# Patient Record
Sex: Male | Born: 1950 | Race: White | Hispanic: No | Smoking: Current every day smoker
Health system: Southern US, Community
[De-identification: ages and names within clinical notes are randomized; demographics above are authoritative.]

## PROBLEM LIST (undated history)

## (undated) DIAGNOSIS — H9190 Unspecified hearing loss, unspecified ear: Secondary | ICD-10-CM

## (undated) DIAGNOSIS — E785 Hyperlipidemia, unspecified: Secondary | ICD-10-CM

## (undated) DIAGNOSIS — R7303 Prediabetes: Secondary | ICD-10-CM

## (undated) DIAGNOSIS — K859 Acute pancreatitis without necrosis or infection, unspecified: Secondary | ICD-10-CM

## (undated) DIAGNOSIS — C259 Malignant neoplasm of pancreas, unspecified: Secondary | ICD-10-CM

## (undated) DIAGNOSIS — K219 Gastro-esophageal reflux disease without esophagitis: Secondary | ICD-10-CM

## (undated) HISTORY — DX: Hyperlipidemia, unspecified: E78.5

## (undated) HISTORY — PX: TONSILLECTOMY: SUR1361

## (undated) HISTORY — PX: APPENDECTOMY: SHX54

## (undated) HISTORY — DX: Acute pancreatitis without necrosis or infection, unspecified: K85.90

## (undated) HISTORY — PX: COLONOSCOPY: SHX174

## (undated) HISTORY — DX: Malignant neoplasm of pancreas, unspecified: C25.9

## (undated) HISTORY — PX: POLYPECTOMY: SHX149

---

## 2004-09-19 ENCOUNTER — Ambulatory Visit (HOSPITAL_COMMUNITY): Admission: RE | Admit: 2004-09-19 | Discharge: 2004-09-19 | Payer: Self-pay | Admitting: Family Medicine

## 2004-10-12 ENCOUNTER — Ambulatory Visit: Payer: Self-pay | Admitting: Gastroenterology

## 2004-10-26 ENCOUNTER — Ambulatory Visit: Payer: Self-pay | Admitting: Gastroenterology

## 2007-03-07 ENCOUNTER — Ambulatory Visit (HOSPITAL_COMMUNITY): Admission: RE | Admit: 2007-03-07 | Discharge: 2007-03-07 | Payer: Self-pay | Admitting: Family Medicine

## 2009-11-10 ENCOUNTER — Encounter (INDEPENDENT_AMBULATORY_CARE_PROVIDER_SITE_OTHER): Payer: Self-pay | Admitting: *Deleted

## 2009-11-30 ENCOUNTER — Ambulatory Visit (HOSPITAL_COMMUNITY): Admission: RE | Admit: 2009-11-30 | Discharge: 2009-11-30 | Payer: Self-pay | Admitting: Family Medicine

## 2010-07-19 NOTE — Letter (Signed)
Summary: Colonoscopy Date Change Letter  [facility]  [address] [place], [place] [address]   Phone: [phone]  Fax: [phone]      Nov 10, 2009 MRN: [id number]   Jon Pena [address] [place], [place]  [address]   Dear Jon Pena,   Previously you were recommended to have a repeat colonoscopy around this time. Your chart was recently reviewed by Dr. Sheryn Bison of [facility]. Follow up colonoscopy is now recommended in May 2016. This revised recommendation is based on current, nationally recognized guidelines for colorectal cancer screening and polyp surveillance. These guidelines are endorsed by the American Cancer Society, [employer] on Colorectal Cancer as well as numerous other major medical organizations.  Please understand that our recommendation assumes that you do not have any new symptoms such as bleeding, a change in bowel habits, anemia, or significant abdominal discomfort. If you do have any concerning GI symptoms or want to discuss the guideline recommendations, please call to arrange an office visit at your earliest convenience. Otherwise we will keep you in our reminder system and contact you 1-2 months prior to the date listed above to schedule your next colonoscopy.  Thank you,    Vania Rea. Jarold Motto, M.D.  [facility] [phone]

## 2012-04-06 ENCOUNTER — Ambulatory Visit (INDEPENDENT_AMBULATORY_CARE_PROVIDER_SITE_OTHER): Payer: BC Managed Care – PPO | Admitting: Physician Assistant

## 2012-04-06 VITALS — BP 132/92 | HR 84 | Temp 98.2°F | Resp 17 | Ht 71.0 in | Wt 216.0 lb

## 2012-04-06 DIAGNOSIS — Z4802 Encounter for removal of sutures: Secondary | ICD-10-CM

## 2012-04-06 NOTE — Progress Notes (Signed)
   Patient ID: Jon Pena MRN: [id number], DOB: [birth year] 61 y.o. Date of Encounter: 04/06/2012, 5:30 PM  Primary Physician: No primary provider on file.  Chief Complaint: Suture removal     HPI: 61 y.o. y/o male with injury to left proximal forearm.  Here for suture removal s/p placement on 03/17/12 at [facility]. Patient unfortunately suffered a laceration to the left forearm by a chainsaw on 03/17/12. He was airlifted by [employer] to [facility]. Upon arrival to [facility] it was found that he did not have involvement of any deep structures. He has full range of motion of the affected extremity, full strength, and normal sensation. He even went to play golf on 04/05/12 and "shot a 75." There were 11 simple interrupted Ethilon sutures placed. No subcutaneous sutures were placed. Tetanus up to date.  Doing well No issues/complaints Afebrile/ No chills No erythema No pain Able to move without difficulty Normal sensation  No past medical history on file.   Home Meds: Prior to Admission medications   Medication Sig Start Date End Date Taking? Authorizing Provider  cephALEXin (KEFLEX) 500 MG capsule Take 500 mg by mouth 4 (four) times daily.   Yes Historical Provider, MD    Allergies: No Known Allergies  Physical Exam: Blood pressure 132/92, pulse 84, temperature 98.2 F (36.8 C), temperature source Oral, resp. rate 17, height 5\' 11"  (1.803 m), weight 216 lb (97.977 kg), SpO2 96.00%., Body mass index is 30.13 kg/(m^2). General: Well developed, well nourished, in no acute distress. Head: Normocephalic, atraumatic, sclera non-icteric, no xanthomas, nares are without discharge.  Neck: Supple. Lungs: Breathing is unlabored. Heart: Normal rate. Msk:  Strength and tone appear normal for age. Wound: Wound well healed without erythema, swelling, or tenderness to palpation. FROM and 5/5 strength with normal sensation throughout including 2 point discrimination. Skin:  See above, otherwise dry without rash or erythema. Extremities: No clubbing or cyanosis. No edema. Neuro: Alert and oriented X 3. Moves all extremities spontaneously.  Psych:  Responds to questions appropriately with a normal affect.   PROCEDURE: Verbal consent obtained. 11 simple interrupted sutures removed without difficulty.  Assessment and Plan: 61 y.o. y/o male here for suture removal for wound described above. -Sutures removed per above -Wound resolved -RTC prn  Signed, Eula Listen, PA-C 04/06/2012 5:30 PM

## 2013-01-26 ENCOUNTER — Emergency Department (HOSPITAL_COMMUNITY): Payer: BC Managed Care – PPO | Admitting: Anesthesiology

## 2013-01-26 ENCOUNTER — Encounter (HOSPITAL_COMMUNITY): Payer: Self-pay | Admitting: *Deleted

## 2013-01-26 ENCOUNTER — Encounter (HOSPITAL_COMMUNITY)
Admission: EM | Disposition: A | Discharge status: Home / Self Care | Payer: Self-pay | Source: Home / Self Care | Attending: Emergency Medicine

## 2013-01-26 ENCOUNTER — Encounter (HOSPITAL_COMMUNITY): Payer: Self-pay | Admitting: Anesthesiology

## 2013-01-26 ENCOUNTER — Emergency Department (HOSPITAL_COMMUNITY): Payer: BC Managed Care – PPO

## 2013-01-26 ENCOUNTER — Observation Stay (HOSPITAL_COMMUNITY)
Admission: EM | Admit: 2013-01-26 | Discharge: 2013-01-27 | Disposition: A | Discharge status: Home / Self Care | Payer: BC Managed Care – PPO | Attending: General Surgery | Admitting: General Surgery

## 2013-01-26 DIAGNOSIS — K3580 Unspecified acute appendicitis: Secondary | ICD-10-CM

## 2013-01-26 DIAGNOSIS — K429 Umbilical hernia without obstruction or gangrene: Secondary | ICD-10-CM | POA: Insufficient documentation

## 2013-01-26 DIAGNOSIS — K8689 Other specified diseases of pancreas: Secondary | ICD-10-CM

## 2013-01-26 DIAGNOSIS — R1031 Right lower quadrant pain: Secondary | ICD-10-CM | POA: Insufficient documentation

## 2013-01-26 DIAGNOSIS — K869 Disease of pancreas, unspecified: Secondary | ICD-10-CM | POA: Insufficient documentation

## 2013-01-26 HISTORY — PX: LAPAROSCOPIC APPENDECTOMY: SHX408

## 2013-01-26 LAB — COMPREHENSIVE METABOLIC PANEL
Albumin: 3.8 g/dL (ref 3.5–5.2)
Alkaline Phosphatase: 68 U/L (ref 39–117)
Creatinine, Ser: 0.88 mg/dL (ref 0.50–1.35)
GFR calc Af Amer: >90 mL/min (ref >90)
GFR calc non Af Amer: >90 mL/min (ref >90)
Glucose, Bld: 130 mg/dL — ABNORMAL HIGH (ref 70–99)
Potassium: 4.2 mEq/L (ref 3.5–5.1)
Sodium: 137 mEq/L (ref 135–145)
Total Bilirubin: 0.4 mg/dL (ref 0.3–1.2)

## 2013-01-26 LAB — URINALYSIS, ROUTINE W REFLEX MICROSCOPIC
Glucose, UA: NEGATIVE mg/dL
Hgb urine dipstick: NEGATIVE
Protein, ur: NEGATIVE mg/dL
Urobilinogen, UA: 0.2 mg/dL (ref 0.0–1.0)

## 2013-01-26 LAB — CBC WITH DIFFERENTIAL/PLATELET
Hemoglobin: 16.7 g/dL (ref 13.0–17.0)
MCH: 31.5 pg (ref 26.0–34.0)
Monocytes Absolute: 0.9 10*3/uL (ref 0.1–1.0)
Platelets: 196 10*3/uL (ref 150–400)
RBC: 5.3 MIL/uL (ref 4.22–5.81)

## 2013-01-26 SURGERY — APPENDECTOMY, LAPAROSCOPIC
Anesthesia: General | Wound class: Contaminated

## 2013-01-26 MED ORDER — HEPARIN SODIUM (PORCINE) 5000 UNIT/ML IJ SOLN
5000.0000 [IU] | Freq: Three times a day (TID) | INTRAMUSCULAR | Status: DC
  Administered 2013-01-26 – 2013-01-27 (×2): 5000 [IU] via SUBCUTANEOUS
  Filled 2013-01-26 (×5): qty 1

## 2013-01-26 MED ORDER — ROCURONIUM BROMIDE 100 MG/10ML IV SOLN
INTRAVENOUS | Status: DC | PRN
  Administered 2013-01-26: 40 mg via INTRAVENOUS

## 2013-01-26 MED ORDER — GLYCOPYRROLATE 0.2 MG/ML IJ SOLN
INTRAMUSCULAR | Status: DC | PRN
  Administered 2013-01-26: 0.6 mg via INTRAVENOUS

## 2013-01-26 MED ORDER — IOHEXOL 300 MG/ML  SOLN
25.0000 mL | INTRAMUSCULAR | Status: DC | PRN
  Administered 2013-01-26: 25 mL via ORAL

## 2013-01-26 MED ORDER — ONDANSETRON HCL 4 MG/2ML IJ SOLN
INTRAMUSCULAR | Status: DC | PRN
  Administered 2013-01-26: 4 mg via INTRAVENOUS

## 2013-01-26 MED ORDER — HYDROMORPHONE HCL PF 1 MG/ML IJ SOLN
1.0000 mg | Freq: Once | INTRAMUSCULAR | Status: AC
  Administered 2013-01-26: 1 mg via INTRAVENOUS
  Filled 2013-01-26: qty 1

## 2013-01-26 MED ORDER — ALBUMIN HUMAN 5 % IV SOLN
INTRAVENOUS | Status: DC | PRN
  Administered 2013-01-26: 11:00:00 via INTRAVENOUS

## 2013-01-26 MED ORDER — NEOSTIGMINE METHYLSULFATE 1 MG/ML IJ SOLN
INTRAMUSCULAR | Status: DC | PRN
  Administered 2013-01-26: 4 mg via INTRAVENOUS

## 2013-01-26 MED ORDER — SODIUM CHLORIDE 0.9 % IV SOLN: INTRAVENOUS | Status: DC

## 2013-01-26 MED ORDER — FENTANYL CITRATE 0.05 MG/ML IJ SOLN
INTRAMUSCULAR | Status: DC | PRN
  Administered 2013-01-26: 50 ug via INTRAVENOUS
  Administered 2013-01-26: 100 ug via INTRAVENOUS

## 2013-01-26 MED ORDER — IOHEXOL 300 MG/ML  SOLN
125.0000 mL | Freq: Once | INTRAMUSCULAR | Status: AC | PRN
  Administered 2013-01-26: 125 mL via INTRAVENOUS

## 2013-01-26 MED ORDER — LACTATED RINGERS IV SOLN
INTRAVENOUS | Status: DC | PRN
  Administered 2013-01-26 (×2): via INTRAVENOUS

## 2013-01-26 MED ORDER — PHENYLEPHRINE HCL 10 MG/ML IJ SOLN
INTRAMUSCULAR | Status: DC | PRN
  Administered 2013-01-26 (×5): 80 ug via INTRAVENOUS

## 2013-01-26 MED ORDER — SODIUM CHLORIDE 0.9 % IV BOLUS (SEPSIS)
1000.0000 mL | Freq: Once | INTRAVENOUS | Status: AC
  Administered 2013-01-26: 1000 mL via INTRAVENOUS

## 2013-01-26 MED ORDER — ONDANSETRON HCL 4 MG/2ML IJ SOLN: 4.0000 mg | Freq: Four times a day (QID) | INTRAMUSCULAR | Status: DC | PRN

## 2013-01-26 MED ORDER — LIDOCAINE HCL (CARDIAC) 20 MG/ML IV SOLN
INTRAVENOUS | Status: DC | PRN
  Administered 2013-01-26: 50 mg via INTRAVENOUS

## 2013-01-26 MED ORDER — MIDAZOLAM HCL 5 MG/5ML IJ SOLN
INTRAMUSCULAR | Status: DC | PRN
  Administered 2013-01-26: 2 mg via INTRAVENOUS

## 2013-01-26 MED ORDER — SODIUM CHLORIDE 0.9 % IR SOLN
Status: DC | PRN
  Administered 2013-01-26: 1000 mL

## 2013-01-26 MED ORDER — SUCCINYLCHOLINE CHLORIDE 20 MG/ML IJ SOLN
INTRAMUSCULAR | Status: DC | PRN
  Administered 2013-01-26: 120 mg via INTRAVENOUS

## 2013-01-26 MED ORDER — IBUPROFEN 600 MG PO TABS
600.0000 mg | ORAL_TABLET | Freq: Four times a day (QID) | ORAL | Status: DC | PRN
  Filled 2013-01-26: qty 1

## 2013-01-26 MED ORDER — PROPOFOL 10 MG/ML IV BOLUS
INTRAVENOUS | Status: DC | PRN
  Administered 2013-01-26: 180 mg via INTRAVENOUS

## 2013-01-26 MED ORDER — BUPIVACAINE-EPINEPHRINE PF 0.25-1:200000 % IJ SOLN
INTRAMUSCULAR | Status: AC
  Filled 2013-01-26: qty 30

## 2013-01-26 MED ORDER — OXYCODONE-ACETAMINOPHEN 5-325 MG PO TABS
ORAL_TABLET | ORAL | Status: AC
  Filled 2013-01-26: qty 2

## 2013-01-26 MED ORDER — ONDANSETRON HCL 4 MG PO TABS: 4.0000 mg | ORAL_TABLET | Freq: Four times a day (QID) | ORAL | Status: DC | PRN

## 2013-01-26 MED ORDER — KETOROLAC TROMETHAMINE 30 MG/ML IJ SOLN
INTRAMUSCULAR | Status: DC | PRN
  Administered 2013-01-26: 30 mg via INTRAVENOUS

## 2013-01-26 MED ORDER — SODIUM CHLORIDE 0.9 % IV SOLN
1.0000 g | Freq: Once | INTRAVENOUS | Status: AC
  Administered 2013-01-26: 1 g via INTRAVENOUS
  Filled 2013-01-26: qty 1

## 2013-01-26 MED ORDER — MORPHINE SULFATE 2 MG/ML IJ SOLN
2.0000 mg | INTRAMUSCULAR | Status: DC | PRN
  Administered 2013-01-27 (×2): 2 mg via INTRAVENOUS
  Filled 2013-01-26 (×2): qty 1

## 2013-01-26 MED ORDER — EPHEDRINE SULFATE 50 MG/ML IJ SOLN
INTRAMUSCULAR | Status: DC | PRN
  Administered 2013-01-26: 10 mg via INTRAVENOUS

## 2013-01-26 MED ORDER — OXYCODONE-ACETAMINOPHEN 5-325 MG PO TABS
1.0000 | ORAL_TABLET | ORAL | Status: DC | PRN
  Administered 2013-01-26: 1 via ORAL
  Administered 2013-01-26 – 2013-01-27 (×3): 2 via ORAL
  Filled 2013-01-26 (×4): qty 2

## 2013-01-26 MED ORDER — BUPIVACAINE-EPINEPHRINE 0.25% -1:200000 IJ SOLN
INTRAMUSCULAR | Status: DC | PRN
  Administered 2013-01-26: 15 mL

## 2013-01-26 MED ORDER — ONDANSETRON HCL 4 MG/2ML IJ SOLN
4.0000 mg | Freq: Once | INTRAMUSCULAR | Status: AC
  Administered 2013-01-26: 4 mg via INTRAVENOUS
  Filled 2013-01-26: qty 2

## 2013-01-26 SURGICAL SUPPLY — 50 items
ADH SKN CLS APL DERMABOND .7 (GAUZE/BANDAGES/DRESSINGS) ×1
APPLIER CLIP ROT 10 11.4 M/L (STAPLE)
APR CLP MED LRG 11.4X10 (STAPLE)
BAG SPEC RTRVL LRG 6X4 10 (ENDOMECHANICALS) ×1
BLADE SURG ROTATE 9660 (MISCELLANEOUS) ×1 IMPLANT
CANISTER SUCTION 2500CC (MISCELLANEOUS) ×2 IMPLANT
CHLORAPREP W/TINT 26ML (MISCELLANEOUS) ×2 IMPLANT
CLIP APPLIE ROT 10 11.4 M/L (STAPLE) IMPLANT
CLOTH BEACON ORANGE TIMEOUT ST (SAFETY) ×2 IMPLANT
COVER SURGICAL LIGHT HANDLE (MISCELLANEOUS) ×2 IMPLANT
CUTTER LINEAR ENDO 35 ETS (STAPLE) IMPLANT
CUTTER LINEAR ENDO 35 ETS TH (STAPLE) ×1 IMPLANT
DECANTER SPIKE VIAL GLASS SM (MISCELLANEOUS) ×1 IMPLANT
DERMABOND ADVANCED (GAUZE/BANDAGES/DRESSINGS) ×1
DERMABOND ADVANCED .7 DNX12 (GAUZE/BANDAGES/DRESSINGS) ×1 IMPLANT
DRAPE UTILITY 15X26 W/TAPE STR (DRAPE) ×4 IMPLANT
ELECT REM PT RETURN 9FT ADLT (ELECTROSURGICAL) ×2
ELECTRODE REM PT RTRN 9FT ADLT (ELECTROSURGICAL) ×1 IMPLANT
ENDOLOOP SUT PDS II  0 18 (SUTURE)
ENDOLOOP SUT PDS II 0 18 (SUTURE) IMPLANT
GLOVE BIO SURGEON STRL SZ8 (GLOVE) ×2 IMPLANT
GLOVE BIOGEL PI IND STRL 8 (GLOVE) ×1 IMPLANT
GLOVE BIOGEL PI INDICATOR 8 (GLOVE) ×1
GOWN STRL NON-REIN LRG LVL3 (GOWN DISPOSABLE) ×3 IMPLANT
GOWN STRL REIN XL XLG (GOWN DISPOSABLE) ×2 IMPLANT
KIT BASIN OR (CUSTOM PROCEDURE TRAY) ×2 IMPLANT
KIT ROOM TURNOVER OR (KITS) ×2 IMPLANT
NEEDLE 22X1 1/2 (OR ONLY) (NEEDLE) ×2 IMPLANT
NS IRRIG 1000ML POUR BTL (IV SOLUTION) ×2 IMPLANT
PAD ARMBOARD 7.5X6 YLW CONV (MISCELLANEOUS) ×4 IMPLANT
POUCH SPECIMEN RETRIEVAL 10MM (ENDOMECHANICALS) ×2 IMPLANT
RELOAD /EVU35 (ENDOMECHANICALS) IMPLANT
RELOAD CUTTER ETS 35MM STAND (ENDOMECHANICALS) IMPLANT
SCALPEL HARMONIC ACE (MISCELLANEOUS) ×2 IMPLANT
SET IRRIG TUBING LAPAROSCOPIC (IRRIGATION / IRRIGATOR) ×2 IMPLANT
SPECIMEN JAR SMALL (MISCELLANEOUS) ×2 IMPLANT
SUT NOVA NAB GS-21 0 18 T12 DT (SUTURE) ×1 IMPLANT
SUT VIC AB 2-0 SH 27 (SUTURE) ×2
SUT VIC AB 2-0 SH 27XBRD (SUTURE) IMPLANT
SUT VIC AB 4-0 PS2 27 (SUTURE) ×2 IMPLANT
SWAB COLLECTION DEVICE MRSA (MISCELLANEOUS) IMPLANT
TOWEL OR 17X24 6PK STRL BLUE (TOWEL DISPOSABLE) ×2 IMPLANT
TOWEL OR 17X26 10 PK STRL BLUE (TOWEL DISPOSABLE) ×2 IMPLANT
TRAY FOLEY CATH 16FR SILVER (SET/KITS/TRAYS/PACK) ×1 IMPLANT
TRAY LAPAROSCOPIC (CUSTOM PROCEDURE TRAY) ×2 IMPLANT
TROCAR XCEL 12X100 BLDLESS (ENDOMECHANICALS) ×2 IMPLANT
TROCAR XCEL BLUNT TIP 100MML (ENDOMECHANICALS) ×2 IMPLANT
TROCAR XCEL NON-BLD 5MMX100MML (ENDOMECHANICALS) ×2 IMPLANT
TUBE ANAEROBIC SPECIMEN COL (MISCELLANEOUS) IMPLANT
WATER STERILE IRR 1000ML POUR (IV SOLUTION) ×1 IMPLANT

## 2013-01-26 NOTE — Preoperative (Signed)
Beta Blockers   Reason not to administer Beta Blockers:Not Applicable 

## 2013-01-26 NOTE — Op Note (Signed)
01/26/2013  12:47 PM  PATIENT:  Jon Pena  62 y.o. male  PRE-OPERATIVE DIAGNOSIS:  Appendicitis, umbilical hernia  POST-OPERATIVE DIAGNOSIS:  same  PROCEDURE:  Procedure(s): APPENDECTOMY LAPAROSCOPIC, PRIMARY REPAIR UMBILICAL HERNIA  SURGEON:  Surgeon(s): Liz Malady, MD  PHYSICIAN ASSISTANT:   ASSISTANTS: none   ANESTHESIA:   local and general  EBL:  Total I/O In: 1550 [I.V.:1300; IV Piggyback:250] Out: 50 [Blood:50]  BLOOD ADMINISTERED:none  DRAINS: none   SPECIMEN:  Excision  DISPOSITION OF SPECIMEN:  PATHOLOGY  COUNTS:  YES  DICTATION: Reubin Milan Dictation  Patient presented to the emergency room with abdominal pain. Workup demonstrated acute appendicitis. He is also noted to have a small umbilical hernia. 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 Informed consent was obtained. Patient received intravenous antibiotics. He was brought to the operating room and general endotracheal anesthesia was a Optician, dispensing by the anesthesia staff. Time out procedure was done. His abdomen was prepped and draped in sterile fashion. Infraumbilical region was infiltrated with local. Infraumbilical incision was made and subcutaneous tissues were dissected down revealing a small umbilical hernia. This was dissected freeing up the umbilical skin from the underlying hernia. Fascial edges were dissected. It contained a small amount of omentum which spontaneously reduced. A 0 Vicryl pursestring was placed around the opening in the [place]ain trocar was inserted. Abdomen was insufflated with carbon dioxide in standard fashion. Under direct vision, a 12 mm left lower quadrant a 5 mm right abdominal port were placed. Local was used at each port site. Laparoscopic exploration revealed Nothing obviously abnormal In the left upper quadrant or on the peritoneal surface.  Attention was directed to the right lower quadrant. The appendix was located and it was separated differential but not frankly perforated. The mesoappendix was divided with the harmonic scalpel achieving excellent hemostasis. Base of the appendix was then skeletonized and divided with Endo GIA with vascular load. Appendix was placed in an Endo Catch bag and removed from the left lower quadrant port site. Abdomen was copiously irrigated. Irrigation fluid returned clear. There was good hemostasis. Staple line was intact. Ports were removed under direct vision, the pneumoperitoneum was released. Left lower quadrant port site fascia was closed with a single 0 Novafil. The Umbilical hernia defect was then closed with interrupted 0 Novafil sutures. All wounds were irrigated.Umbilical skin was tacked to underlying tissues with 2-0 Vicryl. Skin of each wound was then closed with running 4-0 Vicryl subcuticular stitch followed by Dermabond.All counts were correct. Patient tolerated procedure well without apparent complication and was taken recovery in stable condition. We'll check tumor markers for his pancreatic mass.  PATIENT DISPOSITION:  PACU - hemodynamically stable.   Delay start of Pharmacological VTE agent (>24hrs) due to surgical blood loss or risk of bleeding:  no  Violeta Gelinas, MD, MPH, FACS Pager: [phone]  8/10/201412:47 PM

## 2013-01-26 NOTE — Anesthesia Procedure Notes (Signed)
Procedure Name: Intubation Date/Time: 01/26/2013 11:06 AM Performed by: Leona Singleton A Pre-anesthesia Checklist: Patient identified, Emergency Drugs available, Suction available and Patient being monitored Patient Re-evaluated:Patient Re-evaluated prior to inductionOxygen Delivery Method: Circle system utilized Preoxygenation: Pre-oxygenation with 100% oxygen Intubation Type: IV induction, Rapid sequence and Cricoid Pressure applied Laryngoscope Size: Miller and 2 Grade View: Grade I Tube type: Oral Tube size: 7.5 mm Number of attempts: 1 Airway Equipment and Method: Stylet Placement Confirmation: ETT inserted through vocal cords under direct vision,  positive ETCO2 and breath sounds checked- equal and bilateral Secured at: 21 cm Tube secured with: Tape Dental Injury: Teeth and Oropharynx as per pre-operative assessment

## 2013-01-26 NOTE — Anesthesia Preprocedure Evaluation (Addendum)
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   Endo/Other  negative endocrine ROS  Renal/GU negative Renal ROS     Musculoskeletal negative musculoskeletal ROS (+)   Abdominal   Peds  Hematology negative hematology ROS (+)   Anesthesia Other Findings   Reproductive/Obstetrics                          Anesthesia Physical Anesthesia Plan  ASA: II and emergent  Anesthesia Plan: General   Post-op Pain Management:    Induction: Intravenous, Rapid sequence and Cricoid pressure planned  Airway Management Planned: Oral ETT  Additional Equipment:   Intra-op Plan:   Post-operative Plan: Extubation in OR  Informed Consent: I have reviewed the patients History and Physical, chart, labs and discussed the procedure including the risks, benefits and alternatives for the proposed anesthesia with the patient or authorized representative who has indicated his/her understanding and acceptance.   Dental advisory given  Plan Discussed with: CRNA and Anesthesiologist  Anesthesia Plan Comments:         Anesthesia Quick Evaluation

## 2013-01-26 NOTE — Transfer of Care (Signed)
Immediate Anesthesia Transfer of Care Note  Patient: Jon Pena  Procedure(s) Performed: Procedure(s): APPENDECTOMY LAPAROSCOPIC (N/A)  Patient Location: PACU  Anesthesia Type:General  Level of Consciousness: sedated and patient cooperative  Airway & Oxygen Therapy: Patient Spontanous Breathing and Patient connected to nasal cannula oxygen  Post-op Assessment: Report given to PACU RN and Post -op Vital signs reviewed and stable  Post vital signs: Reviewed and stable  Complications: No apparent anesthesia complications

## 2013-01-26 NOTE — Anesthesia Postprocedure Evaluation (Signed)
  Anesthesia Post-op Note  Patient: Jon Pena  Procedure(s) Performed: Procedure(s): APPENDECTOMY LAPAROSCOPIC (N/A)  Patient Location: PACU  Anesthesia Type:General  Level of Consciousness: awake, alert  and oriented  Airway and Oxygen Therapy: Patient Spontanous Breathing  Post-op Pain: none  Post-op Assessment: Post-op Vital signs reviewed, Patient's Cardiovascular Status Stable, Respiratory Function Stable and Pain level controlled  Post-op Vital Signs: stable  Complications: No apparent anesthesia complications

## 2013-01-26 NOTE — ED Notes (Signed)
abd pain since 2300 last pm no nv.  C/o severe pain  This am

## 2013-01-26 NOTE — H&P (Signed)
Jon Pena is an 62 y.o. male.   Chief Complaint: Right lower quadrant abdominal pain HPI: Patient developed right-sided abdominal pain yesterday at 10 PM. It gives him difficulty with sleeping. The pain is more localized to the right lower quadrant. It persisted this morning so he came to the emergency department for evaluation. He was found to have leukocytosis. CT scan of the abdomen and pelvis demonstrates acute appendicitis. It also shows an abnormality in the tail of his pancreas concerning for a mass versus focal pancreatitis. He does not have a history of pancreatitis. His father, however, passed away from pancreatitis in his 83s.  History reviewed. No pertinent past medical history.  History reviewed. No pertinent past surgical history.  No family history on file. Social History:  reports that he has been smoking.  He does not have any smokeless tobacco history on file. He reports that  drinks alcohol. His drug history is not on file.  Allergies: No Known Allergies   (Not in a hospital admission)  Results for orders placed during the hospital encounter of 01/26/13 (from the past 48 hour(s))  CBC WITH DIFFERENTIAL     Status: Abnormal   Collection Time    01/26/13  7:25 AM      Result Value Range   WBC 14.6 (*) 4.0 - 10.5 K/uL   RBC 5.30  4.22 - 5.81 MIL/uL   Hemoglobin 16.7  13.0 - 17.0 g/dL   HCT 16.1  09.6 - 04.5 %   MCV 87.5  78.0 - 100.0 fL   MCH 31.5  26.0 - 34.0 pg   MCHC 36.0  30.0 - 36.0 g/dL   RDW 40.9  81.1 - 91.4 %   Platelets 196  150 - 400 K/uL   Neutrophils Relative % 85 (*) 43 - 77 %   Neutro Abs 12.4 (*) 1.7 - 7.7 K/uL   Lymphocytes Relative 8 (*) 12 - 46 %   Lymphs Abs 1.2  0.7 - 4.0 K/uL   Monocytes Relative 6  3 - 12 %   Monocytes Absolute 0.9  0.1 - 1.0 K/uL   Eosinophils Relative 1  0 - 5 %   Eosinophils Absolute 0.2  0.0 - 0.7 K/uL   Basophils Relative 0  0 - 1 %   Basophils Absolute 0.0  0.0 - 0.1 K/uL  COMPREHENSIVE METABOLIC PANEL      Status: Abnormal   Collection Time    01/26/13  7:25 AM      Result Value Range   Sodium 137  135 - 145 mEq/L   Potassium 4.2  3.5 - 5.1 mEq/L   Chloride 101  96 - 112 mEq/L   CO2 24  19 - 32 mEq/L   Glucose, Bld 130 (*) 70 - 99 mg/dL   BUN 21  6 - 23 mg/dL   Creatinine, Ser 7.82  0.50 - 1.35 mg/dL   Calcium 9.0  8.4 - 95.6 mg/dL   Total Protein 6.8  6.0 - 8.3 g/dL   Albumin 3.8  3.5 - 5.2 g/dL   AST 22  0 - 37 U/L   ALT 20  0 - 53 U/L   Alkaline Phosphatase 68  39 - 117 U/L   Total Bilirubin 0.4  0.3 - 1.2 mg/dL   GFR calc non Af Amer >90  >90 mL/min   GFR calc Af Amer >90  >90 mL/min   Comment:            The eGFR  has been calculated     using the CKD EPI equation.     This calculation has not been     validated in all clinical     situations.     eGFR's persistently     <90 mL/min signify     possible Chronic Kidney Disease.  LIPASE, BLOOD     Status: None   Collection Time    01/26/13  7:25 AM      Result Value Range   Lipase 18  11 - 59 U/L  URINALYSIS, ROUTINE W REFLEX MICROSCOPIC     Status: None   Collection Time    01/26/13  7:55 AM      Result Value Range   Color, Urine YELLOW  YELLOW   APPearance CLEAR  CLEAR   Specific Gravity, Urine 1.025  1.005 - 1.030   pH 5.5  5.0 - 8.0   Glucose, UA NEGATIVE  NEGATIVE mg/dL   Hgb urine dipstick NEGATIVE  NEGATIVE   Bilirubin Urine NEGATIVE  NEGATIVE   Ketones, ur NEGATIVE  NEGATIVE mg/dL   Protein, ur NEGATIVE  NEGATIVE mg/dL   Urobilinogen, UA 0.2  0.0 - 1.0 mg/dL   Nitrite NEGATIVE  NEGATIVE   Leukocytes, UA NEGATIVE  NEGATIVE   Comment: MICROSCOPIC NOT DONE ON URINES WITH NEGATIVE PROTEIN, BLOOD, LEUKOCYTES, NITRITE, OR GLUCOSE <1000 mg/dL.   Ct Abdomen Pelvis W Contrast  01/26/2013   *RADIOLOGY REPORT*  Clinical Data: Sudden onset of abdominal pain last night.  Elevated white count.  CT ABDOMEN AND PELVIS WITH CONTRAST  Technique:  Multidetector CT imaging of the abdomen and pelvis was performed following the  standard protocol during bolus administration of intravenous contrast.  Contrast: 25mL OMNIPAQUE IOHEXOL 300 MG/ML  SOLN, OMNIPAQUE IOHEXOL 300 MG/ML  SOLN  Comparison: None.  Findings: Inflamed enlarged appendix measuring up to 10.8 cm consistent with appendicitis without adjacent abscess.  The appendix is located anterior to the cecum.  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  Malignancy is a distinct possibility (less likely consideration is a result of pancreatitis).  No surrounding adenopathy.  No splenic vein thrombosis.  Mild fatty infiltration of the liver without focal worrisome mass.  Left renal 2.8 cm cyst.  Right renal sub centimeter low density structure may be cyst although too small to characterize.  No splenic or adrenal lesion.  Tiny gallstones.  Atherosclerotic type changes mid to lower abdominal aorta without aneurysmal dilation.  Atherosclerotic type changes iliac arteries without high-grade stenosis or aneurysm.  No bony destructive lesion.  Degenerative changes lower lumbar spine.  Basilar subsegmental atelectatic changes.  Cardiomegaly.  Noncontrast filled views urinary bladder unremarkable.  Partial calcification of prostate gland without to enlargement.  IMPRESSION: Inflamed enlarged appendix measuring up to 10.8 cm consistent with appendicitis without adjacent abscess.  The appendix is located anterior to the cecum.  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  Malignancy is a distinct possibility (less likely consideration is a result of focal pancreatitis).  No surrounding adenopathy.  No splenic vein thrombosis.  Mild fatty infiltration of the liver without focal worrisome mass.  Left renal 2.8 cm cyst.  Right renal sub centimeter low density structure may be a cyst although too small to characterize.  Tiny gallstones.  Atherosclerotic type changes mid to lower abdominal aorta without aneurysmal dilation.  Atherosclerotic type changes iliac arteries without high-grade stenosis or  aneurysm.  No bony destructive lesion.  Degenerative changes lower lumbar spine.  Cardiomegaly.  Critical Value/emergent results were called by telephone at the time of interpretation on 01/26/2013 at 9:04 a.m. to Dr. Effie Shy, who verbally acknowledged these results.   Original Report Authenticated By: Lacy Duverney, M.D.    Review of Systems  Constitutional: Negative.   HENT: Negative.   Eyes: Negative.   Respiratory: Negative.   Cardiovascular: Negative.   Gastrointestinal: Positive for abdominal pain. Negative for nausea and vomiting.  Genitourinary: Negative.   Musculoskeletal: Negative.   Skin: Negative.   Neurological: Negative.   Endo/Heme/Allergies: Negative.   Psychiatric/Behavioral: Negative.     Blood pressure 118/71, pulse 80, temperature 97.9 F (36.6 C), temperature source Oral, resp. rate 18, height 5\' 11"  (1.803 m), weight 96.163 kg (212 lb), SpO2 96.00%. Physical Exam  Constitutional: He is oriented to person, place, and time. He appears well-developed and well-nourished. No distress.  HENT:  Head: Normocephalic and atraumatic.  Mouth/Throat: Oropharynx is clear and moist. No oropharyngeal exudate.  Eyes: EOM are normal. Pupils are equal, round, and reactive to light. No scleral icterus.  Neck: Normal range of motion. Neck supple. No tracheal deviation present.  Cardiovascular: Normal rate, regular rhythm, normal heart sounds and intact distal pulses.   No murmur heard. Respiratory: Breath sounds normal. No stridor. No respiratory distress. He has no wheezes. He has no rales.  GI: Soft. He exhibits no distension. There is tenderness. There is no rebound and no guarding.  Reducible umbilical hernia, tender right lower quadrant without guarding  Musculoskeletal: Normal range of motion.  Neurological: He is alert and oriented to person, place, and time. He exhibits normal muscle tone.  Skin: Skin is warm.  Psychiatric: He has a normal mood and affect.      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 We'll plan to go to the operating room for laparoscopic appendectomy. Procedure, risks, and benefits were discussed in detail with the patient. He agrees. We may be able to primarily repair his umbilical hernia at that time. We will not be able to place any mesh. In light of his pancreatic tail mass, will check tumor markers here in the hospital. [facility] coordinate further followup as an outpatient.  Jetson Pickrel E 01/26/2013, 9:59 AM

## 2013-01-26 NOTE — Anesthesia Postprocedure Evaluation (Signed)
  Anesthesia Post-op Note  Patient: Jon Pena  Procedure(s) Performed: Procedure(s): APPENDECTOMY LAPAROSCOPIC (N/A)  Patient Location: PACU  Anesthesia Type:General  Level of Consciousness: awake, alert , oriented and patient cooperative  Airway and Oxygen Therapy: Patient Spontanous Breathing and Patient connected to nasal cannula oxygen  Post-op Pain: none  Post-op Assessment: Post-op Vital signs reviewed, Patient's Cardiovascular Status Stable, Respiratory Function Stable, Patent Airway, No signs of Nausea or vomiting, Adequate PO intake and Pain level controlled  Post-op Vital Signs: Reviewed and stable  Complications: No apparent anesthesia complications

## 2013-01-26 NOTE — ED Provider Notes (Signed)
CSN: [id number]     Arrival date & time 01/26/13  0702 History     First MD Initiated Contact with Patient 01/26/13 0710     Chief Complaint  Patient presents with  . Abdominal Pain   (Consider location/radiation/quality/duration/timing/severity/associated sxs/prior Treatment) HPI Comments: Jon Pena is a 62 y.o. Male who presents for evaluation of abdominal pain. The pain started last evening and is persistent. It is a dull sensation in his right lower quadrant. Yesterday, he felt constipated, he could not have a bowel movement. Later in the evening. He was able to have a bowel movement. He ate well, yesterday. During the night he began vomiting. He has not had any fever or chills. He denies dysuria or urinary frequency. He complains of generalized feeling. He has not had any other recent illnesses, or sick contacts . He has never had this happen before. There are no other known modifying factors.   Patient is a 63 y.o. male presenting with abdominal pain. The history is provided by the patient.  Abdominal Pain   History reviewed. No pertinent past medical history. History reviewed. No pertinent past surgical history. No family history on file. History  Substance Use Topics  . Smoking status: Current Every Day Smoker  . Smokeless tobacco: Not on file  . Alcohol Use: Yes    Review of Systems  Gastrointestinal: Positive for abdominal pain.  All other systems reviewed and are negative.    Allergies  Review of patient's allergies indicates no known allergies.  Home Medications   Current Outpatient Rx  Name  Route  Sig  Dispense  Refill  . Ibuprofen (IBU PO)   Oral   Take 1 tablet by mouth once.         . Naproxen Sodium (ALEVE PO)   Oral   Take 1 capsule by mouth once.          BP 118/71  Pulse 80  Temp(Src) 97.9 F (36.6 C) (Oral)  Resp 18  Ht 5\' 11"  (1.803 m)  Wt 212 lb (96.163 kg)  BMI 29.58 kg/m2  SpO2 96% Physical Exam  Nursing note and vitals  reviewed. Constitutional: He is oriented to person, place, and time. He appears well-developed and well-nourished.  HENT:  Head: Normocephalic and atraumatic.  Right Ear: External ear normal.  Left Ear: External ear normal.  Eyes: Conjunctivae and EOM are normal. Pupils are equal, round, and reactive to light.  Neck: Normal range of motion and phonation normal. Neck supple.  Cardiovascular: Normal rate, regular rhythm, normal heart sounds and intact distal pulses.   Pulmonary/Chest: Effort normal and breath sounds normal. He exhibits no bony tenderness.  Abdominal: Soft. Normal appearance. He exhibits distension (mild). He exhibits no mass. There is tenderness (Right lower quadrant tenderness, mild). There is no rebound and no guarding.  Decreased bowel sounds. Reducible and nontender, umbilical hernia  Musculoskeletal: Normal range of motion.  Neurological: He is alert and oriented to person, place, and time. He has normal strength. No cranial nerve deficit or sensory deficit. He exhibits normal muscle tone. Coordination normal.  Skin: Skin is warm, dry and intact.  Psychiatric: He has a normal mood and affect. His behavior is normal. Judgment and thought content normal.    ED Course   Procedures (including critical care time)  Medications  0.9 %  sodium chloride infusion (not administered)  iohexol (OMNIPAQUE) 300 MG/ML solution 25 mL (25 mLs Oral Contrast Given 01/26/13 0835)  sodium chloride 0.9 % bolus 1,000  mL (1,000 mLs Intravenous New Bag/Given 01/26/13 0743)  ondansetron (ZOFRAN) injection 4 mg (4 mg Intravenous Given 01/26/13 0744)  HYDROmorphone (DILAUDID) injection 1 mg (1 mg Intravenous Given 01/26/13 0748)  iohexol (OMNIPAQUE) 300 MG/ML solution 125 mL (125 mLs Intravenous Contrast Given 01/26/13 0845)  HYDROmorphone (DILAUDID) injection 1 mg (1 mg Intravenous Given 01/26/13 0917)    Patient Vitals for the past 24 hrs:  BP Temp Temp src Pulse Resp SpO2 Height Weight   01/26/13 0742 118/71 mmHg - - 80 - 96 % - -  01/26/13 0710 126/98 mmHg 97.9 F (36.6 C) Oral 87 18 97 % 5\' 11"  (1.803 m) 212 lb (96.163 kg)   9604- consult- I discussed the CT results with Dr. Constance Goltz, radiology; the patient has acute appendicitis, uncomplicated. 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  9:07 AM-Consult complete with Dr. Janee Morn. Patient case explained and discussed. He agrees to admit patient for further evaluation and treatment. Call ended at 0915  Findings discussed with patient and his mother. They were informed about the pancreatic mass. 0920   Labs Reviewed  CBC WITH DIFFERENTIAL - Abnormal; Notable for the following:    WBC 14.6 (*)    Neutrophils Relative % 85 (*)    Neutro Abs 12.4 (*)    Lymphocytes Relative 8 (*)    All other components within normal limits  COMPREHENSIVE METABOLIC PANEL - Abnormal; Notable for the following:    Glucose, Bld 130 (*)    All other components within normal limits  LIPASE, BLOOD  URINALYSIS, ROUTINE W REFLEX MICROSCOPIC   Ct Abdomen Pelvis W Contrast  01/26/2013   *RADIOLOGY REPORT*  Clinical Data: Sudden onset of abdominal pain last night.  Elevated white count.  CT ABDOMEN AND PELVIS WITH CONTRAST  Technique:  Multidetector CT imaging of the abdomen and pelvis was performed following the standard protocol during bolus administration of intravenous contrast.  Contrast: 25mL OMNIPAQUE IOHEXOL 300 MG/ML  SOLN, OMNIPAQUE IOHEXOL 300 MG/ML  SOLN  Comparison: None.  Findings: Inflamed enlarged appendix measuring up to 10.8 cm consistent with appendicitis without adjacent abscess.  The appendix is located anterior to the cecum.  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  Malignancy is a distinct possibility (less likely consideration is a result of pancreatitis).  No surrounding adenopathy.  No splenic vein thrombosis.  Mild fatty infiltration of the liver without focal worrisome mass.  Left  renal 2.8 cm cyst.  Right renal sub centimeter low density structure may be cyst although too small to characterize.  No splenic or adrenal lesion.  Tiny gallstones.  Atherosclerotic type changes mid to lower abdominal aorta without aneurysmal dilation.  Atherosclerotic type changes iliac arteries without high-grade stenosis or aneurysm.  No bony destructive lesion.  Degenerative changes lower lumbar spine.  Basilar subsegmental atelectatic changes.  Cardiomegaly.  Noncontrast filled views urinary bladder unremarkable.  Partial calcification of prostate gland without to enlargement.  IMPRESSION: Inflamed enlarged appendix measuring up to 10.8 cm consistent with appendicitis without adjacent abscess.  The appendix is located anterior to the cecum.  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  Malignancy is a distinct possibility (less likely consideration is a result of focal pancreatitis).  No surrounding adenopathy.  No splenic vein thrombosis.  Mild fatty infiltration of the liver without focal worrisome mass.  Left renal 2.8 cm cyst.  Right renal sub centimeter low density structure may be a cyst although too  small to characterize.  Tiny gallstones.  Atherosclerotic type changes mid to lower abdominal aorta without aneurysmal dilation.  Atherosclerotic type changes iliac arteries without high-grade stenosis or aneurysm.  No bony destructive lesion.  Degenerative changes lower lumbar spine.  Cardiomegaly.  Critical Value/emergent results were called by telephone at the time of interpretation on 01/26/2013 at 9:04 a.m. to Dr. Effie Shy, who verbally acknowledged these results.   Original Report Authenticated By: Lacy Duverney, M.D.   No diagnosis found.  MDM  Abdominal pain, secondary to appendicitis. The patient appears to be a good surgical candidate. There no apparent complications with the appendicitis. Incidental finding of pancreatic mass. This will need further evaluation and treatment.  Nursing Notes  Reviewed/ Care Coordinated, and agree without changes. Applicable Imaging Reviewed.  Interpretation of Laboratory Data incorporated into ED treatment  Plan: Admit  Flint Melter, MD 01/26/13 1526

## 2013-01-27 ENCOUNTER — Telehealth (INDEPENDENT_AMBULATORY_CARE_PROVIDER_SITE_OTHER): Payer: Self-pay

## 2013-01-27 ENCOUNTER — Encounter (HOSPITAL_COMMUNITY): Payer: Self-pay | Admitting: General Surgery

## 2013-01-27 MED ORDER — POLYETHYLENE GLYCOL 3350 17 G PO PACK
17.0000 g | PACK | Freq: Every day | ORAL | Status: DC
  Administered 2013-01-27: 17 g via ORAL
  Filled 2013-01-27: qty 1

## 2013-01-27 MED ORDER — INFLUENZA VIRUS VACC SPLIT PF IM SUSP: 0.5000 mL | Freq: Once | INTRAMUSCULAR | Status: DC

## 2013-01-27 MED ORDER — BISACODYL 10 MG RE SUPP
10.0000 mg | Freq: Once | RECTAL | Status: AC
  Administered 2013-01-27: 10 mg via RECTAL
  Filled 2013-01-27: qty 1

## 2013-01-27 MED ORDER — MENINGOCOCCAL VAC A,C,Y,W-135 ~~LOC~~ INJ
0.5000 mL | INJECTION | Freq: Once | SUBCUTANEOUS | Status: AC
  Administered 2013-01-27: 0.5 mL via SUBCUTANEOUS
  Filled 2013-01-27: qty 0.5

## 2013-01-27 MED ORDER — HAEMOPHILUS B POLYSAC CONJ VAC IM SOLR
0.5000 mL | Freq: Once | INTRAMUSCULAR | Status: AC
  Administered 2013-01-27: 0.5 mL via INTRAMUSCULAR
  Filled 2013-01-27: qty 0.5

## 2013-01-27 MED ORDER — OXYCODONE-ACETAMINOPHEN 5-325 MG PO TABS: 1.0000 | ORAL_TABLET | ORAL | Status: DC | PRN

## 2013-01-27 MED ORDER — HAEMOPHILUS B POLYSAC CONJ VAC IM SOLN
0.5000 mL | Freq: Once | INTRAMUSCULAR | Status: DC
  Filled 2013-01-27 (×2): qty 0.5

## 2013-01-27 NOTE — Telephone Encounter (Signed)
Pt has appt with Dr. Donell Beers 02/11/13 at 9:45a.m.

## 2013-01-27 NOTE — Progress Notes (Signed)
Lanette Hampshire to be D/C'd Home per MD order.  Discussed with the patient and all questions fully answered.    Medication List         ALEVE PO  Take 1 capsule by mouth once.     IBU PO  Take 1 tablet by mouth once.     oxyCODONE-acetaminophen 5-325 MG per tablet  Commonly known as:  PERCOCET/ROXICET  Take 1-2 tablets by mouth every 4 (four) hours as needed.        VVS, Skin clean, dry and intact without evidence of skin break down, no evidence of skin tears noted. IV catheter discontinued intact. Site without signs and symptoms of complications. Dressing and pressure applied.  An After Visit Summary was printed and given to the patient. Follow up appointments , new prescriptions and medication administration times given. Handouts on smoking cessation given and discussed Patient escorted via WC, and D/C home via private auto.  Cindra Eves, RN 01/27/2013 5:26 PM

## 2013-01-27 NOTE — Care Management Note (Signed)
    Page 1 of 1   01/27/2013     5:09:23 PM   CARE MANAGEMENT NOTE 01/27/2013  Patient:  Jon Pena, Jon Pena   Account Number:  [id number]  Date Initiated:  01/27/2013  Documentation initiated by:  Letha Cape  Subjective/Objective Assessment:   dx appendicitis, s/p lap appendectomy  admit as observation- lives with mother.     Action/Plan:   Anticipated DC Date:  01/27/2013   Anticipated DC Plan:  HOME/SELF CARE      DC Planning Services  CM consult      Choice offered to / List presented to:             Status of service:  Completed, signed off Medicare Important Message given?   (If response is "NO", the following Medicare IM given date fields will be blank) Date Medicare IM given:   Date Additional Medicare IM given:    Discharge Disposition:  HOME/SELF CARE  Per UR Regulation:  Reviewed for med. necessity/level of care/duration of stay  If discussed at Long Length of Stay Meetings, dates discussed:    Comments:  01/27/13 17:08 Letha Cape RN, BSN [phone] patient lives with mother, patient for dc  today, no needs anticipated.

## 2013-01-27 NOTE — Discharge Summary (Signed)
Patient ID: Jon Pena MRN: [id number] DOB/AGE: 1951-04-14 62 y.o.  Admit date: 01/26/2013 Discharge date: 01/27/2013  Procedures: lap appy and primary umbilical hernia repair  Consults: None  Reason for Admission: Patient developed right-sided abdominal pain yesterday at 10 PM. It gives him difficulty with sleeping. The pain is more localized to the right lower quadrant. It persisted this morning so he came to the emergency department for evaluation. He was found to have leukocytosis. CT scan of the abdomen and pelvis demonstrates acute appendicitis. It also shows an abnormality in the tail of his pancreas concerning for a mass versus focal pancreatitis. He does not have a history of pancreatitis. His father, however, passed away from pancreatitis in his 48s.   Admission Diagnoses:  1. Acute appendicitis 2. Umbilical hernia 3. Pancreatic tail mass  Hospital Course: the patient was admitted and taken to the OR where he underwent a lap appy with primary umbilical hernia repair.  The patient tolerated the procedure well.  He was tolerating a regular diet and pain was well controlled on POD#1.  His incisions were all c/d/i and he was surgically stable for dc home.    He did have a CEA and CA 19-9 drawn.  CEA was 5.6 and CA 19-9 was 4.8, essentially both were normal.    The patient was otherwise stable for dc home on POD#1.  PE: Abd: soft, appropriately tender, umbilical incision has a small seroma from prior hernia sac that is tender.  Otherwise all other incisions are c/d/i. +BS, minimal distention  Discharge Diagnoses:  1. Acute appendicitis, s/p lap appy 2. [facility], s/p primary repair 3. Pancreatic tail mass  Discharge Medications:   Medication List         ALEVE PO  Take 1 capsule by mouth once.     IBU PO  Take 1 tablet by mouth once.     oxyCODONE-acetaminophen 5-325 MG per tablet  Commonly known as:  PERCOCET/ROXICET  Take 1-2 tablets by mouth every 4 (four) hours as  needed.        Discharge Instructions:     Follow-up Information   Follow up with [facility], [place]. (our office will call you)    Contact information:   [address] [place] [address] [phone]       Signed: Letha Cape 01/27/2013, 12:13 PM

## 2013-02-03 ENCOUNTER — Telehealth (INDEPENDENT_AMBULATORY_CARE_PROVIDER_SITE_OTHER): Payer: Self-pay

## 2013-02-03 NOTE — Telephone Encounter (Signed)
Pt called 8 days post lap appy. Pt states he had hard BM 4 days po due to constipation. He states constipation has resolved but he has had small amount of rectal soreness and blood when he wipes. Pt offered appt today to have area examined. Pt states he will use rectal creams and wet wipes. Pt states if still having symptoms tomorrow pt will call back for appt.

## 2013-02-11 ENCOUNTER — Encounter: Payer: Self-pay | Admitting: Gastroenterology

## 2013-02-11 ENCOUNTER — Encounter (INDEPENDENT_AMBULATORY_CARE_PROVIDER_SITE_OTHER): Payer: Self-pay | Admitting: General Surgery

## 2013-02-11 ENCOUNTER — Telehealth: Payer: Self-pay

## 2013-02-11 ENCOUNTER — Ambulatory Visit (INDEPENDENT_AMBULATORY_CARE_PROVIDER_SITE_OTHER): Payer: BC Managed Care – PPO | Admitting: General Surgery

## 2013-02-11 ENCOUNTER — Other Ambulatory Visit: Payer: Self-pay

## 2013-02-11 VITALS — BP 130/82 | HR 60 | Resp 14 | Ht 71.0 in | Wt 215.4 lb

## 2013-02-11 DIAGNOSIS — K8689 Other specified diseases of pancreas: Secondary | ICD-10-CM

## 2013-02-11 DIAGNOSIS — K869 Disease of pancreas, unspecified: Secondary | ICD-10-CM

## 2013-02-11 NOTE — Patient Instructions (Signed)
Get MRI  Get EUS.  Will set up surgery.  Pancreas Surgery  THE OPERATION: The goal of the operation is to remove masses in the tail of the pancreas.  The distal pancreas and spleen will be removed.  The reason so much needs to be removed is that the blood supply and lymphatic channels and nodes are closely interconnected.  The operation takes between 2-3 hours depending on the amount of scar tissue you have present from prior surgeries, or from the mass.    WHAT HAPPENS IN THE OPERATING ROOM: I will start the operation with a diagnostic laparoscopy.  This means we will make small incisions to see if there is visible cancer outside of the pancreas.  If there is visible spread of cancer, I will stop the operation because research has shown that survival for pancreatic cancer is worse if you undergo a big operation without being able to remove all the cancer.  If no other cancer is seen, I will proceed with the rest of the operation.  You will have an incision in the midline around 3-4 inches, and several other incisions in the left upper abdomen.  Whatever the operative findings, I will discuss the case with your family after we are done in the operating room.  I will talk to you in the next few days when you are more awake.  I will see you in the hospital every weekday that I am not out of town.  My partners help see patients on the weekends and if I am out of town.    WHAT HAPPENS AFTER SURGERY: After surgery, you will go first to the recovery room,then to an appropriate level of room.  You will have many tubes and lines in place which is standard for this operation.   You will have a tube in your nose for 1-2 days in order to suction out the stomach.  YOU WILL NOT BE ABLE TO EAT FOR SEVERAL DAYS AFTER SURGERY.  You will have a catheter in your bladder.  On your abdomen, you will have a surgical drains and possibly a pain pump with numbing medicine.  You will have compression stockings on your  legs to decrease the risk of blood clots.    We will address your pain in several ways.  We will use an IV pain pump called a "PCA," or Patient Controlled Analgesia.  This allows you to press a button and immediately receive a dose of pain medication without waiting for a nurse.  We also use IV Tylenol and sometimes IV Toradol which is similar to ibuprofen.  You may also have a pump with numbing medicine delivered directly to your incision.  I use doses and medications that work for the majority of people, but you may need an adjustment to the dose or type of medicine if your pain is not adequately controlled.  Your throat may be sore, in which case you may need a throat spray or lozenges.    We will ask you to get out of bed the day after surgery in order to maximize your chances of not having complications.  Your risk of pneumonia and blood clots is lower with walking and sitting in the chair.  We will also ask you to perform breathing exercises.  We will also ask to you walk in your room and in the halls for the above reasons, but also in order for you to keep up your strength.    EATING: We will  usually start you on clear liquids in around 1-2 days if your bowel function seems to have returned.  We advance your diet slowly to make sure you are tolerating each step.  All patients do not have a normal appetite when they go home Most patients also find that their taste buds do not seem the same right after surgery, and this can continue into the time of possible post operative chemotherapy and radiation.  Some patients develop diabetes and will need assistance from a primary care doctor for medication.    OTHER TREATMENT? I will present your case when the pathology is available at our GI Multidisciplinary conference which occurs every 1-2 weeks.  Medical and Radiation Oncologists are present, as well as the pathologist and radiologist in addition to myself.  If you have a larger tumor or if you have  positive lymph nodes, we may have the oncologist stop by to see you while you are in the hospital.  Most firm post op treatment plans occur, however, once you are an outpatient because we will need to see how you are recovering from surgery.  GOING HOME! Usually you are able to go home in 3-7 days, depending on whether or not complications happen and what is going on with your overall health status.   If you have more health problems or if you have limited help at home, the therapists and nurses may recommend a temporary rehab or nursing facility to help you get back on your feet before you go home.  These decisions would be made while you are in the hospital with the assistance of a social worker or case manager.    Please bring all insurance/disability forms to our office for the staff to fill out   POSSIBLE COMPLICATIONS This is a very extensive operation and includes complications listed below: Bleeding Infection and possible wound complications such as hernia Damage to adjacent structures Leak of surgical connections, the worst of which is the connection between the intestine and the pancreas (10%) Possible need for other procedures, such as abscess drains in radiology or endoscopy.   Possible prolonged hospital stay Possible development of diabetes or worsening of current diabetes. (5-20%) Possible diarrhea from lack of pancreatic enzymes.   Prolonged fatigue/weakness/appetite MOST PATIENTS' ENERGY LEVEL IS NOT BACK TO NORMAL FOR AT LEAST 2-3 MONTHS.  OLDER PATIENTS MAY FEEL WEAK FOR LONGER PERIODS OF TIME.   Possible early recurrence of cancer Possible complications of your medical problems such as heart disease or arrhythmias. Death (less than 1%)  All possible complications are not listed, just the most common.    FURTHER INFORMATION? Please ask questions if you find something that we did not discuss in the office and would like more information.  If you would like another  appointment if you have many questions or if your family members would like to come as well, please contact the office.    IF YOU ARE TAKING ASPIRIN, PLAVIX, COUMADIN, OR OTHER BLOOD THINNERS, LET us KNOW IMMEDIATELY SO WE CAN CONTACT YOUR PRESCRIBING HEALTH CARE PROVIDER TO HOLD THE MEDICATION FOR 5-7 DAYS BEFORE SURGERY

## 2013-02-11 NOTE — Progress Notes (Signed)
Faera, we'll get this EUS set up.  Patty, He needs upper EUS, radial +/- linear, thurs sept 4th, ++MAC sedation, for pancreatic tail mass

## 2013-02-11 NOTE — Telephone Encounter (Signed)
EUS scheduled, pt instructed and medications reviewed.  Patient instructions mailed to home.  Patient to call with any questions or concerns.  

## 2013-02-11 NOTE — Progress Notes (Addendum)
Chief Complaint  Patient presents with  . New Evaluation    Pancreatic mass    HISTORY: Patient is a 62-year-old male who was discovered incidentally to have a distal pancreatic mass. He is referred by Dr. William McGough.  He recently had appendicitis and his CT scan demonstrated a 4.5 cm mass in the tail of the pancreas. This did appear to be solid and not cystic. He did not have any evidence of pancreatitis on the scan. There was no evidence of any other masses in the liver or lymph node involvement. He denies any weight loss. He has not had any fevers. He did have an aunt with breast cancer and a father with pancreatitis.  He drinks occasionally approximately one alcoholic beverage every 2 weeks. He doesn't smoke. He has tried recently quit. He plans on quitting. He is not have any chronic medical problems. He does play golf frequently and reasonable physical condition.  History reviewed. No pertinent past medical history.  Past Surgical History  Procedure Laterality Date  . Laparoscopic appendectomy N/A 01/26/2013    Procedure: APPENDECTOMY LAPAROSCOPIC;  Surgeon: Burke E Thompson, MD;  Location: [facility] OR;  Service: General;  Laterality: N/A;  . Appendectomy      Current Outpatient Prescriptions  Medication Sig Dispense Refill  . Naproxen Sodium (ALEVE PO) Take 1 capsule by mouth once.       No current facility-administered medications for this visit.     No Known Allergies   Family History  Problem Relation Age of Onset  . Cancer Paternal Aunt     breast     History   Social History  . Marital Status: Divorced    Spouse Name: N/A    Number of Children: N/A  . Years of Education: N/A   Social History Main Topics  . Smoking status: Former Smoker    Types: Cigarettes    Quit date: 02/11/2013  . Smokeless tobacco: Never Used  . Alcohol Use: Yes  . Drug Use: No  . Sexual Activity: Yes    REVIEW OF SYSTEMS - PERTINENT POSITIVES ONLY: 12 point review of systems  negative other than HPI and PMH  EXAM: Filed Vitals:   02/11/13 0940  BP: 130/82  Pulse: 60  Resp: 14   Filed Weights   02/11/13 0940  Weight: 215 lb 6.4 oz (97.705 kg)     Gen:  No acute distress.  Well nourished and well groomed.   Neurological: Alert and oriented to person, place, and time. Coordination normal.  Head: Normocephalic and atraumatic.  Eyes: Conjunctivae are normal. Pupils are equal, round, and reactive to light. No scleral icterus.  Neck: Normal range of motion. Neck supple. No tracheal deviation or thyromegaly present.  Cardiovascular: Normal rate, regular rhythm, normal heart sounds and intact distal pulses.  Exam reveals no gallop and no friction rub.  No murmur heard. Respiratory: Effort normal.  No respiratory distress. No chest wall tenderness. Breath sounds normal.  No wheezes, rales or rhonchi.  GI: Soft. Bowel sounds are normal. The abdomen is soft and nontender.  There is no rebound and no guarding. Appendectomy scars are healing, no evidence of infection Musculoskeletal: Normal range of motion. Extremities are nontender.  Lymphadenopathy: No cervical, preauricular, postauricular or axillary adenopathy is present Skin: Skin is warm and dry. No rash noted. No diaphoresis. No erythema. No pallor. No clubbing, cyanosis, or edema.   Psychiatric: Normal mood and affect. Behavior is normal. Judgment and thought content normal.    LABORATORY   RESULTS: Available labs are reviewed  CA 19-9 is 4.8  RADIOLOGY RESULTS: See E-Chart or I-Site for most recent results.  Images and reports are reviewed. CT abd/pel IMPRESSION:  Inflamed enlarged appendix measuring up to 10.8 cm consistent with  appendicitis without adjacent abscess. The appendix is located  anterior to the cecum.  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 Malignancy is a distinct  possibility (less likely consideration is a result of focal  pancreatitis). No surrounding adenopathy. No splenic vein   thrombosis.  Mild fatty infiltration of the liver without focal worrisome mass.  Left renal 2.8 cm cyst. Right renal sub centimeter low density  structure may be a cyst although too small to characterize.  Tiny gallstones.  Atherosclerotic type changes mid to lower abdominal aorta without  aneurysmal dilation. Atherosclerotic type changes iliac arteries  without high-grade stenosis or aneurysm.  No bony destructive lesion. Degenerative changes lower lumbar  spine.  Cardiomegaly.    ASSESSMENT AND PLAN: Pancreatic mass Plan MRI/MRCP  Plan EUS to evaluate if this is malignant.  Will also set up surgery for distal pancreatectomy/possible splenectomy.  He has already been vaccinated for post splenectomy vaccines against encapsulated organisms.    His CA 19-9 is low, this certainly could be neuroendocrine tumor, rather than pancreatic adenocarcinoma.   He will almost definitely need resection unless the mass was a sequelae of pancreatitis and has resolved.  I discussed risks of surgery including bleeding, infection, damage to adjacent structures, pancreatic leak, diabetes, pancreatic exocrine insufficiency.    40 min spent in consultation, >50% in counseling.       Nomie Buchberger L Carver Murakami MD Surgical Oncology, General and Endocrine Surgery [facility]      Visit Diagnoses: 1. Pancreatic mass     Primary Care Physician: MCGOUGH,WILLIAM M, MD    

## 2013-02-11 NOTE — Telephone Encounter (Signed)
Progress Notes Info    Author Note Status Last Update User Last Update Date/Time   Rachael Fee, MD Signed Rachael Fee, MD 02/11/2013 1:03 PM      Progress Notes    Faera, we'll get this EUS set up.  Pat Sires,  He needs upper EUS, radial +/- linear, thurs sept 4th, ++MAC sedation, for pancreatic tail mass      Pt has been scheduled for 02/20/13 115 pm

## 2013-02-11 NOTE — Assessment & Plan Note (Signed)
Plan MRI/MRCP  Plan EUS to evaluate if this is malignant.  Will also set up surgery for distal pancreatectomy/possible splenectomy.  He has already been vaccinated for post splenectomy vaccines against encapsulated organisms.    His CA 19-9 is low, this certainly could be neuroendocrine tumor, rather than pancreatic adenocarcinoma.   He will almost definitely need resection unless the mass was a sequelae of pancreatitis and has resolved.  I discussed risks of surgery including bleeding, infection, damage to adjacent structures, pancreatic leak, diabetes, pancreatic exocrine insufficiency.    40 min spent in consultation, >50% in counseling.

## 2013-02-12 ENCOUNTER — Encounter (HOSPITAL_COMMUNITY): Payer: Self-pay | Admitting: *Deleted

## 2013-02-12 ENCOUNTER — Encounter (HOSPITAL_COMMUNITY): Payer: Self-pay | Admitting: Pharmacy Technician

## 2013-02-13 ENCOUNTER — Telehealth: Payer: Self-pay | Admitting: Gastroenterology

## 2013-02-13 NOTE — Telephone Encounter (Signed)
Jon Pena is aware that the pt has been scheduled for EUS

## 2013-02-16 ENCOUNTER — Other Ambulatory Visit: Payer: BC Managed Care – PPO

## 2013-02-19 ENCOUNTER — Ambulatory Visit
Admission: RE | Admit: 2013-02-19 | Discharge: 2013-02-19 | Disposition: A | Discharge status: Home / Self Care | Payer: BC Managed Care – PPO | Source: Ambulatory Visit | Attending: General Surgery | Admitting: General Surgery

## 2013-02-19 ENCOUNTER — Encounter (HOSPITAL_COMMUNITY): Payer: Self-pay | Admitting: Pharmacy Technician

## 2013-02-19 DIAGNOSIS — K8689 Other specified diseases of pancreas: Secondary | ICD-10-CM

## 2013-02-19 MED ORDER — GADOBENATE DIMEGLUMINE 529 MG/ML IV SOLN
20.0000 mL | Freq: Once | INTRAVENOUS | Status: AC | PRN
  Administered 2013-02-19: 20 mL via INTRAVENOUS

## 2013-02-20 ENCOUNTER — Ambulatory Visit (HOSPITAL_COMMUNITY): Payer: BC Managed Care – PPO | Admitting: Anesthesiology

## 2013-02-20 ENCOUNTER — Encounter (HOSPITAL_COMMUNITY): Payer: Self-pay | Admitting: Gastroenterology

## 2013-02-20 ENCOUNTER — Encounter (HOSPITAL_COMMUNITY)
Admission: RE | Disposition: A | Discharge status: Home / Self Care | Payer: BC Managed Care – PPO | Source: Ambulatory Visit | Attending: Gastroenterology

## 2013-02-20 ENCOUNTER — Ambulatory Visit (HOSPITAL_COMMUNITY)
Admission: RE | Admit: 2013-02-20 | Discharge: 2013-02-20 | Disposition: A | Discharge status: Home / Self Care | Payer: BC Managed Care – PPO | Source: Ambulatory Visit | Attending: Gastroenterology | Admitting: Gastroenterology

## 2013-02-20 ENCOUNTER — Telehealth (INDEPENDENT_AMBULATORY_CARE_PROVIDER_SITE_OTHER): Payer: Self-pay

## 2013-02-20 ENCOUNTER — Encounter (HOSPITAL_COMMUNITY): Payer: Self-pay | Admitting: Anesthesiology

## 2013-02-20 DIAGNOSIS — K219 Gastro-esophageal reflux disease without esophagitis: Secondary | ICD-10-CM | POA: Insufficient documentation

## 2013-02-20 DIAGNOSIS — D490 Neoplasm of unspecified behavior of digestive system: Secondary | ICD-10-CM | POA: Insufficient documentation

## 2013-02-20 DIAGNOSIS — K869 Disease of pancreas, unspecified: Secondary | ICD-10-CM | POA: Insufficient documentation

## 2013-02-20 DIAGNOSIS — K8689 Other specified diseases of pancreas: Secondary | ICD-10-CM

## 2013-02-20 DIAGNOSIS — Z803 Family history of malignant neoplasm of breast: Secondary | ICD-10-CM | POA: Insufficient documentation

## 2013-02-20 HISTORY — DX: Gastro-esophageal reflux disease without esophagitis: K21.9

## 2013-02-20 HISTORY — PX: EUS: SHX5427

## 2013-02-20 SURGERY — UPPER ENDOSCOPIC ULTRASOUND (EUS) LINEAR
Anesthesia: Monitor Anesthesia Care

## 2013-02-20 MED ORDER — LIDOCAINE HCL 1 % IJ SOLN
INTRAMUSCULAR | Status: DC | PRN
  Administered 2013-02-20: 50 mg via INTRADERMAL

## 2013-02-20 MED ORDER — MIDAZOLAM HCL 5 MG/5ML IJ SOLN
INTRAMUSCULAR | Status: DC | PRN
  Administered 2013-02-20: 2 mg via INTRAVENOUS

## 2013-02-20 MED ORDER — PROPOFOL INFUSION 10 MG/ML OPTIME
INTRAVENOUS | Status: DC | PRN
  Administered 2013-02-20: 180 ug/kg/min via INTRAVENOUS

## 2013-02-20 MED ORDER — FENTANYL CITRATE 0.05 MG/ML IJ SOLN
INTRAMUSCULAR | Status: DC | PRN
  Administered 2013-02-20 (×2): 50 ug via INTRAVENOUS

## 2013-02-20 MED ORDER — KETAMINE HCL 10 MG/ML IJ SOLN
INTRAMUSCULAR | Status: DC | PRN
  Administered 2013-02-20: 10 mg via INTRAVENOUS

## 2013-02-20 MED ORDER — LACTATED RINGERS IV SOLN
INTRAVENOUS | Status: DC
  Administered 2013-02-20: 13:00:00 via INTRAVENOUS

## 2013-02-20 NOTE — Anesthesia Postprocedure Evaluation (Signed)
Anesthesia Post Note  Patient: Jon Pena  Procedure(s) Performed: Procedure(s) (LRB): UPPER ENDOSCOPIC ULTRASOUND (EUS) LINEAR (N/A)  Anesthesia type: MAC  Patient location: PACU  Post pain: Pain level controlled  Post assessment: Post-op Vital signs reviewed  Last Vitals: BP 132/90  Pulse 57  Temp(Src) 36.6 C (Oral)  Resp 13  Ht 5\' 11"  (1.803 m)  Wt 202 lb 12 oz (91.967 kg)  BMI 28.29 kg/m2  SpO2 98%  Post vital signs: Reviewed  Level of consciousness: awake  Complications: No apparent anesthesia complications

## 2013-02-20 NOTE — Transfer of Care (Signed)
Immediate Anesthesia Transfer of Care Note  Patient: Jon Pena  Procedure(s) Performed: Procedure(s): UPPER ENDOSCOPIC ULTRASOUND (EUS) LINEAR (N/A)  Patient Location: PACU and Endoscopy Unit  Anesthesia Type:MAC  Level of Consciousness: awake, alert , oriented and patient cooperative  Airway & Oxygen Therapy: Patient Spontanous Breathing and Patient connected to nasal cannula oxygen  Post-op Assessment: Report given to PACU RN, Post -op Vital signs reviewed and stable and Patient moving all extremities  Post vital signs: Reviewed and stable  Complications: No apparent anesthesia complications

## 2013-02-20 NOTE — Op Note (Signed)
[facility] [address] [place], [address]   ENDOSCOPIC ULTRASOUND PROCEDURE REPORT  PATIENT: Jon, Pena  MR#: [id number] BIRTHDATE: 08-Dec-1950  GENDER: Male ENDOSCOPIST: Rachael Fee, MD REFERRED BY:  Almond Lint, M.D. PROCEDURE DATE:  02/20/2013 PROCEDURE:   Upper EUS w/FNA ASA CLASS:      Class II INDICATIONS:   pancreatic tail mass noted on CT scan when he was admitted 01/2013 with acute appendicitis; CA 19-9 is normal, no weight loss, no pancreatic disease personally, his father died of "pancreatitis". MEDICATIONS: MAC sedation, administered by CRNA  DESCRIPTION OF PROCEDURE:   After the risks benefits and alternatives of the procedure were  explained, informed consent was obtained. The patient was then placed in the left, lateral, decubitus postion and IV sedation was administered. Throughout the procedure, the patients blood pressure, pulse and oxygen saturations were monitored continuously.  Under direct visualization, the Pentax Radial EUS [id number]  endoscope was introduced through the mouth  and advanced to the second portion of the duodenum .  Water was used as necessary to provide an acoustic interface.  Upon completion of the imaging, water was removed and the patient was sent to the recovery room in satisfactory condition.   Endoscopic findings: 1. Normal UGI tract  EUS findings: 1. There was a 3.9cm by 3.8cm hypoechoic, heterogeneous mass in tail of pancreas with irregular outer margins. The mass does not involve SMV, PV, SMA or celiac vessels and it was sampled with 2 passes of a 19 gauge FNA needle and 3 passes with a 25 gauge FNA needle. 2. Remaining pancreatic parenchyma was normal. 3. Main pancreatic duct was normal. 4. CBD was normal, non-dilated. 5. No peripancreatic adenopathy. 6. Limited views of liver, spleen, portal and splenic vessels were all normal.  Impression: 3.9cm by 3.8cm mass in tail of pancreas.   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  Await final pathology report.   _______________________________ eSigned:  Rachael Fee, MD 02/20/2013 2:55 PM

## 2013-02-20 NOTE — Interval H&P Note (Signed)
History and Physical Interval Note:  02/20/2013 12:21 PM  Jon Pena  has presented today for surgery, with the diagnosis of Pancreatic mass [577.9]  The various methods of treatment have been discussed with the patient and family. After consideration of risks, benefits and other options for treatment, the patient has consented to  Procedure(s): UPPER ENDOSCOPIC ULTRASOUND (EUS) LINEAR (N/A) as a surgical intervention .  The patient's history has been reviewed, patient examined, no change in status, stable for surgery.  I have reviewed the patient's chart and labs.  Questions were answered to the patient's satisfaction.     Rob Bunting

## 2013-02-20 NOTE — Anesthesia Preprocedure Evaluation (Addendum)
Anesthesia Evaluation    Airway Mallampati: II TM Distance: >3 FB Neck ROM: Full    Dental  (+) Teeth Intact and Dental Advisory Given   Pulmonary Current Smoker,  breath sounds clear to auscultation        Cardiovascular negative cardio ROS  Rhythm:Regular Rate:Normal     Neuro/Psych negative neurological ROS     GI/Hepatic Neg liver ROS, GERD-  Medicated,Patient did not received Oral Contrast Agents (0930),  Endo/Other  negative endocrine ROS  Renal/GU negative Renal ROS     Musculoskeletal negative musculoskeletal ROS (+)   Abdominal (+) - obese,   Peds  Hematology negative hematology ROS (+)   Anesthesia Other Findings   Reproductive/Obstetrics                          Anesthesia Physical  Anesthesia Plan  ASA: II and emergent  Anesthesia Plan: MAC   Post-op Pain Management:    Induction:   Airway Management Planned:   Additional Equipment:   Intra-op Plan:   Post-operative Plan:   Informed Consent: I have reviewed the patients History and Physical, chart, labs and discussed the procedure including the risks, benefits and alternatives for the proposed anesthesia with the patient or authorized representative who has indicated his/her understanding and acceptance.   Dental advisory given  Plan Discussed with: CRNA  Anesthesia Plan Comments:         Anesthesia Quick Evaluation

## 2013-02-20 NOTE — H&P (View-Only) (Signed)
Chief Complaint  Patient presents with  . New Evaluation    Pancreatic mass    HISTORY: Patient is a 62 year old male who was discovered incidentally to have a distal pancreatic mass. He is referred by Dr. Karleen Hampshire.  He recently had appendicitis and his CT scan demonstrated a 4.5 cm mass in the tail of the pancreas. This did appear to be solid and not cystic. He did not have any evidence of pancreatitis on the scan. There was no evidence of any other masses in the liver or lymph node involvement. He denies any weight loss. He has not had any fevers. He did have an aunt with breast cancer and a father with pancreatitis.  He drinks occasionally approximately one alcoholic beverage every 2 weeks. He doesn't smoke. He has tried recently quit. He plans on quitting. He is not have any chronic medical problems. He does play golf frequently and reasonable physical condition.  History reviewed. No pertinent past medical history.  Past Surgical History  Procedure Laterality Date  . Laparoscopic appendectomy N/A 01/26/2013    Procedure: APPENDECTOMY LAPAROSCOPIC;  Surgeon: Liz Malady, MD;  Location: [facility] OR;  Service: General;  Laterality: N/A;  . Appendectomy      Current Outpatient Prescriptions  Medication Sig Dispense Refill  . Naproxen Sodium (ALEVE PO) Take 1 capsule by mouth once.       No current facility-administered medications for this visit.     No Known Allergies   Family History  Problem Relation Age of Onset  . Cancer Paternal Aunt     breast     History   Social History  . Marital Status: Divorced    Spouse Name: N/A    Number of Children: N/A  . Years of Education: N/A   Social History Main Topics  . Smoking status: Former Smoker    Types: Cigarettes    Quit date: 02/11/2013  . Smokeless tobacco: Never Used  . Alcohol Use: Yes  . Drug Use: No  . Sexual Activity: Yes    REVIEW OF SYSTEMS - PERTINENT POSITIVES ONLY: 12 point review of systems  negative other than HPI and PMH  EXAM: Filed Vitals:   02/11/13 0940  BP: 130/82  Pulse: 60  Resp: 14   Filed Weights   02/11/13 0940  Weight: 215 lb 6.4 oz (97.705 kg)     Gen:  No acute distress.  Well nourished and well groomed.   Neurological: Alert and oriented to person, place, and time. Coordination normal.  Head: Normocephalic and atraumatic.  Eyes: Conjunctivae are normal. Pupils are equal, round, and reactive to light. No scleral icterus.  Neck: Normal range of motion. Neck supple. No tracheal deviation or thyromegaly present.  Cardiovascular: Normal rate, regular rhythm, normal heart sounds and intact distal pulses.  Exam reveals no gallop and no friction rub.  No murmur heard. Respiratory: Effort normal.  No respiratory distress. No chest wall tenderness. Breath sounds normal.  No wheezes, rales or rhonchi.  GI: Soft. Bowel sounds are normal. The abdomen is soft and nontender.  There is no rebound and no guarding. Appendectomy scars are healing, no evidence of infection Musculoskeletal: Normal range of motion. Extremities are nontender.  Lymphadenopathy: No cervical, preauricular, postauricular or axillary adenopathy is present Skin: Skin is warm and dry. No rash noted. No diaphoresis. No erythema. No pallor. No clubbing, cyanosis, or edema.   Psychiatric: Normal mood and affect. Behavior is normal. Judgment and thought content normal.    LABORATORY  RESULTS: Available labs are reviewed  CA 19-9 is 4.8  RADIOLOGY RESULTS: See E-Chart or I-Site for most recent results.  Images and reports are reviewed. CT abd/pel IMPRESSION:  Inflamed enlarged appendix measuring up to 10.8 cm consistent with  appendicitis without adjacent abscess. The appendix is located  anterior to the cecum.  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 Malignancy is a distinct  possibility (less likely consideration is a result of focal  pancreatitis). No surrounding adenopathy. No splenic vein   thrombosis.  Mild fatty infiltration of the liver without focal worrisome mass.  Left renal 2.8 cm cyst. Right renal sub centimeter low density  structure may be a cyst although too small to characterize.  Tiny gallstones.  Atherosclerotic type changes mid to lower abdominal aorta without  aneurysmal dilation. Atherosclerotic type changes iliac arteries  without high-grade stenosis or aneurysm.  No bony destructive lesion. Degenerative changes lower lumbar  spine.  Cardiomegaly.    ASSESSMENT AND PLAN: Pancreatic mass Plan MRI/MRCP  Plan EUS to evaluate if this is malignant.  Will also set up surgery for distal pancreatectomy/possible splenectomy.  He has already been vaccinated for post splenectomy vaccines against encapsulated organisms.    His CA 19-9 is low, this certainly could be neuroendocrine tumor, rather than pancreatic adenocarcinoma.   He will almost definitely need resection unless the mass was a sequelae of pancreatitis and has resolved.  I discussed risks of surgery including bleeding, infection, damage to adjacent structures, pancreatic leak, diabetes, pancreatic exocrine insufficiency.    40 min spent in consultation, >50% in counseling.       Maudry Diego MD Surgical Oncology, General and Endocrine Surgery [facility]      Visit Diagnoses: 1. Pancreatic mass     Primary Care Physician: Kirk Ruths, MD

## 2013-02-20 NOTE — Telephone Encounter (Signed)
Erskine Squibb from [facility] called with a call report. Patients MRCP is ready in epic to view.

## 2013-02-20 NOTE — Interval H&P Note (Signed)
History and Physical Interval Note:  02/20/2013 12:20 PM  Jon Pena  has presented today for surgery, with the diagnosis of Pancreatic mass [577.9]  The various methods of treatment have been discussed with the patient and family. After consideration of risks, benefits and other options for treatment, the patient has consented to  Procedure(s): UPPER ENDOSCOPIC ULTRASOUND (EUS) LINEAR (N/A) as a surgical intervention .  The patient's history has been reviewed, patient examined, no change in status, stable for surgery.  I have reviewed the patient's chart and labs.  Questions were answered to the patient's satisfaction.     Rob Bunting

## 2013-02-21 ENCOUNTER — Encounter (HOSPITAL_COMMUNITY): Payer: Self-pay | Admitting: Gastroenterology

## 2013-02-24 ENCOUNTER — Telehealth: Payer: Self-pay | Admitting: Gastroenterology

## 2013-02-24 NOTE — Telephone Encounter (Signed)
Stage IIa from the information currently available

## 2013-02-24 NOTE — Telephone Encounter (Signed)
Dr Christella Hartigan the pt would like to know what stage his cancer is.  Please advise

## 2013-02-24 NOTE — Telephone Encounter (Signed)
The patient has been notified of this information and all questions answered.

## 2013-02-24 NOTE — Pre-Procedure Instructions (Signed)
HONORIO DEVOL  02/24/2013   Your procedure is scheduled on: Thursday, September 11th.  Report to [facility] at 10:00 AM.  Call this number if you have problems the morning of surgery: [phone]   Remember:   Do not eat food or drink liquids after midnight.   Take these medicines the morning of surgery with A SIP OF WATER: Famotidine (Pepcid).   Do not wear jewelry, make-up or nail polish.  Do not wear lotions, powders, or perfumes. You may wear deodorant.   Men may shave face and neck.  Do not bring valuables to the hospital.  [facility] is not responsible for any belongings or valuables.  Contacts, dentures or bridgework may not be worn into surgery.  Leave suitcase in the car. After surgery it may be brought to your room.  For patients admitted to the hospital, checkout time is 11:00 AM the day of discharge.     Special Instructions: Shower using CHG 2 nights before surgery and the night before surgery.  If you shower the day of surgery use CHG.  Use special wash - you have one bottle of CHG for all showers.  You should use approximately 1/3 of the bottle for each shower.   Please read over the following fact sheets that you were given: Pain Booklet, Coughing and Deep Breathing, Blood Transfusion Information and Surgical Site Infection Prevention

## 2013-02-25 ENCOUNTER — Encounter (HOSPITAL_COMMUNITY)
Admission: RE | Admit: 2013-02-25 | Discharge: 2013-02-25 | Disposition: A | Discharge status: Home / Self Care | Payer: BC Managed Care – PPO | Source: Ambulatory Visit | Attending: General Surgery | Admitting: General Surgery

## 2013-02-25 ENCOUNTER — Encounter (HOSPITAL_COMMUNITY): Payer: Self-pay

## 2013-02-25 VITALS — BP 130/82 | HR 78 | Temp 97.8°F | Resp 20 | Ht 71.0 in | Wt 214.3 lb

## 2013-02-25 DIAGNOSIS — K8689 Other specified diseases of pancreas: Secondary | ICD-10-CM

## 2013-02-25 DIAGNOSIS — Z01818 Encounter for other preprocedural examination: Secondary | ICD-10-CM | POA: Insufficient documentation

## 2013-02-25 DIAGNOSIS — Z01812 Encounter for preprocedural laboratory examination: Secondary | ICD-10-CM | POA: Insufficient documentation

## 2013-02-25 LAB — COMPREHENSIVE METABOLIC PANEL
AST: 17 U/L (ref 0–37)
Albumin: 3.9 g/dL (ref 3.5–5.2)
BUN: 20 mg/dL (ref 6–23)
CO2: 25 mEq/L (ref 19–32)
Calcium: 9.4 mg/dL (ref 8.4–10.5)
Creatinine, Ser: 0.85 mg/dL (ref 0.50–1.35)
GFR calc non Af Amer: >90 mL/min (ref >90)
Total Bilirubin: 0.1 mg/dL — ABNORMAL LOW (ref 0.3–1.2)

## 2013-02-25 LAB — URINALYSIS, ROUTINE W REFLEX MICROSCOPIC
Bilirubin Urine: NEGATIVE
Hgb urine dipstick: NEGATIVE
Ketones, ur: NEGATIVE mg/dL
Nitrite: NEGATIVE
Specific Gravity, Urine: 1.025 (ref 1.005–1.030)
pH: 5 (ref 5.0–8.0)

## 2013-02-25 LAB — CBC WITH DIFFERENTIAL/PLATELET
Basophils Absolute: 0 10*3/uL (ref 0.0–0.1)
Basophils Relative: 0 % (ref 0–1)
Eosinophils Relative: 6 % — ABNORMAL HIGH (ref 0–5)
HCT: 46.4 % (ref 39.0–52.0)
Hemoglobin: 15.9 g/dL (ref 13.0–17.0)
MCHC: 34.3 g/dL (ref 30.0–36.0)
MCV: 88.9 fL (ref 78.0–100.0)
Monocytes Absolute: 0.7 10*3/uL (ref 0.1–1.0)
Monocytes Relative: 9 % (ref 3–12)
RDW: 13.6 % (ref 11.5–15.5)

## 2013-02-25 LAB — TYPE AND SCREEN

## 2013-02-25 LAB — LIPASE, BLOOD: Lipase: 89 U/L — ABNORMAL HIGH (ref 11–59)

## 2013-02-25 LAB — PROTIME-INR
INR: 0.95 (ref 0.00–1.49)
Prothrombin Time: 12.5 seconds (ref 11.6–15.2)

## 2013-02-25 MED ORDER — SODIUM CHLORIDE 0.9 % IV SOLN: INTRAVENOUS | Status: DC

## 2013-02-26 MED ORDER — CEFOXITIN SODIUM 2 G IV SOLR
2.0000 g | INTRAVENOUS | Status: AC
  Administered 2013-02-27: 2 g via INTRAVENOUS
  Filled 2013-02-26: qty 2

## 2013-02-27 ENCOUNTER — Encounter (HOSPITAL_COMMUNITY)
Admission: RE | Disposition: A | Discharge status: Home / Self Care | Payer: Self-pay | Source: Ambulatory Visit | Attending: General Surgery

## 2013-02-27 ENCOUNTER — Encounter (HOSPITAL_COMMUNITY): Payer: Self-pay | Admitting: Anesthesiology

## 2013-02-27 ENCOUNTER — Inpatient Hospital Stay (HOSPITAL_COMMUNITY)
Admission: RE | Admit: 2013-02-27 | Discharge: 2013-03-05 | DRG: 191 | Disposition: A | Discharge status: Home / Self Care | Payer: BC Managed Care – PPO | Source: Ambulatory Visit | Attending: General Surgery | Admitting: General Surgery

## 2013-02-27 ENCOUNTER — Inpatient Hospital Stay (HOSPITAL_COMMUNITY): Payer: BC Managed Care – PPO | Admitting: Anesthesiology

## 2013-02-27 ENCOUNTER — Encounter (HOSPITAL_COMMUNITY): Payer: Self-pay | Admitting: *Deleted

## 2013-02-27 DIAGNOSIS — C252 Malignant neoplasm of tail of pancreas: Principal | ICD-10-CM | POA: Diagnosis present

## 2013-02-27 DIAGNOSIS — C801 Malignant (primary) neoplasm, unspecified: Secondary | ICD-10-CM | POA: Insufficient documentation

## 2013-02-27 DIAGNOSIS — C259 Malignant neoplasm of pancreas, unspecified: Secondary | ICD-10-CM

## 2013-02-27 DIAGNOSIS — K219 Gastro-esophageal reflux disease without esophagitis: Secondary | ICD-10-CM | POA: Diagnosis present

## 2013-02-27 DIAGNOSIS — K8689 Other specified diseases of pancreas: Secondary | ICD-10-CM

## 2013-02-27 DIAGNOSIS — J9819 Other pulmonary collapse: Secondary | ICD-10-CM | POA: Diagnosis not present

## 2013-02-27 DIAGNOSIS — E871 Hypo-osmolality and hyponatremia: Secondary | ICD-10-CM | POA: Diagnosis not present

## 2013-02-27 DIAGNOSIS — F17200 Nicotine dependence, unspecified, uncomplicated: Secondary | ICD-10-CM | POA: Diagnosis present

## 2013-02-27 DIAGNOSIS — Z01812 Encounter for preprocedural laboratory examination: Secondary | ICD-10-CM

## 2013-02-27 DIAGNOSIS — Z79899 Other long term (current) drug therapy: Secondary | ICD-10-CM

## 2013-02-27 DIAGNOSIS — R7309 Other abnormal glucose: Secondary | ICD-10-CM | POA: Diagnosis present

## 2013-02-27 DIAGNOSIS — D72829 Elevated white blood cell count, unspecified: Secondary | ICD-10-CM | POA: Diagnosis not present

## 2013-02-27 HISTORY — PX: LAPAROSCOPIC SPLENECTOMY: SHX409

## 2013-02-27 HISTORY — PX: PANCREATECTOMY: SHX5261

## 2013-02-27 HISTORY — DX: Malignant neoplasm of pancreas, unspecified: C25.9

## 2013-02-27 LAB — CBC
HCT: 43.5 % (ref 39.0–52.0)
MCHC: 34.7 g/dL (ref 30.0–36.0)
MCV: 89.5 fL (ref 78.0–100.0)
RDW: 13.7 % (ref 11.5–15.5)
WBC: 15.4 10*3/uL — ABNORMAL HIGH (ref 4.0–10.5)

## 2013-02-27 LAB — CREATININE, SERUM: GFR calc Af Amer: >90 mL/min (ref >90)

## 2013-02-27 SURGERY — PANCREATECTOMY, LAPAROSCOPIC
Anesthesia: General | Site: Abdomen | Wound class: Clean

## 2013-02-27 MED ORDER — DEXAMETHASONE SODIUM PHOSPHATE 4 MG/ML IJ SOLN
INTRAMUSCULAR | Status: DC | PRN
  Administered 2013-02-27: 10 mg via INTRAVENOUS

## 2013-02-27 MED ORDER — LACTATED RINGERS IV SOLN
INTRAVENOUS | Status: DC | PRN
  Administered 2013-02-27: 12:00:00 via INTRAVENOUS

## 2013-02-27 MED ORDER — KCL IN DEXTROSE-NACL 20-5-0.45 MEQ/L-%-% IV SOLN
INTRAVENOUS | Status: DC
  Administered 2013-02-27 – 2013-03-01 (×4): via INTRAVENOUS
  Filled 2013-02-27 (×6): qty 1000

## 2013-02-27 MED ORDER — WHITE PETROLATUM GEL
Status: AC
  Administered 2013-02-27: 0.2
  Filled 2013-02-27: qty 5

## 2013-02-27 MED ORDER — ACYCLOVIR 800 MG PO TABS: 800.0000 mg | ORAL_TABLET | Freq: Four times a day (QID) | ORAL | Status: DC | PRN

## 2013-02-27 MED ORDER — EPHEDRINE SULFATE 50 MG/ML IJ SOLN
INTRAMUSCULAR | Status: DC | PRN
  Administered 2013-02-27 (×2): 10 mg via INTRAVENOUS

## 2013-02-27 MED ORDER — SODIUM CHLORIDE 0.9 % IR SOLN
Status: DC | PRN
  Administered 2013-02-27: 1

## 2013-02-27 MED ORDER — PHENYLEPHRINE HCL 10 MG/ML IJ SOLN
INTRAMUSCULAR | Status: DC | PRN
  Administered 2013-02-27 (×2): 80 ug via INTRAVENOUS

## 2013-02-27 MED ORDER — LIDOCAINE HCL 1 % IJ SOLN
INTRAMUSCULAR | Status: DC | PRN
  Administered 2013-02-27: 14:00:00

## 2013-02-27 MED ORDER — MORPHINE SULFATE 2 MG/ML IJ SOLN: 1.0000 mg | INTRAMUSCULAR | Status: DC | PRN

## 2013-02-27 MED ORDER — POLYETHYL GLYCOL-PROPYL GLYCOL 0.4-0.3 % OP SOLN: 1.0000 [drp] | Freq: Every day | OPHTHALMIC | Status: DC | PRN

## 2013-02-27 MED ORDER — PROPOFOL 10 MG/ML IV BOLUS
INTRAVENOUS | Status: DC | PRN
  Administered 2013-02-27: 200 mg via INTRAVENOUS

## 2013-02-27 MED ORDER — ALBUMIN HUMAN 5 % IV SOLN
INTRAVENOUS | Status: DC | PRN
  Administered 2013-02-27: 14:00:00 via INTRAVENOUS

## 2013-02-27 MED ORDER — DIPHENHYDRAMINE HCL 50 MG/ML IJ SOLN: 12.5000 mg | Freq: Four times a day (QID) | INTRAMUSCULAR | Status: DC | PRN

## 2013-02-27 MED ORDER — KETOROLAC TROMETHAMINE 15 MG/ML IJ SOLN
15.0000 mg | Freq: Four times a day (QID) | INTRAMUSCULAR | Status: AC
Start: 2013-02-27 — End: 2013-03-01
  Administered 2013-02-27 – 2013-03-01 (×8): 15 mg via INTRAVENOUS
  Filled 2013-02-27 (×11): qty 1

## 2013-02-27 MED ORDER — HEPARIN SODIUM (PORCINE) 5000 UNIT/ML IJ SOLN
5000.0000 [IU] | Freq: Three times a day (TID) | INTRAMUSCULAR | Status: DC
  Administered 2013-02-27 – 2013-03-05 (×17): 5000 [IU] via SUBCUTANEOUS
  Filled 2013-02-27 (×21): qty 1

## 2013-02-27 MED ORDER — OXYCODONE HCL 5 MG/5ML PO SOLN: 5.0000 mg | Freq: Once | ORAL | Status: DC | PRN

## 2013-02-27 MED ORDER — NEOSTIGMINE METHYLSULFATE 1 MG/ML IJ SOLN
INTRAMUSCULAR | Status: DC | PRN
  Administered 2013-02-27: 4 mg via INTRAVENOUS

## 2013-02-27 MED ORDER — LIDOCAINE HCL (CARDIAC) 20 MG/ML IV SOLN
INTRAVENOUS | Status: DC | PRN
  Administered 2013-02-27: 100 mg via INTRAVENOUS

## 2013-02-27 MED ORDER — ROCURONIUM BROMIDE 100 MG/10ML IV SOLN
INTRAVENOUS | Status: DC | PRN
  Administered 2013-02-27: 10 mg via INTRAVENOUS
  Administered 2013-02-27: 50 mg via INTRAVENOUS

## 2013-02-27 MED ORDER — ONDANSETRON HCL 4 MG PO TABS: 4.0000 mg | ORAL_TABLET | Freq: Four times a day (QID) | ORAL | Status: DC | PRN

## 2013-02-27 MED ORDER — FENTANYL CITRATE 0.05 MG/ML IJ SOLN
INTRAMUSCULAR | Status: DC | PRN
  Administered 2013-02-27 (×3): 50 ug via INTRAVENOUS
  Administered 2013-02-27: 150 ug via INTRAVENOUS
  Administered 2013-02-27 (×2): 50 ug via INTRAVENOUS

## 2013-02-27 MED ORDER — BUPIVACAINE 0.25 % ON-Q PUMP DUAL CATH 300 ML
300.0000 mL | INJECTION | Status: DC
  Filled 2013-02-27 (×2): qty 300

## 2013-02-27 MED ORDER — KETOROLAC TROMETHAMINE 15 MG/ML IJ SOLN: 15.0000 mg | Freq: Four times a day (QID) | INTRAMUSCULAR | Status: DC | PRN

## 2013-02-27 MED ORDER — LIDOCAINE HCL (PF) 1 % IJ SOLN
INTRAMUSCULAR | Status: AC
  Filled 2013-02-27: qty 30

## 2013-02-27 MED ORDER — GLYCOPYRROLATE 0.2 MG/ML IJ SOLN
INTRAMUSCULAR | Status: DC | PRN
  Administered 2013-02-27: .6 mg via INTRAVENOUS

## 2013-02-27 MED ORDER — BUPIVACAINE-EPINEPHRINE PF 0.25-1:200000 % IJ SOLN
INTRAMUSCULAR | Status: AC
  Filled 2013-02-27: qty 30

## 2013-02-27 MED ORDER — SODIUM CHLORIDE 0.9 % IV SOLN
10.0000 mg | INTRAVENOUS | Status: DC | PRN
  Administered 2013-02-27: 40 ug/min via INTRAVENOUS

## 2013-02-27 MED ORDER — ONDANSETRON HCL 4 MG/2ML IJ SOLN
4.0000 mg | Freq: Four times a day (QID) | INTRAMUSCULAR | Status: DC | PRN
  Filled 2013-02-27: qty 2

## 2013-02-27 MED ORDER — ONDANSETRON HCL 4 MG/2ML IJ SOLN
INTRAMUSCULAR | Status: DC | PRN
  Administered 2013-02-27: 4 mg via INTRAVENOUS

## 2013-02-27 MED ORDER — DEXTROSE 5 % IV SOLN
1.0000 g | Freq: Four times a day (QID) | INTRAVENOUS | Status: AC
  Administered 2013-02-27 – 2013-02-28 (×3): 1 g via INTRAVENOUS
  Filled 2013-02-27 (×3): qty 1

## 2013-02-27 MED ORDER — BUPIVACAINE ON-Q PAIN PUMP (FOR ORDER SET NO CHG)
INJECTION | Status: DC
  Filled 2013-02-27: qty 1

## 2013-02-27 MED ORDER — PANTOPRAZOLE SODIUM 40 MG IV SOLR
40.0000 mg | Freq: Every day | INTRAVENOUS | Status: DC
  Administered 2013-02-27 – 2013-03-02 (×4): 40 mg via INTRAVENOUS
  Filled 2013-02-27 (×7): qty 40

## 2013-02-27 MED ORDER — SODIUM CHLORIDE 0.9 % IJ SOLN: 9.0000 mL | INTRAMUSCULAR | Status: DC | PRN

## 2013-02-27 MED ORDER — HYDROMORPHONE HCL PF 1 MG/ML IJ SOLN
INTRAMUSCULAR | Status: AC
  Filled 2013-02-27: qty 1

## 2013-02-27 MED ORDER — BUPIVACAINE 0.25 % ON-Q PUMP SINGLE CATH 300ML
300.0000 mL | INJECTION | Status: DC
Start: 2013-02-27 — End: 2013-02-27
  Filled 2013-02-27: qty 300

## 2013-02-27 MED ORDER — EVICEL 5 ML EX KIT
PACK | CUTANEOUS | Status: DC | PRN
  Administered 2013-02-27: 5 mL

## 2013-02-27 MED ORDER — OXYCODONE HCL 5 MG PO TABS: 5.0000 mg | ORAL_TABLET | Freq: Once | ORAL | Status: DC | PRN

## 2013-02-27 MED ORDER — METOCLOPRAMIDE HCL 5 MG/ML IJ SOLN: 10.0000 mg | Freq: Once | INTRAMUSCULAR | Status: DC | PRN

## 2013-02-27 MED ORDER — NALOXONE HCL 0.4 MG/ML IJ SOLN: 0.4000 mg | INTRAMUSCULAR | Status: DC | PRN

## 2013-02-27 MED ORDER — EVICEL 5 ML EX KIT
PACK | CUTANEOUS | Status: AC
  Filled 2013-02-27: qty 1

## 2013-02-27 MED ORDER — MORPHINE SULFATE (PF) 1 MG/ML IV SOLN
INTRAVENOUS | Status: AC
  Filled 2013-02-27: qty 25

## 2013-02-27 MED ORDER — POLYVINYL ALCOHOL 1.4 % OP SOLN
1.0000 [drp] | Freq: Every day | OPHTHALMIC | Status: DC | PRN
  Filled 2013-02-27: qty 15

## 2013-02-27 MED ORDER — 0.9 % SODIUM CHLORIDE (POUR BTL) OPTIME
TOPICAL | Status: DC | PRN
  Administered 2013-02-27: 2000 mL

## 2013-02-27 MED ORDER — LACTATED RINGERS IV SOLN
INTRAVENOUS | Status: DC
  Administered 2013-02-27 (×2): via INTRAVENOUS

## 2013-02-27 MED ORDER — HYDROMORPHONE HCL PF 1 MG/ML IJ SOLN
0.2500 mg | INTRAMUSCULAR | Status: DC | PRN
  Administered 2013-02-27 (×2): 0.5 mg via INTRAVENOUS

## 2013-02-27 MED ORDER — ONDANSETRON HCL 4 MG/2ML IJ SOLN
4.0000 mg | Freq: Four times a day (QID) | INTRAMUSCULAR | Status: DC | PRN
  Administered 2013-02-28 – 2013-03-02 (×2): 4 mg via INTRAVENOUS
  Filled 2013-02-27: qty 2

## 2013-02-27 MED ORDER — MORPHINE SULFATE (PF) 1 MG/ML IV SOLN
INTRAVENOUS | Status: DC
Start: 2013-02-27 — End: 2013-03-03
  Administered 2013-02-27: 10.1 mg via INTRAVENOUS
  Administered 2013-02-27: 22:00:00 via INTRAVENOUS
  Administered 2013-02-27: 18 mg via INTRAVENOUS
  Administered 2013-02-27 – 2013-02-28 (×2): via INTRAVENOUS
  Administered 2013-02-28: 3 mg via INTRAVENOUS
  Administered 2013-02-28: 13.5 mg via INTRAVENOUS
  Administered 2013-02-28: 15:00:00 via INTRAVENOUS
  Administered 2013-02-28: 12 mg via INTRAVENOUS
  Administered 2013-03-01: 07:00:00 via INTRAVENOUS
  Administered 2013-03-01: 5.98 mg via INTRAVENOUS
  Administered 2013-03-01: 17:00:00 via INTRAVENOUS
  Administered 2013-03-01: 7.5 mg via INTRAVENOUS
  Administered 2013-03-02: 3 mg via INTRAVENOUS
  Administered 2013-03-02: 08:00:00 via INTRAVENOUS
  Administered 2013-03-02: 13.25 mg via INTRAVENOUS
  Administered 2013-03-02: 1.5 mg via INTRAVENOUS
  Administered 2013-03-02 (×2): 3 mg via INTRAVENOUS
  Administered 2013-03-03: 4.5 mg via INTRAVENOUS
  Filled 2013-02-27 (×6): qty 25

## 2013-02-27 MED ORDER — DIPHENHYDRAMINE HCL 12.5 MG/5ML PO ELIX: 12.5000 mg | ORAL_SOLUTION | Freq: Four times a day (QID) | ORAL | Status: DC | PRN

## 2013-02-27 SURGICAL SUPPLY — 121 items
ADH SKN CLS APL DERMABOND .7 (GAUZE/BANDAGES/DRESSINGS) ×1
APL SRG 32X5 SNPLK LF DISP (MISCELLANEOUS) ×1
APPLIER CLIP 5 13 M/L LIGAMAX5 (MISCELLANEOUS) ×2
APR CLP MED LRG 5 ANG JAW (MISCELLANEOUS) ×1
BAG BILE T-TUBES STRL (MISCELLANEOUS) IMPLANT
BAG DRN 9.5 2 ADJ BELT ADPR (MISCELLANEOUS)
BLADE SURG 10 STRL SS (BLADE) ×1 IMPLANT
BLADE SURG ROTATE 9660 (MISCELLANEOUS) IMPLANT
BOOT SUTURE AID YELLOW STND (SUTURE) IMPLANT
CANISTER SUCTION 2500CC (MISCELLANEOUS) ×2 IMPLANT
CATH FOLEY 2WAY SLVR  5CC 22FR (CATHETERS)
CATH FOLEY 2WAY SLVR 5CC 22FR (CATHETERS) IMPLANT
CATH KIT ON Q 5IN SLV (PAIN MANAGEMENT) ×2 IMPLANT
CATH KIT ON Q 7.5IN SLV (PAIN MANAGEMENT) IMPLANT
CATH ROBINSON RED A/P 14FR (CATHETERS) IMPLANT
CATH ROBINSON RED A/P 16FR (CATHETERS) IMPLANT
CHLORAPREP W/TINT 26ML (MISCELLANEOUS) ×2 IMPLANT
CLIP APPLIE 5 13 M/L LIGAMAX5 (MISCELLANEOUS) IMPLANT
CLIP LIGATING HEM O LOK PURPLE (MISCELLANEOUS) IMPLANT
CLIP LIGATING HEMO O LOK GREEN (MISCELLANEOUS) IMPLANT
CLIP LIGATING HEMOLOK MED (MISCELLANEOUS) IMPLANT
CLIP TI LARGE 6 (CLIP) IMPLANT
CLIP TI MEDIUM 24 (CLIP) IMPLANT
CLIP TI WIDE RED SMALL 24 (CLIP) IMPLANT
CLOTH BEACON ORANGE TIMEOUT ST (SAFETY) ×2 IMPLANT
CONNECTOR 5 IN 1 STRAIGHT STRL (MISCELLANEOUS) ×1 IMPLANT
COVER SURGICAL LIGHT HANDLE (MISCELLANEOUS) ×2 IMPLANT
DERMABOND ADVANCED (GAUZE/BANDAGES/DRESSINGS) ×1
DERMABOND ADVANCED .7 DNX12 (GAUZE/BANDAGES/DRESSINGS) IMPLANT
DRAIN CHANNEL 19F RND (DRAIN) ×2 IMPLANT
DRAIN PENROSE 1/2X36 STERILE (WOUND CARE) IMPLANT
DRAPE UTILITY 15X26 W/TAPE STR (DRAPE) ×4 IMPLANT
DRAPE WARM FLUID 44X44 (DRAPE) ×3 IMPLANT
DRESSING TELFA 8X3 (GAUZE/BANDAGES/DRESSINGS) IMPLANT
DRSG COVADERM 4X10 (GAUZE/BANDAGES/DRESSINGS) IMPLANT
DRSG COVADERM 4X14 (GAUZE/BANDAGES/DRESSINGS) IMPLANT
DRSG COVADERM 4X8 (GAUZE/BANDAGES/DRESSINGS) ×1 IMPLANT
DRSG PAD ABDOMINAL 8X10 ST (GAUZE/BANDAGES/DRESSINGS) IMPLANT
DRSG TEGADERM 4X4.75 (GAUZE/BANDAGES/DRESSINGS) ×1 IMPLANT
ELECT BLADE 6.5 EXT (BLADE) IMPLANT
ELECT CAUTERY BLADE 6.4 (BLADE) ×2 IMPLANT
ELECT REM PT RETURN 9FT ADLT (ELECTROSURGICAL) ×2
ELECTRODE REM PT RTRN 9FT ADLT (ELECTROSURGICAL) ×1 IMPLANT
EVACUATOR SILICONE 100CC (DRAIN) ×2 IMPLANT
GEL PDS (MISCELLANEOUS) ×1 IMPLANT
GLOVE BIO SURGEON STRL SZ 6 (GLOVE) ×3 IMPLANT
GLOVE BIOGEL PI IND STRL 6.5 (GLOVE) ×1 IMPLANT
GLOVE BIOGEL PI IND STRL 7.0 (GLOVE) IMPLANT
GLOVE BIOGEL PI IND STRL 8 (GLOVE) IMPLANT
GLOVE BIOGEL PI INDICATOR 6.5 (GLOVE) ×1
GLOVE BIOGEL PI INDICATOR 7.0 (GLOVE) ×2
GLOVE BIOGEL PI INDICATOR 8 (GLOVE) ×1
GLOVE ECLIPSE 8.0 STRL XLNG CF (GLOVE) ×1 IMPLANT
GLOVE SURG SS PI 7.0 STRL IVOR (GLOVE) ×1 IMPLANT
GOWN PREVENTION PLUS XXLARGE (GOWN DISPOSABLE) ×3 IMPLANT
GOWN STRL NON-REIN LRG LVL3 (GOWN DISPOSABLE) ×5 IMPLANT
HEMOSTAT SURGICEL 2X14 (HEMOSTASIS) IMPLANT
KIT BASIN OR (CUSTOM PROCEDURE TRAY) ×2 IMPLANT
KIT ROOM TURNOVER OR (KITS) ×2 IMPLANT
LOOP VESSEL MAXI BLUE (MISCELLANEOUS) IMPLANT
NDL BIOPSY 14X6 SOFT TISS (NEEDLE) IMPLANT
NEEDLE BIOPSY 14X6 SOFT TISS (NEEDLE) IMPLANT
NS IRRIG 1000ML POUR BTL (IV SOLUTION) ×2 IMPLANT
PACK LAPAROSCOPIC ABD 0248 (SET/KITS/TRAYS/PACK) ×1 IMPLANT
PAD ARMBOARD 7.5X6 YLW CONV (MISCELLANEOUS) ×4 IMPLANT
PAD SHARPS MAGNETIC DISPOSAL (MISCELLANEOUS) IMPLANT
PENCIL BUTTON HOLSTER BLD 10FT (ELECTRODE) ×2 IMPLANT
PLUG CATH AND CAP STER (CATHETERS) IMPLANT
RELOAD BLUE (STAPLE) ×2 IMPLANT
RELOAD WHITE ECR60W (STAPLE) ×2 IMPLANT
SCALPEL HARMONIC ACE (MISCELLANEOUS) ×2 IMPLANT
SCISSORS HARMONIC WAVE 18CM (INSTRUMENTS) IMPLANT
SCISSORS LAP 5X35 DISP (ENDOMECHANICALS) ×1 IMPLANT
SEALANT SURGICAL APPL DUAL CAN (MISCELLANEOUS) ×2 IMPLANT
SET IRRIG TUBING LAPAROSCOPIC (IRRIGATION / IRRIGATOR) ×2 IMPLANT
SLEEVE ENDOPATH XCEL 5M (ENDOMECHANICALS) ×5 IMPLANT
SLEEVE SURGEON STRL (DRAPES) ×3 IMPLANT
SPONGE GAUZE 4X4 12PLY (GAUZE/BANDAGES/DRESSINGS) ×1 IMPLANT
SPONGE INTESTINAL PEANUT (DISPOSABLE) IMPLANT
STAPLE ECHEON FLEX 60 POW ENDO (STAPLE) ×2 IMPLANT
STAPLER STANDARD HANDLE (STAPLE) IMPLANT
STAPLER VISISTAT 35W (STAPLE) ×2 IMPLANT
STRIP CLOSURE SKIN 1/2X4 (GAUZE/BANDAGES/DRESSINGS) ×1 IMPLANT
STRIP PERI DRY VERITAS 60 (STAPLE) ×2 IMPLANT
SUT CHROMIC 3 0 SH 27 (SUTURE) IMPLANT
SUT CHROMIC 4 0 RB 1X27 (SUTURE) IMPLANT
SUT ETHILON 2 0 FS 18 (SUTURE) ×1 IMPLANT
SUT MNCRL AB 4-0 PS2 18 (SUTURE) ×1 IMPLANT
SUT PDS AB 1 TP1 96 (SUTURE) IMPLANT
SUT PDS AB 3-0 SH 27 (SUTURE) IMPLANT
SUT PDS AB 4-0 RB1 27 (SUTURE) ×6 IMPLANT
SUT PDS II 0 TP-1 LOOPED 60 (SUTURE) ×4 IMPLANT
SUT PROLENE 3 0 SH 48 (SUTURE) IMPLANT
SUT PROLENE 4 0 RB 1 (SUTURE)
SUT PROLENE 4 0 SH DA (SUTURE) IMPLANT
SUT PROLENE 4-0 RB1 .5 CRCL 36 (SUTURE) IMPLANT
SUT PROLENE 5 0 C1 (SUTURE) IMPLANT
SUT SILK 2 0 SH CR/8 (SUTURE) ×2 IMPLANT
SUT SILK 2 0 TIES 10X30 (SUTURE) ×2 IMPLANT
SUT SILK 3 0 SH CR/8 (SUTURE) ×2 IMPLANT
SUT SILK 3 0 TIES 10X30 (SUTURE) ×2 IMPLANT
SUT SILK 3 0SH CR/8 30 (SUTURE) IMPLANT
SUT VIC AB 3-0 SH 18 (SUTURE) IMPLANT
SUT VIC AB 5-0 TF 27 (SUTURE) IMPLANT
SUT VICRYL AB 2 0 TIES (SUTURE) IMPLANT
SYS LAPSCP GELPORT 120MM (MISCELLANEOUS)
SYSTEM LAPSCP GELPORT 120MM (MISCELLANEOUS) IMPLANT
TAPE CLOTH SURG 4X10 WHT LF (GAUZE/BANDAGES/DRESSINGS) ×1 IMPLANT
TAPE UMBILICAL 1/8 X36 TWILL (MISCELLANEOUS) IMPLANT
TOWEL OR 17X24 6PK STRL BLUE (TOWEL DISPOSABLE) ×2 IMPLANT
TOWEL OR 17X26 10 PK STRL BLUE (TOWEL DISPOSABLE) ×2 IMPLANT
TRAY FOLEY CATH 14FRSI W/METER (CATHETERS) ×2 IMPLANT
TRAY LAPAROSCOPIC (CUSTOM PROCEDURE TRAY) ×2 IMPLANT
TROCAR XCEL 12X100 BLDLESS (ENDOMECHANICALS) ×2 IMPLANT
TROCAR XCEL BLUNT TIP 100MML (ENDOMECHANICALS) ×2 IMPLANT
TROCAR XCEL NON-BLD 5MMX100MML (ENDOMECHANICALS) ×2 IMPLANT
TUBE CONNECTING 12X1/4 (SUCTIONS) ×2 IMPLANT
TUBING FILTER THERMOFLATOR (ELECTROSURGICAL) ×2 IMPLANT
TUNNELER SHEATH ON-Q 11GX8 DSP (PAIN MANAGEMENT) ×2 IMPLANT
WATER STERILE IRR 1000ML POUR (IV SOLUTION) ×2 IMPLANT
YANKAUER SUCT BULB TIP NO VENT (SUCTIONS) ×2 IMPLANT

## 2013-02-27 NOTE — Transfer of Care (Signed)
Immediate Anesthesia Transfer of Care Note  Patient: Jon Pena  Procedure(s) Performed: Procedure(s): LAPAROSCOPIC PANCREATECTOMY (N/A) LAPAROSCOPIC SPLENECTOMY (N/A)  Patient Location: PACU  Anesthesia Type:General  Level of Consciousness: sedated, patient cooperative and responds to stimulation  Airway & Oxygen Therapy: Patient Spontanous Breathing and Patient connected to face mask oxygen  Post-op Assessment: Report given to PACU RN, Post -op Vital signs reviewed and stable and Patient moving all extremities  Post vital signs: Reviewed and stable  Complications: No apparent anesthesia complications

## 2013-02-27 NOTE — Anesthesia Procedure Notes (Signed)
Procedure Name: Intubation Date/Time: 02/27/2013 11:56 AM Performed by: Rogelia Boga Pre-anesthesia Checklist: Patient identified, Emergency Drugs available, Suction available, Patient being monitored and Timeout performed Patient Re-evaluated:Patient Re-evaluated prior to inductionOxygen Delivery Method: Circle system utilized Preoxygenation: Pre-oxygenation with 100% oxygen Intubation Type: IV induction Ventilation: Mask ventilation without difficulty Laryngoscope Size: Mac and 4 Grade View: Grade I Tube type: Oral Tube size: 7.5 mm Number of attempts: 1 Airway Equipment and Method: Stylet Placement Confirmation: ETT inserted through vocal cords under direct vision,  positive ETCO2 and breath sounds checked- equal and bilateral Secured at: 23 cm Tube secured with: Tape Dental Injury: Teeth and Oropharynx as per pre-operative assessment

## 2013-02-27 NOTE — Preoperative (Signed)
Beta Blockers   Reason not to administer Beta Blockers:Not Applicable 

## 2013-02-27 NOTE — Anesthesia Postprocedure Evaluation (Signed)
Anesthesia Post Note  Patient: Jon Pena  Procedure(s) Performed: Procedure(s) (LRB): LAPAROSCOPIC PANCREATECTOMY (N/A) LAPAROSCOPIC SPLENECTOMY (N/A)  Anesthesia type: general  Patient location: PACU  Post pain: Pain level controlled  Post assessment: Patient's Cardiovascular Status Stable  Post vital signs: Reviewed and stable  Level of consciousness: sedated  Complications: No apparent anesthesia complications

## 2013-02-27 NOTE — Interval H&P Note (Signed)
History and Physical Interval Note:  02/27/2013 11:06 AM  Jon Pena  has presented today for surgery, with the diagnosis of distal pancreatic mass   The various methods of treatment have been discussed with the patient and family. After consideration of risks, benefits and other options for treatment, the patient has consented to  Procedure(s): LAPAROSCOPIC PANCREATECTOMY (N/A) LAPAROSCOPIC SPLENECTOMY (N/A) as a surgical intervention .  The patient's history has been reviewed, patient examined, no change in status, stable for surgery.  I have reviewed the patient's chart and labs.  Questions were answered to the patient's satisfaction.     Primus Gritton

## 2013-02-27 NOTE — Anesthesia Preprocedure Evaluation (Addendum)
Anesthesia Evaluation  Patient identified by MRN, date of birth, ID band Patient awake    Reviewed: Allergy & Precautions, H&P , NPO status , Patient's Chart, lab work & pertinent test results, reviewed documented beta blocker date and time   Airway Mallampati: II TM Distance: >3 FB Neck ROM: full    Dental  (+) Dental Advisory Given   Pulmonary shortness of breath and with exertion, former smoker,  breath sounds clear to auscultation        Cardiovascular negative cardio ROS  Rhythm:regular     Neuro/Psych negative neurological ROS  negative psych ROS   GI/Hepatic Neg liver ROS, GERD-  Medicated and Controlled,  Endo/Other  negative endocrine ROS  Renal/GU negative Renal ROS  negative genitourinary   Musculoskeletal   Abdominal   Peds  Hematology negative hematology ROS (+)   Anesthesia Other Findings See surgeon's H&P   Reproductive/Obstetrics negative OB ROS                          Anesthesia Physical Anesthesia Plan  ASA: II  Anesthesia Plan: General   Post-op Pain Management:    Induction: Intravenous  Airway Management Planned: Oral ETT  Additional Equipment:   Intra-op Plan:   Post-operative Plan: Extubation in OR  Informed Consent: I have reviewed the patients History and Physical, chart, labs and discussed the procedure including the risks, benefits and alternatives for the proposed anesthesia with the patient or authorized representative who has indicated his/her understanding and acceptance.   Dental Advisory Given and Dental advisory given  Plan Discussed with: CRNA, Surgeon and Anesthesiologist  Anesthesia Plan Comments:        Anesthesia Quick Evaluation

## 2013-02-27 NOTE — H&P (View-Only) (Signed)
Chief Complaint  Patient presents with  . New Evaluation    Pancreatic mass    HISTORY: Patient is a 62-year-old male who was discovered incidentally to have a distal pancreatic mass. He is referred by Dr. William McGough.  He recently had appendicitis and his CT scan demonstrated a 4.5 cm mass in the tail of the pancreas. This did appear to be solid and not cystic. He did not have any evidence of pancreatitis on the scan. There was no evidence of any other masses in the liver or lymph node involvement. He denies any weight loss. He has not had any fevers. He did have an aunt with breast cancer and a father with pancreatitis.  He drinks occasionally approximately one alcoholic beverage every 2 weeks. He doesn't smoke. He has tried recently quit. He plans on quitting. He is not have any chronic medical problems. He does play golf frequently and reasonable physical condition.  History reviewed. No pertinent past medical history.  Past Surgical History  Procedure Laterality Date  . Laparoscopic appendectomy N/A 01/26/2013    Procedure: APPENDECTOMY LAPAROSCOPIC;  Surgeon: Burke E Thompson, MD;  Location: [facility] OR;  Service: General;  Laterality: N/A;  . Appendectomy      Current Outpatient Prescriptions  Medication Sig Dispense Refill  . Naproxen Sodium (ALEVE PO) Take 1 capsule by mouth once.       No current facility-administered medications for this visit.     No Known Allergies   Family History  Problem Relation Age of Onset  . Cancer Paternal Aunt     breast     History   Social History  . Marital Status: Divorced    Spouse Name: N/A    Number of Children: N/A  . Years of Education: N/A   Social History Main Topics  . Smoking status: Former Smoker    Types: Cigarettes    Quit date: 02/11/2013  . Smokeless tobacco: Never Used  . Alcohol Use: Yes  . Drug Use: No  . Sexual Activity: Yes    REVIEW OF SYSTEMS - PERTINENT POSITIVES ONLY: 12 point review of systems  negative other than HPI and PMH  EXAM: Filed Vitals:   02/11/13 0940  BP: 130/82  Pulse: 60  Resp: 14   Filed Weights   02/11/13 0940  Weight: 215 lb 6.4 oz (97.705 kg)     Gen:  No acute distress.  Well nourished and well groomed.   Neurological: Alert and oriented to person, place, and time. Coordination normal.  Head: Normocephalic and atraumatic.  Eyes: Conjunctivae are normal. Pupils are equal, round, and reactive to light. No scleral icterus.  Neck: Normal range of motion. Neck supple. No tracheal deviation or thyromegaly present.  Cardiovascular: Normal rate, regular rhythm, normal heart sounds and intact distal pulses.  Exam reveals no gallop and no friction rub.  No murmur heard. Respiratory: Effort normal.  No respiratory distress. No chest wall tenderness. Breath sounds normal.  No wheezes, rales or rhonchi.  GI: Soft. Bowel sounds are normal. The abdomen is soft and nontender.  There is no rebound and no guarding. Appendectomy scars are healing, no evidence of infection Musculoskeletal: Normal range of motion. Extremities are nontender.  Lymphadenopathy: No cervical, preauricular, postauricular or axillary adenopathy is present Skin: Skin is warm and dry. No rash noted. No diaphoresis. No erythema. No pallor. No clubbing, cyanosis, or edema.   Psychiatric: Normal mood and affect. Behavior is normal. Judgment and thought content normal.    LABORATORY   RESULTS: Available labs are reviewed  CA 19-9 is 4.8  RADIOLOGY RESULTS: See E-Chart or I-Site for most recent results.  Images and reports are reviewed. CT abd/pel IMPRESSION:  Inflamed enlarged appendix measuring up to 10.8 cm consistent with  appendicitis without adjacent abscess. The appendix is located  anterior to the cecum.  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 Malignancy is a distinct  possibility (less likely consideration is a result of focal  pancreatitis). No surrounding adenopathy. No splenic vein   thrombosis.  Mild fatty infiltration of the liver without focal worrisome mass.  Left renal 2.8 cm cyst. Right renal sub centimeter low density  structure may be a cyst although too small to characterize.  Tiny gallstones.  Atherosclerotic type changes mid to lower abdominal aorta without  aneurysmal dilation. Atherosclerotic type changes iliac arteries  without high-grade stenosis or aneurysm.  No bony destructive lesion. Degenerative changes lower lumbar  spine.  Cardiomegaly.    ASSESSMENT AND PLAN: Pancreatic mass Plan MRI/MRCP  Plan EUS to evaluate if this is malignant.  Will also set up surgery for distal pancreatectomy/possible splenectomy.  He has already been vaccinated for post splenectomy vaccines against encapsulated organisms.    His CA 19-9 is low, this certainly could be neuroendocrine tumor, rather than pancreatic adenocarcinoma.   He will almost definitely need resection unless the mass was a sequelae of pancreatitis and has resolved.  I discussed risks of surgery including bleeding, infection, damage to adjacent structures, pancreatic leak, diabetes, pancreatic exocrine insufficiency.    40 min spent in consultation, >50% in counseling.       Melinna Linarez L Lauramae Kneisley MD Surgical Oncology, General and Endocrine Surgery [facility]      Visit Diagnoses: 1. Pancreatic mass     Primary Care Physician: MCGOUGH,WILLIAM M, MD    

## 2013-02-27 NOTE — Op Note (Signed)
PREOPERATIVE DIAGNOSIS:  cT2 N0 M0 pancreatic cancer.      POSTOPERATIVE DIAGNOSIS:  Same      PROCEDURE:  Diagnostic laparoscopy, laparoscopic hand-assisted distal   pancreatectomy and splenectomy.      SURGEON:  Almond Lint, MD      ASSISTANT:  Avel Peace, M.D.      ANESTHESIA:  General and local.      FINDINGS:  Mass adherent to splenic hilum.      SPECIMEN:  Distal pancreas and spleen to Pathology.      ESTIMATED BLOOD LOSS:  100 mL      COMPLICATIONS:  None known.      PROCEDURE:  Patient was identified in the holding area and taken to   operating room where he was placed supine on the operating room table.   General anesthesia was induced.  Foley catheter was placed.  He was   placed in the leaning spleen position.  Her abdomen was prepped and   draped in sterile fashion.  Time-out was performed according to surgical   safety check list.  When all was correct, we continued.    5 mm Optiview port was placed at the left subcostal margin after administration of local anesthetic.  Pneumoperitoneum was achieved.        Two additional 5 mm ports were placed in the midline and then 2 were placed in the left upper quadrant as well.  These were  all 5 mm trocars.  The lienocolic ligament was  taken down with the Harmonic.  The lesser sac was opened with the Harmonic and all the adhesions were taken down.  Once the   stomach was completely off the spleen, the inferior border of the   pancreas was identified and this was opened up with the Harmonic.  The   hand port was placed in the upper midline.  The pancreas and the splenic   artery and vein were elevated.     The pancreas was then isolated off the vessels and the vessels were then stapled with a vascular load.  The pancreas was stapled with the blue load with Peristrips. The 12 mm trocar was placed through the GelPort. The posterior attachments were then taken   off with the Harmonic scalpel and the spleen was disconnected  from the   posterior attachments.  The specimen was then removed from the hand   port.  The area was inspected for hemostasis and there was a small area   at the end of pancreas that was clipped.    The abdomen was copiously irrigated and there was   no sign of any additional bleeding.  Evicel was placed over the tail of   the pancreas.  The pancreatic specimen was examined and the duct of the   pancreas was seen to be in the staple line.     incision was closed with the EndoCatch and a 0 Vicryl.  This was   airtight.  The 19 Blake drain was then passed into the abdomen through   the hand port and pulled out through one of the other 5 mm trocars.   This was placed in the appropriate location.    At this point, the other 5   mm trocar was removed and the fascia from the hand port was closed with   interrupted #1 with Novafil sutures.  The skin of all the incisions was   then closed with 4-0 Monocryl in a subcuticular fashion and then dressed  with benzoin, Steri-Strips, and soft dressings.  The patient tolerated   the procedure well.  She was extubated and taken to the PACU in stable   condition.  Needle, sponge, and instrument counts were correct x2               Almond Lint, MD

## 2013-02-28 ENCOUNTER — Encounter (HOSPITAL_COMMUNITY): Payer: Self-pay | Admitting: General Surgery

## 2013-02-28 DIAGNOSIS — R7309 Other abnormal glucose: Secondary | ICD-10-CM

## 2013-02-28 LAB — GLUCOSE, CAPILLARY
Glucose-Capillary: 108 mg/dL — ABNORMAL HIGH (ref 70–99)
Glucose-Capillary: 99 mg/dL (ref 70–99)

## 2013-02-28 LAB — CBC
HCT: 42.6 % (ref 39.0–52.0)
Hemoglobin: 14.5 g/dL (ref 13.0–17.0)
MCHC: 34 g/dL (ref 30.0–36.0)
RBC: 4.71 MIL/uL (ref 4.22–5.81)

## 2013-02-28 LAB — BASIC METABOLIC PANEL
BUN: 21 mg/dL (ref 6–23)
CO2: 25 mEq/L (ref 19–32)
GFR calc non Af Amer: 86 mL/min — ABNORMAL LOW (ref >90)
Glucose, Bld: 166 mg/dL — ABNORMAL HIGH (ref 70–99)
Potassium: 4.8 mEq/L (ref 3.5–5.1)

## 2013-02-28 MED ORDER — INSULIN ASPART 100 UNIT/ML ~~LOC~~ SOLN
0.0000 [IU] | SUBCUTANEOUS | Status: DC
  Administered 2013-03-01: 2 [IU] via SUBCUTANEOUS
  Administered 2013-03-01: 08:00:00 via SUBCUTANEOUS
  Administered 2013-03-01: 2 [IU] via SUBCUTANEOUS
  Administered 2013-03-03: 3 [IU] via SUBCUTANEOUS
  Administered 2013-03-03 – 2013-03-05 (×6): 2 [IU] via SUBCUTANEOUS

## 2013-02-28 MED ORDER — PHENOL 1.4 % MT LIQD
2.0000 | OROMUCOSAL | Status: DC | PRN
  Administered 2013-02-28: 2 via OROMUCOSAL
  Filled 2013-02-28: qty 177

## 2013-02-28 NOTE — Progress Notes (Signed)
1 Day Post-Op  Subjective: Denies pain.  Some nausea last night.    Objective: Vital signs in last 24 hours: Temp:  [97.2 F (36.2 C)-98.2 F (36.8 C)] 97.7 F (36.5 C) (09/12 0828) Pulse Rate:  [81-103] 86 (09/12 0828) Resp:  [12-19] 18 (09/12 0828) BP: (107-175)/(70-101) 119/76 mmHg (09/12 0828) SpO2:  [93 %-99 %] 96 % (09/12 0828)    Intake/Output from previous day: 09/11 0701 - 09/12 0700 In: 4455 [I.V.:4105; IV Piggyback:350] Out: 1535 [Urine:1150; Emesis/NG output:300; Drains:85] Intake/Output this shift:    General appearance: alert, cooperative and no distress Resp: breathing comfortably GI: soft, approp tender, sl distended.    Lab Results:   Recent Labs  02/27/13 1822 02/28/13 0540  WBC 15.4* 13.1*  HGB 15.1 14.5  HCT 43.5 42.6  PLT 210 189   BMET  Recent Labs  02/27/13 1822 02/28/13 0540  NA  --  133*  K  --  4.8  CL  --  98  CO2  --  25  GLUCOSE  --  166*  BUN  --  21  CREATININE 0.86 0.99  CALCIUM  --  8.1*   PT/INR No results found for this basename: LABPROT, INR,  in the last 72 hours ABG No results found for this basename: PHART, PCO2, PO2, HCO3,  in the last 72 hours  Studies/Results: No results found.  Anti-infectives: Anti-infectives   Start     Dose/Rate Route Frequency Ordered Stop   02/27/13 1800  cefOXitin (MEFOXIN) 1 g in dextrose 5 % 50 mL IVPB     1 g 100 mL/hr over 30 Minutes Intravenous Every 6 hours 02/27/13 1645 02/28/13 0553   02/27/13 1645  acyclovir (ZOVIRAX) tablet 800 mg  Status:  Discontinued     800 mg Oral 4 times daily PRN 02/27/13 1645 02/27/13 1654   02/27/13 0600  cefOXitin (MEFOXIN) 2 g in dextrose 5 % 50 mL IVPB     2 g 100 mL/hr over 30 Minutes Intravenous On call to O.R. 02/26/13 1437 02/27/13 1205      Assessment/Plan: s/p Procedure(s): LAPAROSCOPIC PANCREATECTOMY (N/A) LAPAROSCOPIC SPLENECTOMY (N/A) d/c foley d/c NGT Add SSI for hyperglycemia. Await pathology.  LOS: 1 day     [facility] 02/28/2013

## 2013-02-28 NOTE — Progress Notes (Addendum)
During sleep, patient's O2 sats drop to 87-88% on nasal cannula 3 liters. He is mouth breathing. When awake he sats 92-95 % on 3 liters nasal cannula. O2 increased to 4L while sleeping. As he falls asleep he is not breathing through his nose and maintaining his sats, I put a mask on his face and his sats came up to 94%. All other VSS. Will continue to monitor.

## 2013-03-01 ENCOUNTER — Inpatient Hospital Stay (HOSPITAL_COMMUNITY): Payer: BC Managed Care – PPO

## 2013-03-01 LAB — CBC
Platelets: 183 10*3/uL (ref 150–400)
RDW: 14 % (ref 11.5–15.5)
WBC: 18.9 10*3/uL — ABNORMAL HIGH (ref 4.0–10.5)

## 2013-03-01 LAB — URINALYSIS, ROUTINE W REFLEX MICROSCOPIC
Bilirubin Urine: NEGATIVE
Ketones, ur: NEGATIVE mg/dL
Nitrite: NEGATIVE
Protein, ur: NEGATIVE mg/dL
Specific Gravity, Urine: 1.017 (ref 1.005–1.030)
Urobilinogen, UA: 0.2 mg/dL (ref 0.0–1.0)

## 2013-03-01 LAB — GLUCOSE, CAPILLARY
Glucose-Capillary: 139 mg/dL — ABNORMAL HIGH (ref 70–99)
Glucose-Capillary: 84 mg/dL (ref 70–99)

## 2013-03-01 LAB — BASIC METABOLIC PANEL
Chloride: 96 mEq/L (ref 96–112)
Creatinine, Ser: 0.9 mg/dL (ref 0.50–1.35)
GFR calc Af Amer: >90 mL/min (ref >90)
GFR calc non Af Amer: 89 mL/min — ABNORMAL LOW (ref >90)
Potassium: 5 mEq/L (ref 3.5–5.1)

## 2013-03-01 MED ORDER — SODIUM CHLORIDE 0.9 % IV SOLN
INTRAVENOUS | Status: DC
  Administered 2013-03-01 – 2013-03-02 (×2): via INTRAVENOUS

## 2013-03-01 MED ORDER — FLEET ENEMA 7-19 GM/118ML RE ENEM
1.0000 | ENEMA | Freq: Once | RECTAL | Status: AC
  Administered 2013-03-01: 1 via RECTAL
  Filled 2013-03-01: qty 1

## 2013-03-01 NOTE — Progress Notes (Addendum)
2 Days Post-Op  Subjective: Alert and stable. Does not look ill or toxic. Cooperative. Mental status normal.  Mild hypoxia noted by RN last night. Does not feel short of breath. Denies nausea. No flatus or stool yet.  Objective: Vital signs in last 24 hours: Temp:  [98.1 F (36.7 C)-99.6 F (37.6 C)] 98.3 F (36.8 C) (09/13 0616) Pulse Rate:  [81-98] 90 (09/13 0616) Resp:  [12-18] 14 (09/13 0724) BP: (108-120)/(59-75) 112/66 mmHg (09/13 0616) SpO2:  [92 %-99 %] 93 % (09/13 0724) Weight:  [215 lb 6.4 oz (97.705 kg)] 215 lb 6.4 oz (97.705 kg) (09/12 1101) Last BM Date: 02/26/13  Intake/Output from previous day: 09/12 0701 - 09/13 0700 In: 2833.3 [P.O.:100; I.V.:2733.3] Out: 1285 [Urine:1200; Drains:85] Intake/Output this shift:    General appearance: alert. Cooperative. Does not appear toxic.  Mental status normal. Oxygen nasal cannula in place. Resp: egophony at both bases. Clear in the upper lobes. No wheeze. GI: soft. Slightly protuberant. Bowel sounds are present. Wounds clean. Drainage thin, serosanguineous, bland, odorless.  Lab Results:  Results for orders placed during the hospital encounter of 02/27/13 (from the past 24 hour(s))  GLUCOSE, CAPILLARY     Status: Abnormal   Collection Time    02/28/13 12:43 PM      Result Value Range   Glucose-Capillary 108 (*) 70 - 99 mg/dL  GLUCOSE, CAPILLARY     Status: None   Collection Time    02/28/13  3:56 PM      Result Value Range   Glucose-Capillary 99  70 - 99 mg/dL   Comment 1 Notify RN    GLUCOSE, CAPILLARY     Status: None   Collection Time    02/28/13  8:06 PM      Result Value Range   Glucose-Capillary 99  70 - 99 mg/dL   Comment 1 Notify RN    GLUCOSE, CAPILLARY     Status: Abnormal   Collection Time    03/01/13 12:01 AM      Result Value Range   Glucose-Capillary 144 (*) 70 - 99 mg/dL  CBC     Status: Abnormal   Collection Time    03/01/13  4:40 AM      Result Value Range   WBC 18.9 (*) 4.0 - 10.5 K/uL    RBC 4.29  4.22 - 5.81 MIL/uL   Hemoglobin 12.9 (*) 13.0 - 17.0 g/dL   HCT 78.2  95.6 - 21.3 %   MCV 92.5  78.0 - 100.0 fL   MCH 30.1  26.0 - 34.0 pg   MCHC 32.5  30.0 - 36.0 g/dL   RDW 08.6  57.8 - 46.9 %   Platelets 183  150 - 400 K/uL  BASIC METABOLIC PANEL     Status: Abnormal   Collection Time    03/01/13  4:40 AM      Result Value Range   Sodium 128 (*) 135 - 145 mEq/L   Potassium 5.0  3.5 - 5.1 mEq/L   Chloride 96  96 - 112 mEq/L   CO2 23  19 - 32 mEq/L   Glucose, Bld 119 (*) 70 - 99 mg/dL   BUN 16  6 - 23 mg/dL   Creatinine, Ser 6.29  0.50 - 1.35 mg/dL   Calcium 8.2 (*) 8.4 - 10.5 mg/dL   GFR calc non Af Amer 89 (*) >90 mL/min   GFR calc Af Amer >90  >90 mL/min  GLUCOSE, CAPILLARY     Status:  Abnormal   Collection Time    03/01/13  4:47 AM      Result Value Range   Glucose-Capillary 139 (*) 70 - 99 mg/dL  GLUCOSE, CAPILLARY     Status: Abnormal   Collection Time    03/01/13  7:49 AM      Result Value Range   Glucose-Capillary 131 (*) 70 - 99 mg/dL   Comment 1 Notify RN       Studies/Results: @RISRSLT24 @  . heparin  5,000 Units Subcutaneous Q8H  . insulin aspart  0-15 Units Subcutaneous Q4H  . ketorolac  15 mg Intravenous Q6H  . morphine   Intravenous Q4H  . pantoprazole (PROTONIX) IV  40 mg Intravenous QHS  . sodium phosphate  1 enema Rectal Once     Assessment/Plan: s/p Procedure(s): LAPAROSCOPIC PANCREATECTOMY LAPAROSCOPIC SPLENECTOMY  POD #2. Stable. Ileus beginning to resolve. Allow limited clear liquids.Fleets enema. Pathology not completed.  Progressive leukocytosis. Significance unknown. ? Post op splenectomy. ? Pulmonary. We'll check urinalysis, urine culture, chest x-ray, repeat lab work tomorrow.  Hyponatremia. IV fluids adjusted to normal saline at 75 cc per hour. Check labs tomorrow  Atelectasis. Encouraged incentive spirometry and ambulation. Review chest x-ray.  Tobacco user.   @PROBHOSP @  LOS: 2 days    Amer Alcindor M. Derrell Lolling,  M.D., [facility] General and Minimally invasive Surgery Breast and Colorectal Surgery Office:   [phone] Pager:   [phone]  03/01/2013  . .prob

## 2013-03-02 LAB — BASIC METABOLIC PANEL
BUN: 11 mg/dL (ref 6–23)
Calcium: 8.5 mg/dL (ref 8.4–10.5)
Creatinine, Ser: 0.82 mg/dL (ref 0.50–1.35)
GFR calc Af Amer: >90 mL/min (ref >90)
GFR calc non Af Amer: >90 mL/min (ref >90)
Glucose, Bld: 87 mg/dL (ref 70–99)

## 2013-03-02 LAB — GLUCOSE, CAPILLARY
Glucose-Capillary: 91 mg/dL (ref 70–99)
Glucose-Capillary: 92 mg/dL (ref 70–99)
Glucose-Capillary: 99 mg/dL (ref 70–99)

## 2013-03-02 LAB — CBC
HCT: 37.5 % — ABNORMAL LOW (ref 39.0–52.0)
Hemoglobin: 12.3 g/dL — ABNORMAL LOW (ref 13.0–17.0)
MCH: 30.2 pg (ref 26.0–34.0)
MCHC: 32.8 g/dL (ref 30.0–36.0)
MCV: 92.1 fL (ref 78.0–100.0)
RDW: 13.9 % (ref 11.5–15.5)

## 2013-03-02 MED ORDER — MILK AND MOLASSES ENEMA
Freq: Once | RECTAL | Status: AC
  Administered 2013-03-02: 11:00:00 via RECTAL
  Filled 2013-03-02: qty 250

## 2013-03-02 MED ORDER — GUAIFENESIN 100 MG/5ML PO SYRP
200.0000 mg | ORAL_SOLUTION | Freq: Three times a day (TID) | ORAL | Status: DC
  Administered 2013-03-02 – 2013-03-03 (×2): 200 mg via ORAL
  Filled 2013-03-02 (×14): qty 10

## 2013-03-02 NOTE — Progress Notes (Signed)
3 Days Post-Op  Subjective: Passing some flatus but no stool. Anorexic. He requests another enema.  Denies shortness of breath. Chest x-ray shows right lower lobe atelectasis. No significant sputum production. No fever, or tachycardia.  Objective: Vital signs in last 24 hours: Temp:  [97.6 F (36.4 C)-99.7 F (37.6 C)] 98.9 F (37.2 C) (09/14 0416) Pulse Rate:  [81-97] 81 (09/14 0416) Resp:  [15-24] 19 (09/14 0749) BP: (124-147)/(68-95) 138/84 mmHg (09/14 0416) SpO2:  [89 %-99 %] 93 % (09/14 0749) Last BM Date: 02/26/13 (pre op)  Intake/Output from previous day: 09/13 0701 - 09/14 0700 In: 530 [P.O.:480] Out: 500 [Urine:400; Drains:100] Intake/Output this shift:    General appearance: alert. Sitting up. Minimal distress. Friendly and cooperative. Resp:  decreased breath sounds with egophony right base. GI: soft. Wounds clean. Borderline  distended. Bowel sounds present.  Lab Results:  Results for orders placed during the hospital encounter of 02/27/13 (from the past 24 hour(s))  URINALYSIS, ROUTINE W REFLEX MICROSCOPIC     Status: None   Collection Time    03/01/13 11:21 AM      Result Value Range   Color, Urine YELLOW  YELLOW   APPearance CLEAR  CLEAR   Specific Gravity, Urine 1.017  1.005 - 1.030   pH 6.0  5.0 - 8.0   Glucose, UA NEGATIVE  NEGATIVE mg/dL   Hgb urine dipstick NEGATIVE  NEGATIVE   Bilirubin Urine NEGATIVE  NEGATIVE   Ketones, ur NEGATIVE  NEGATIVE mg/dL   Protein, ur NEGATIVE  NEGATIVE mg/dL   Urobilinogen, UA 0.2  0.0 - 1.0 mg/dL   Nitrite NEGATIVE  NEGATIVE   Leukocytes, UA NEGATIVE  NEGATIVE  GLUCOSE, CAPILLARY     Status: Abnormal   Collection Time    03/01/13 11:56 AM      Result Value Range   Glucose-Capillary 117 (*) 70 - 99 mg/dL   Comment 1 Notify RN    GLUCOSE, CAPILLARY     Status: None   Collection Time    03/01/13  5:21 PM      Result Value Range   Glucose-Capillary 84  70 - 99 mg/dL   Comment 1 Notify RN    GLUCOSE, CAPILLARY      Status: None   Collection Time    03/01/13  8:08 PM      Result Value Range   Glucose-Capillary 93  70 - 99 mg/dL  GLUCOSE, CAPILLARY     Status: None   Collection Time    03/02/13 12:40 AM      Result Value Range   Glucose-Capillary 99  70 - 99 mg/dL   Comment 1 Documented in Chart     Comment 2 Notify RN    GLUCOSE, CAPILLARY     Status: None   Collection Time    03/02/13  4:13 AM      Result Value Range   Glucose-Capillary 92  70 - 99 mg/dL   Comment 1 Documented in Chart     Comment 2 Notify RN    CBC     Status: Abnormal   Collection Time    03/02/13  5:32 AM      Result Value Range   WBC 16.2 (*) 4.0 - 10.5 K/uL   RBC 4.07 (*) 4.22 - 5.81 MIL/uL   Hemoglobin 12.3 (*) 13.0 - 17.0 g/dL   HCT 21.3 (*) 08.6 - 57.8 %   MCV 92.1  78.0 - 100.0 fL   MCH 30.2  26.0 -  34.0 pg   MCHC 32.8  30.0 - 36.0 g/dL   RDW 09.8  11.9 - 14.7 %   Platelets 205  150 - 400 K/uL  BASIC METABOLIC PANEL     Status: Abnormal   Collection Time    03/02/13  5:32 AM      Result Value Range   Sodium 132 (*) 135 - 145 mEq/L   Potassium 4.5  3.5 - 5.1 mEq/L   Chloride 96  96 - 112 mEq/L   CO2 26  19 - 32 mEq/L   Glucose, Bld 87  70 - 99 mg/dL   BUN 11  6 - 23 mg/dL   Creatinine, Ser 8.29  0.50 - 1.35 mg/dL   Calcium 8.5  8.4 - 56.2 mg/dL   GFR calc non Af Amer >90  >90 mL/min   GFR calc Af Amer >90  >90 mL/min     Studies/Results: @RISRSLT24 @  . guaifenesin  200 mg Oral TID PC  . heparin  5,000 Units Subcutaneous Q8H  . insulin aspart  0-15 Units Subcutaneous Q4H  . milk and molasses   Rectal Once  . morphine   Intravenous Q4H  . pantoprazole (PROTONIX) IV  40 mg Intravenous QHS     Assessment/Plan: s/p Procedure(s): LAPAROSCOPIC PANCREATECTOMY LAPAROSCOPIC SPLENECTOMY  POD #. Stable.  Ileus beginning to resolve.  Slow  Allow limited clear liquids.  Another enema.  Pathology not completed.    leukocytosis. Probably Post op splenectomy. Doubt pneumonia.  UA clear.    Hyponatremia. Resolving. IV fluids adjusted to normal saline at 75 cc per hour.   Atelectasis. Encouraged incentive spirometry and ambulation. Add expectorant, flutter valve, ambulate more.  Tobacco user.   @PROBHOSP @  LOS: 3 days    Jon Pena 03/02/2013  . .prob

## 2013-03-03 LAB — BASIC METABOLIC PANEL
BUN: 12 mg/dL (ref 6–23)
Calcium: 9 mg/dL (ref 8.4–10.5)
Creatinine, Ser: 0.74 mg/dL (ref 0.50–1.35)
GFR calc Af Amer: >90 mL/min (ref >90)
GFR calc non Af Amer: >90 mL/min (ref >90)
Glucose, Bld: 106 mg/dL — ABNORMAL HIGH (ref 70–99)
Potassium: 4.1 mEq/L (ref 3.5–5.1)

## 2013-03-03 LAB — CBC
HCT: 38.1 % — ABNORMAL LOW (ref 39.0–52.0)
Hemoglobin: 12.5 g/dL — ABNORMAL LOW (ref 13.0–17.0)
MCH: 29.7 pg (ref 26.0–34.0)
MCHC: 32.8 g/dL (ref 30.0–36.0)
MCV: 90.5 fL (ref 78.0–100.0)
RDW: 13.7 % (ref 11.5–15.5)

## 2013-03-03 LAB — GLUCOSE, CAPILLARY
Glucose-Capillary: 97 mg/dL (ref 70–99)
Glucose-Capillary: 115 mg/dL — ABNORMAL HIGH (ref 70–99)
Glucose-Capillary: 144 mg/dL — ABNORMAL HIGH (ref 70–99)

## 2013-03-03 LAB — URINE CULTURE: Colony Count: NO GROWTH

## 2013-03-03 LAB — LIPASE, BLOOD: Lipase: 22 U/L (ref 11–59)

## 2013-03-03 MED ORDER — PANTOPRAZOLE SODIUM 40 MG PO TBEC
40.0000 mg | DELAYED_RELEASE_TABLET | Freq: Every day | ORAL | Status: DC
  Administered 2013-03-03 – 2013-03-05 (×3): 40 mg via ORAL
  Filled 2013-03-03 (×2): qty 1

## 2013-03-03 MED ORDER — OXYCODONE-ACETAMINOPHEN 5-325 MG PO TABS
1.0000 | ORAL_TABLET | ORAL | Status: DC | PRN
  Administered 2013-03-03 – 2013-03-05 (×9): 2 via ORAL
  Filled 2013-03-03 (×9): qty 2

## 2013-03-03 NOTE — Progress Notes (Signed)
Patient ID: Jon Pena, male   DOB: [birth year], 62 y.o.   MRN: [id number] 4 Days Post-Op  Subjective: + Flatus, no sig stool yet.    No SOB, no nausea.    Objective: Vital signs in last 24 hours: Temp:  [98 F (36.7 C)-99 F (37.2 C)] 98 F (36.7 C) (09/15 1045) Pulse Rate:  [76-90] 77 (09/15 1045) Resp:  [11-18] 18 (09/15 1045) BP: (133-159)/(76-85) 141/81 mmHg (09/15 1045) SpO2:  [96 %-98 %] 97 % (09/15 1045) FiO2 (%):  [28 %] 28 % (09/14 1300) Last BM Date: 03/02/13  Intake/Output from previous day: 09/14 0701 - 09/15 0700 In: 840 [P.O.:840] Out: 210 [Drains:210] Intake/Output this shift: Total I/O In: 360 [P.O.:360] Out: 30 [Drains:30]  General appearance: alert. Sitting up. Minimal distress. Friendly and cooperative. Resp:  Breathing comfortably.   GI: soft. Wounds clean. Mildly distended.  Lab Results:  Results for orders placed during the hospital encounter of 02/27/13 (from the past 24 hour(s))  GLUCOSE, CAPILLARY     Status: None   Collection Time    03/02/13 12:03 PM      Result Value Range   Glucose-Capillary 91  70 - 99 mg/dL  GLUCOSE, CAPILLARY     Status: None   Collection Time    03/02/13  5:24 PM      Result Value Range   Glucose-Capillary 97  70 - 99 mg/dL   Comment 1 Notify RN    GLUCOSE, CAPILLARY     Status: Abnormal   Collection Time    03/02/13  7:48 PM      Result Value Range   Glucose-Capillary 115 (*) 70 - 99 mg/dL  GLUCOSE, CAPILLARY     Status: Abnormal   Collection Time    03/02/13 11:58 PM      Result Value Range   Glucose-Capillary 117 (*) 70 - 99 mg/dL  GLUCOSE, CAPILLARY     Status: Abnormal   Collection Time    03/03/13  3:45 AM      Result Value Range   Glucose-Capillary 103 (*) 70 - 99 mg/dL  CBC     Status: Abnormal   Collection Time    03/03/13  7:50 AM      Result Value Range   WBC 13.6 (*) 4.0 - 10.5 K/uL   RBC 4.21 (*) 4.22 - 5.81 MIL/uL   Hemoglobin 12.5 (*) 13.0 - 17.0 g/dL   HCT 40.9 (*) 81.1 - 91.4 %   MCV 90.5  78.0 - 100.0 fL   MCH 29.7  26.0 - 34.0 pg   MCHC 32.8  30.0 - 36.0 g/dL   RDW 78.2  95.6 - 21.3 %   Platelets 270  150 - 400 K/uL  BASIC METABOLIC PANEL     Status: Abnormal   Collection Time    03/03/13  7:50 AM      Result Value Range   Sodium 135  135 - 145 mEq/L   Potassium 4.1  3.5 - 5.1 mEq/L   Chloride 98  96 - 112 mEq/L   CO2 28  19 - 32 mEq/L   Glucose, Bld 106 (*) 70 - 99 mg/dL   BUN 12  6 - 23 mg/dL   Creatinine, Ser 0.86  0.50 - 1.35 mg/dL   Calcium 9.0  8.4 - 57.8 mg/dL   GFR calc non Af Amer >90  >90 mL/min   GFR calc Af Amer >90  >90 mL/min  LIPASE, BLOOD     Status:  None   Collection Time    03/03/13  7:50 AM      Result Value Range   Lipase 22  11 - 59 U/L  GLUCOSE, CAPILLARY     Status: None   Collection Time    03/03/13  8:01 AM      Result Value Range   Glucose-Capillary 89  70 - 99 mg/dL   Comment 1 Notify RN       Studies/Results:   . guaifenesin  200 mg Oral TID PC  . heparin  5,000 Units Subcutaneous Q8H  . insulin aspart  0-15 Units Subcutaneous Q4H  . pantoprazole  40 mg Oral Daily     Assessment/Plan: s/p Procedure(s): LAPAROSCOPIC PANCREATECTOMY LAPAROSCOPIC SPLENECTOMY  POD #4. Stable.  Ileus SLOWLY beginning to resolve.  Advance to full liquids.     leukocytosis. Probably Post op splenectomy. Doubt pneumonia.  UA clear.   Hyponatremia. Resolving. IV fluids adjusted to normal saline at 75 cc per hour.   Atelectasis. Encouraged incentive spirometry and ambulation. Add expectorant, flutter valve, ambulate more.  Tobacco user.     LOS: 4 days    [facility] 03/03/2013  . .prob

## 2013-03-04 LAB — CBC
MCV: 89.1 fL (ref 78.0–100.0)
Platelets: 324 10*3/uL (ref 150–400)
RBC: 4.21 MIL/uL — ABNORMAL LOW (ref 4.22–5.81)
WBC: 12.1 10*3/uL — ABNORMAL HIGH (ref 4.0–10.5)

## 2013-03-04 LAB — GLUCOSE, CAPILLARY
Glucose-Capillary: 130 mg/dL — ABNORMAL HIGH (ref 70–99)
Glucose-Capillary: 103 mg/dL — ABNORMAL HIGH (ref 70–99)
Glucose-Capillary: 135 mg/dL — ABNORMAL HIGH (ref 70–99)
Glucose-Capillary: 132 mg/dL — ABNORMAL HIGH (ref 70–99)

## 2013-03-04 LAB — BASIC METABOLIC PANEL
CO2: 29 mEq/L (ref 19–32)
Chloride: 96 mEq/L (ref 96–112)
Creatinine, Ser: 0.82 mg/dL (ref 0.50–1.35)
Potassium: 3.7 mEq/L (ref 3.5–5.1)

## 2013-03-04 MED ORDER — MAGIC MOUTHWASH
5.0000 mL | Freq: Four times a day (QID) | ORAL | Status: DC
  Administered 2013-03-04 – 2013-03-05 (×3): 5 mL via ORAL
  Filled 2013-03-04 (×7): qty 5

## 2013-03-04 MED ORDER — FLEET ENEMA 7-19 GM/118ML RE ENEM
1.0000 | ENEMA | Freq: Once | RECTAL | Status: AC
  Administered 2013-03-04: 11:00:00 via RECTAL
  Filled 2013-03-04: qty 1

## 2013-03-04 NOTE — Progress Notes (Signed)
Patient ID: Jon Pena, male   DOB: 1951/02/09, 62 y.o.   MRN: [id number] 5 Days Post-Op  Subjective: + Flatus, no sig stool yet.    No SOB, no nausea.  Tolerated full liquids.    Objective: Vital signs in last 24 hours: Temp:  [98 F (36.7 C)-99.3 F (37.4 C)] 98.8 F (37.1 C) (09/16 0500) Pulse Rate:  [60-77] 61 (09/16 0500) Resp:  [18-19] 18 (09/16 0500) BP: (134-156)/(81-91) 156/91 mmHg (09/16 0500) SpO2:  [96 %-100 %] 96 % (09/16 0500) Last BM Date: 03/02/13  Intake/Output from previous day: 09/15 0701 - 09/16 0700 In: 960 [P.O.:960] Out: 70 [Drains:70] Intake/Output this shift:    General appearance: alert. Sitting up. No distress. Resp:  Breathing comfortably.   GI: soft. Wounds clean. Mildly distended.  No erythema.  Lab Results:  Results for orders placed during the hospital encounter of 02/27/13 (from the past 24 hour(s))  GLUCOSE, CAPILLARY     Status: Abnormal   Collection Time    03/03/13 11:03 AM      Result Value Range   Glucose-Capillary 141 (*) 70 - 99 mg/dL   Comment 1 Notify RN    GLUCOSE, CAPILLARY     Status: Abnormal   Collection Time    03/03/13  4:02 PM      Result Value Range   Glucose-Capillary 144 (*) 70 - 99 mg/dL   Comment 1 Notify RN    GLUCOSE, CAPILLARY     Status: Abnormal   Collection Time    03/03/13  7:33 PM      Result Value Range   Glucose-Capillary 157 (*) 70 - 99 mg/dL  GLUCOSE, CAPILLARY     Status: Abnormal   Collection Time    03/04/13 12:06 AM      Result Value Range   Glucose-Capillary 118 (*) 70 - 99 mg/dL  GLUCOSE, CAPILLARY     Status: Abnormal   Collection Time    03/04/13  4:11 AM      Result Value Range   Glucose-Capillary 130 (*) 70 - 99 mg/dL  GLUCOSE, CAPILLARY     Status: Abnormal   Collection Time    03/04/13  7:47 AM      Result Value Range   Glucose-Capillary 123 (*) 70 - 99 mg/dL   Comment 1 Notify RN       Studies/Results:   . guaifenesin  200 mg Oral TID PC  . heparin  5,000 Units  Subcutaneous Q8H  . insulin aspart  0-15 Units Subcutaneous Q4H  . pantoprazole  40 mg Oral Daily     Assessment/Plan: s/p Procedure(s): LAPAROSCOPIC PANCREATECTOMY LAPAROSCOPIC SPLENECTOMY  POD #5. Stable.  Advance to low fat diet.  Marland Kitchen     leukocytosis. Improving.  Recheck.   Hyponatremia. Saline lock  Atelectasis. Encouraged incentive spirometry and ambulation. Add expectorant, flutter valve, ambulate more.  Tobacco user.   Hopefully home tomorrow.    LOS: 5 days    [facility] 03/04/2013  . .prob

## 2013-03-05 ENCOUNTER — Telehealth (INDEPENDENT_AMBULATORY_CARE_PROVIDER_SITE_OTHER): Payer: Self-pay | Admitting: *Deleted

## 2013-03-05 ENCOUNTER — Other Ambulatory Visit (INDEPENDENT_AMBULATORY_CARE_PROVIDER_SITE_OTHER): Payer: Self-pay | Admitting: *Deleted

## 2013-03-05 MED ORDER — BLOOD GLUCOSE TEST VI STRP: 1.0000 | ORAL_STRIP | Freq: Two times a day (BID) | Status: DC

## 2013-03-05 MED ORDER — OXYCODONE-ACETAMINOPHEN 5-325 MG PO TABS: 1.0000 | ORAL_TABLET | ORAL | Status: DC | PRN

## 2013-03-05 MED ORDER — ACCU-CHEK MULTICLIX LANCETS MISC: Status: DC

## 2013-03-05 MED ORDER — BLOOD GLUCOSE METER KIT: PACK | Status: DC

## 2013-03-05 NOTE — Discharge Summary (Addendum)
Physician Discharge Summary  Patient ID: Jon Pena MRN: [id number] DOB/AGE: Dec 11, 1950 61 y.o.  Admit date: 02/27/2013 Discharge date: 03/05/2013  Admission Diagnoses: Pancreatic cancer Tobacco abuse  Discharge Diagnoses:  Pancreatic cancer Hyperglycemia Tobacco abuse  Discharged Condition: stable  Hospital Course:  Pt is a 62 yo M who underwent hand assisted lap distal pancreatectomy and splenectomy for pancreatic cancer.  He did well overall.  He had a bit of ileus initially.  His drain output was also relatively high initially.  These both resolved.  He initially required PCA, but was able to transition to oral pain medications.  He require enemas to have bowel movement.  He was able to void spontaneously and ambulate independently.  His drain was removed prior to d/c.  He was discharged to home in stable condition.  He was not discharged on insulin since he was not requiring much on a sliding scale.  He was instructed to take blood sugars BID and follow up with PCP.  He was advised to call if they were consistently above 150.  Consults: oncology  Significant Diagnostic Studies: labs: wBCs down to 12, normal electrolytes prior to d/c.  Blood sugars mostly between 100 and 120, some 150-160.    Treatments: surgery: see above.    Discharge Exam: Blood pressure 146/76, pulse 65, temperature 98.7 F (37.1 C), temperature source Oral, resp. rate 18, height 5\' 11"  (1.803 m), weight 215 lb 6.4 oz (97.705 kg), SpO2 93.00%. General appearance: alert, cooperative and no distress Resp: breathing comfortably GI: soft, approp tender, non distended.    Disposition: 01-Home or Self Care  Discharge Orders   Future Orders Complete By Expires   Call MD for:  persistant nausea and vomiting  As directed    Call MD for:  redness, tenderness, or signs of infection (pain, swelling, redness, odor or green/yellow discharge around incision site)  As directed    Call MD for:  severe uncontrolled  pain  As directed    Call MD for:  temperature >100.4  As directed    Diet - low sodium heart healthy  As directed    Discharge instructions  As directed    Comments:     Take blood sugars 2 x per day.   Discharge wound care:  As directed    Comments:     Can remove all bandages in 48 hours and leave open to air.   Increase activity slowly  As directed    Remove dressing in 48 hours  As directed        Medication List         accu-chek multiclix lancets  Use as instructed     acyclovir 800 MG tablet  Commonly known as:  ZOVIRAX  Take 800 mg by mouth 4 (four) times daily as needed (for outbreaks).     Blood Glucose Meter kit  Use as instructed     BLOOD GLUCOSE TEST STRIPS Strp  1 strip by Subdermal route 2 (two) times daily.     famotidine 20 MG tablet  Commonly known as:  PEPCID  Take 20 mg by mouth as needed for heartburn.     oxyCODONE-acetaminophen 5-325 MG per tablet  Commonly known as:  PERCOCET/ROXICET  Take 1-2 tablets by mouth every 4 (four) hours as needed.     SYSTANE 0.4-0.3 % Soln  Generic drug:  Polyethyl Glycol-Propyl Glycol  Place 1 drop into both eyes daily as needed (for dry eyes).  Follow-up Information   Follow up with [facility], MD. Schedule an appointment as soon as possible for a visit in 2 weeks.   Specialty:  General Surgery   Contact information:   [address] [place] [address] [phone]       Follow up with Kirk Ruths, MD. (approx 2 weeks to discuss blood sugars)    Specialty:  Family Medicine   Contact information:   [address] [place] [address] [phone]       Signed: Almond Lint 03/05/2013, 8:43 AM

## 2013-03-05 NOTE — Progress Notes (Signed)
Discharge patient. Home discharge instruction given, no question verbalized. 

## 2013-03-05 NOTE — Telephone Encounter (Signed)
Patient called to report [facility] told him that they have not received any prescriptions for him.  Explained to patient that we have confirmation from 03/05/13 @ 843a on all 3 prescriptions.  [facility] called [facility] who states that they received 1 of the 3 prescriptions.  Asked them why we got a confirmation if they aren't showing them however the lady was unable to explain this.  The 2 prescriptions that they did not received have been escribed [facility] on [address].  Patient updated and will check with [facility] shortly.

## 2013-03-06 ENCOUNTER — Other Ambulatory Visit: Payer: Self-pay | Admitting: *Deleted

## 2013-03-06 DIAGNOSIS — K8689 Other specified diseases of pancreas: Secondary | ICD-10-CM

## 2013-03-06 NOTE — Progress Notes (Signed)
Spoke with patient by phone and confirmed appointments with Dr. Truett Perna and Dr. Mitzi Hansen for 03/13/13.  Contact names and phone numbers were provided.  Patient requested seeing the dietician.  Referral was made to [facility].

## 2013-03-11 ENCOUNTER — Telehealth: Payer: Self-pay | Admitting: Oncology

## 2013-03-11 ENCOUNTER — Encounter: Payer: Self-pay | Admitting: Radiation Oncology

## 2013-03-11 NOTE — Progress Notes (Signed)
GI Location of Tumor / Histology: Pancreas  Patient presented found incidentally to have a distal pancreatic mass, had recent appendicitis and was found on CT 4.5cm mass  Biopsies of 9 /11/14: DiagnosisPancreas, resection, Distal and spleen- INVASIVE ADENOCARCINOMA, WELL DIFFERENTIATED, SPANNING 3.7 CM.- EXTRAPANCREATIC EXTENSION IS IDENTIFIED.- LYMPHOVASCULAR INVASION IS IDENTIFIED.- METASTATIC CARCINOMA IN 2 OF 5 LYMPH NODES (2/5).- BENIGN SPLEEN.   Past/Anticipated interventions by surgeon, if any:02/27/13: PROCEDURE: Diagnostic laparoscopy, laparoscopic hand-assisted distal pancreatectomy and splenectomy. SURGEON: Almond Lint, MD ASSISTANT: Avel Peace, M.D.  FINDINGS: Mass adherent to splenic hilum.  SPECIMEN: Distal pancreas and spleen to Pathology. D/C home 03/05/13, instructed to check his blood sugars bid and to f/u with  His primary MD,, remove all bandages in 48 hours,leave open to air,  f/u 03/26/13 2 pm Dr.Byerly   Past/Anticipated interventions by medical oncology, if any: 1st  appt 03/13/13 2 pm with Dr.Sherrill,   Weight changes, if any: 14 lbs post op surgery  Bowel/Bladder complaints, if any:   Nausea / Vomiting, if any:no  Pain issues, if any: slight abdominal pain prn SAFETY ISSUES:  Prior radiation? no  Pacemaker/ICD no  Is the patient on methotrexate? no  Current Complaints / other details: Divorced, no children,   Paternal  Aunt breast cancer,  Deceased in her 25's, father pancreatitis,deceased age 48,  smoker, alcohol 1 drink q 2 weeks,

## 2013-03-11 NOTE — Telephone Encounter (Signed)
, °

## 2013-03-13 ENCOUNTER — Ambulatory Visit (HOSPITAL_BASED_OUTPATIENT_CLINIC_OR_DEPARTMENT_OTHER): Payer: BC Managed Care – PPO | Admitting: Oncology

## 2013-03-13 ENCOUNTER — Telehealth: Payer: Self-pay | Admitting: Oncology

## 2013-03-13 ENCOUNTER — Encounter: Payer: Self-pay | Admitting: Radiation Oncology

## 2013-03-13 ENCOUNTER — Ambulatory Visit
Admit: 2013-03-13 | Discharge: 2013-03-13 | Disposition: A | Discharge status: Home / Self Care | Payer: BC Managed Care – PPO | Attending: Radiation Oncology | Admitting: Radiation Oncology

## 2013-03-13 ENCOUNTER — Encounter: Payer: Self-pay | Admitting: Oncology

## 2013-03-13 ENCOUNTER — Other Ambulatory Visit (INDEPENDENT_AMBULATORY_CARE_PROVIDER_SITE_OTHER): Payer: Self-pay | Admitting: General Surgery

## 2013-03-13 ENCOUNTER — Other Ambulatory Visit: Payer: Self-pay | Admitting: *Deleted

## 2013-03-13 ENCOUNTER — Ambulatory Visit: Payer: BC Managed Care – PPO

## 2013-03-13 VITALS — BP 127/76 | HR 80 | Temp 97.8°F | Resp 18 | Ht 71.0 in | Wt 201.7 lb

## 2013-03-13 VITALS — BP 128/63 | HR 63 | Temp 97.6°F | Resp 20 | Ht 71.0 in | Wt 201.5 lb

## 2013-03-13 DIAGNOSIS — Z87891 Personal history of nicotine dependence: Secondary | ICD-10-CM | POA: Insufficient documentation

## 2013-03-13 DIAGNOSIS — K8689 Other specified diseases of pancreas: Secondary | ICD-10-CM

## 2013-03-13 DIAGNOSIS — K219 Gastro-esophageal reflux disease without esophagitis: Secondary | ICD-10-CM | POA: Insufficient documentation

## 2013-03-13 DIAGNOSIS — C772 Secondary and unspecified malignant neoplasm of intra-abdominal lymph nodes: Secondary | ICD-10-CM | POA: Insufficient documentation

## 2013-03-13 DIAGNOSIS — Z90411 Acquired partial absence of pancreas: Secondary | ICD-10-CM | POA: Insufficient documentation

## 2013-03-13 DIAGNOSIS — N289 Disorder of kidney and ureter, unspecified: Secondary | ICD-10-CM

## 2013-03-13 DIAGNOSIS — C259 Malignant neoplasm of pancreas, unspecified: Secondary | ICD-10-CM | POA: Insufficient documentation

## 2013-03-13 DIAGNOSIS — C257 Malignant neoplasm of other parts of pancreas: Secondary | ICD-10-CM | POA: Insufficient documentation

## 2013-03-13 DIAGNOSIS — C801 Malignant (primary) neoplasm, unspecified: Secondary | ICD-10-CM

## 2013-03-13 DIAGNOSIS — N2889 Other specified disorders of kidney and ureter: Secondary | ICD-10-CM

## 2013-03-13 DIAGNOSIS — Z9089 Acquired absence of other organs: Secondary | ICD-10-CM | POA: Insufficient documentation
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
## 2013-03-13 NOTE — Progress Notes (Signed)
Please see the Nurse Progress Note in the MD Initial Consult Encounter for this patient. 

## 2013-03-13 NOTE — Progress Notes (Signed)
Reports checking his blood glucose twice daily-highest runs 140/lowest 99 with average 113-119. Reports approximate 15 lb weight loss in past year. Has not smoked cigarette in 15 days.

## 2013-03-13 NOTE — Progress Notes (Signed)
Checked in new pt with no financial concerns. °

## 2013-03-13 NOTE — Progress Notes (Signed)
Met with Jon Pena and family. Explained role of nurse navigator. Educational information provided on pancreatic cancer along with [facility] resource sheet and phone numbers.  Referral made to dietician for diet education.  No barriers to care identified.  Will continue to follow as needed.

## 2013-03-13 NOTE — Telephone Encounter (Signed)
Gave pt appt for lab and Md for october , emailed [place] regarding chemo, called Dr. Donell Beers and left message regarding portacath, will call Dr. Jennette Dubin tomorrow for Urology consult

## 2013-03-14 ENCOUNTER — Telehealth: Payer: Self-pay | Admitting: *Deleted

## 2013-03-14 ENCOUNTER — Telehealth: Payer: Self-pay | Admitting: Oncology

## 2013-03-14 DIAGNOSIS — C252 Malignant neoplasm of tail of pancreas: Secondary | ICD-10-CM | POA: Insufficient documentation

## 2013-03-14 NOTE — Telephone Encounter (Signed)
Per staff message and POF I have scheduled appts.  JMW  

## 2013-03-14 NOTE — Telephone Encounter (Signed)
FAXED PT MEDICAL RECORDS TO DR Patsi Sears

## 2013-03-14 NOTE — Telephone Encounter (Signed)
Gave pt appt for Dr. Patsi Sears Urology, for 10/28 11:15 am gave referral to HIM

## 2013-03-14 NOTE — Telephone Encounter (Signed)
Talked to pt and gave him appt for lab, Md and chemo for October 2014

## 2013-03-14 NOTE — Progress Notes (Signed)
Radiation Oncology         [phone] ________________________________  Name: ROMYN BOSWELL MRN: [id number]  Date: 03/13/2013  DOB: March 19, 1951  NW:GNFAOZH,YQMVHQI M, MD  Ladene Artist, MD   Almond Lint, M.D.  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  HISTORY OF PRESENT ILLNESS::Jon Pena is a 62 y.o. male who is seen for an initial consultation visit. The patient describes a history of acute abdominal pain and was found to have appendicitis. The patient underwent an appendectomy but during this workup a CT scan revealed a pancreatic tail mass which prompted further workup.  Imaging included an MRI scan which was subsequently completed which revealed 4.8 cm mass within the tail of the pancreas. No definite evidence of vascular invasion was seen at that time and no peri- pancreatic lymphadenopathy was present. An endoscopic ultrasound was completed on 02/20/2013 which showed an irregular mass within the tail of the pancreas. This did not involve the major vessels. A fine needle aspiration was obtained and cytology returned positive for findings consistent with well differentiated adenocarcinoma.   The patient underwent resection on 02/27/2013. The operative procedure consisted of a laparoscopic hand-assisted distal pancreatectomy/splenectomy which revealed a tumor adherent to the splenic hilum. Pathology revealed a well-differentiated adenocarcinoma measuring 3.7 cm. Extra pancreatic extension was identified and lymphovascular invasion was identified. 2/5 lymph nodes demonstrated carcinoma. The pathologic stage was T3 N1.  Tumor markers have been completed and the CA 19-9 level on 01/26/2013 was 4.8.  Postoperatively, the patient has done well overall.   PREVIOUS RADIATION THERAPY: No   PAST MEDICAL HISTORY:  has a past medical history of GERD (gastroesophageal reflux disease); Cough; Shortness of breath;  and Cancer (02/27/13).     PAST SURGICAL HISTORY: Past Surgical History  Procedure Laterality Date  . Laparoscopic appendectomy N/A 01/26/2013    Procedure: APPENDECTOMY LAPAROSCOPIC;  Surgeon: Liz Malady, MD;  Location: [facility] OR;  Service: General;  Laterality: N/A;  . Appendectomy    . Eus N/A 02/20/2013    Procedure: UPPER ENDOSCOPIC ULTRASOUND (EUS) LINEAR;  Surgeon: Rachael Fee, MD;  Location: [facility] ENDOSCOPY;  Service: Endoscopy;  Laterality: N/A;  . Pancreatectomy N/A 02/27/2013    Procedure: LAPAROSCOPIC PANCREATECTOMY;  Surgeon: Almond Lint, MD;  Location: [facility] OR;  Service: General;  Laterality: N/A;  . Laparoscopic splenectomy N/A 02/27/2013    Procedure: LAPAROSCOPIC SPLENECTOMY;  Surgeon: Almond Lint, MD;  Location: [facility] OR;  Service: General;  Laterality: N/A;     FAMILY HISTORY: family history includes Cancer in his paternal aunt.   SOCIAL HISTORY:  reports that he quit smoking about 4 weeks ago. His smoking use included Cigarettes. He has a 20 pack-year smoking history. He has never used smokeless tobacco. He reports that  drinks alcohol. He reports that he does not use illicit drugs.   ALLERGIES: Review of patient's allergies indicates no known allergies.   MEDICATIONS:  Current Outpatient Prescriptions  Medication Sig Dispense Refill  . acyclovir (ZOVIRAX) 800 MG tablet Take 800 mg by mouth 4 (four) times daily as needed (for outbreaks).      . famotidine (PEPCID) 20 MG tablet Take 20 mg by mouth as needed for heartburn.      . Glucose Blood (BLOOD GLUCOSE TEST STRIPS) STRP 1 strip by Subdermal route 2 (two) times daily.  60 each  0  . Lancets (ACCU-CHEK MULTICLIX) lancets Use as instructed  100 each  0  .  naproxen sodium (ANAPROX) 220 MG tablet Take 220 mg by mouth as needed. Takes 2 prn when he plays golf      . oxyCODONE-acetaminophen (PERCOCET/ROXICET) 5-325 MG per tablet Take 1-2 tablets by mouth every 4 (four) hours as needed.  30 tablet  0  . Polyethyl  Glycol-Propyl Glycol (SYSTANE) 0.4-0.3 % SOLN Place 1 drop into both eyes daily as needed (for dry eyes).      . Blood Glucose Monitoring Suppl (BLOOD GLUCOSE METER) kit Use as directed  1 each  0   No current facility-administered medications for this encounter.     REVIEW OF SYSTEMS:  A 15 point review of systems is documented in the electronic medical record. This was obtained by the nursing staff. However, I reviewed this with the patient to discuss relevant findings and make appropriate changes.  Pertinent items are noted in HPI.    PHYSICAL EXAM:  height is 5\' 11"  (1.803 m) and weight is 201 lb 8 oz (91.4 kg). His oral temperature is 97.6 F (36.4 C). His blood pressure is 128/63 and his pulse is 63. His respiration is 20.   General: Well-developed, in no acute distress HEENT: Normocephalic, atraumatic; oral cavity clear Neck: Supple without any lymphadenopathy Cardiovascular: Regular rate and rhythm Respiratory: Clear to auscultation bilaterally GI: Soft, nontender, normal bowel sounds Extremities: No edema present Neuro: No focal deficits     LABORATORY DATA:  Lab Results  Component Value Date   WBC 12.1* 03/04/2013   HGB 12.8* 03/04/2013   HCT 37.5* 03/04/2013   MCV 89.1 03/04/2013   PLT 324 03/04/2013   Lab Results  Component Value Date   NA 136 03/04/2013   K 3.7 03/04/2013   CL 96 03/04/2013   CO2 29 03/04/2013   Lab Results  Component Value Date   ALT 22 02/25/2013   AST 17 02/25/2013   ALKPHOS 72 02/25/2013   BILITOT 0.1* 02/25/2013      RADIOGRAPHY: Dg Chest 2 View  03/01/2013   *RADIOLOGY REPORT*  Clinical Data: Leukocytosis, decreased breath sounds.  CHEST - 2 VIEW  Comparison: February 25, 2013.  Findings: Cardiomediastinal silhouette appears normal.  There is interval development of linear opacity in the right lung base consistent with subsegmental atelectasis or pneumonia.  Left lung is clear.  No pneumothorax is noted.  IMPRESSION: Interval development of right  basilar opacity consistent with subsegmental atelectasis or pneumonia.   Original Report Authenticated By: Lupita Raider.,  M.D.   Dg Chest 2 View  02/25/2013   *RADIOLOGY REPORT*  Clinical Data: Preoperative evaluation for laparoscopic pancreatectomy and splenectomy, cough, shortness of breath, GERD, former smoker  CHEST - 2 VIEW  Comparison: 11/30/2009  Findings: Normal heart size, mediastinal contours, and pulmonary vascularity. Lungs clear. No pleural effusion or pneumothorax. Bones unremarkable. Probable bilateral nipple shadows, corresponding to nipple markers on prior exam.  IMPRESSION: No acute abnormalities.   Original Report Authenticated By: Ulyses Southward, M.D.   Mr Abd W/wo Cm/mrcp  02/20/2013   *RADIOLOGY REPORT*  Clinical Data:  Pancreatic mass.  MRI ABDOMEN WITHOUT AND WITH CONTRAST  Technique:  Multiplanar multisequence MR imaging of the abdomen was performed both before and after the administration of intravenous contrast.  Contrast: 20mL MULTIHANCE GADOBENATE DIMEGLUMINE 529 MG/ML IV SOLN  Comparison:  CT of the abdomen and pelvis 01/26/2013.  Findings:  In the tail of the pancreas there is a 4.8 x 2.9 cm region that is slightly high signal intensity on T2-weighted images, low signal  intensity on pre gadolinium T1-weighted images, which has some internal areas that are more focally low signal on T1 and high signal and T2 in the extreme tail of the pancreas, likely to represent internal areas of necrosis or cystic change. The majority of this lesion demonstrates initial hypovascularity with slow progressive enhancement on post gadolinium imaging, while the cystic appearing areas demonstrate no internal enhancement on post gadolinium images.  The overall size and appearance of this lesion is very similar to prior study 01/26/2013.  This region appears completely separate from the celiac axis, superior mesenteric artery and splenoportal confluence.  The lesion is in close proximity to the splenic  vein and splenic hilum, and although there is some mild stranding in the peripancreatic fat extending toward the splenic hilum, there is no definite extension into the splenic hilum at this time.  There is a well-defined fat plane between this lesion and the adjacent left kidney at this time. No peripancreatic lymphadenopathy identified.  In the posterior aspect of the upper pole of the left kidney there is a well-defined 2.6 cm lesion that is low signal intensity on T1- weighted images, high signal intensity on T2-weighted images, without general internal enhancement, however, there is a 3 mm focus of posterior mural nodular enhancement best demonstrated on image 37 of series 15.  Mild diffuse decreased signal intensity throughout the hepatic parenchyma, compatible with hepatic steatosis.  The appearance of the visualized liver, gallbladder, spleen, bilateral adrenal glands and right kidney is unremarkable.  IMPRESSION: 1.  4.8 x 2.9 cm hypovascular lesion with progressive enhancement and internal areas of cystic degeneration or necrosis in the tail of the pancreas, concerning for a primary pancreatic adenocarcinoma.  No definite evidence of vascular invasion at this time.  At this time, there is some stranding in the peripancreatic fat extending toward the splenic hilum, but no definite splenic invasion or direct invasion involving the left kidney.  No peripancreatic lymphadenopathy.  2.  Suspicious lesion (Bosniak class III) in the upper pole of the left kidney which, although generally cystic in appearance, has a small 3 mm enhancing mural nodule. 3.  Hepatic steatosis.  These results will be called to the ordering clinician or representative by the Radiologist Assistant, and communication documented in the PACS Dashboard.   Original Report Authenticated By: Trudie Reed, M.D.       IMPRESSION: The patient has adenocarcinoma of the pancreas status post resection: stage T3, N1, M0. Pathology revealed  additional features including margins which were negative for carcinoma, lymphovascular space invasion, 2/5 lymph nodes positive.   Notable features include the patient is 62 years old and is relatively healthy and active and he has been recovering well from surgery. The patient's distal pancreatic tumor was found on a CT scan which was performed to 2 appendicitis. The patient really was asymptomatic prior to this finding regarding this specific issue. The patient's preoperative CA 19-9 level was low.  The patient is an appropriate candidate for adjuvant treatment. Given the specifics of the case, I recommend postoperative chemotherapy: initial gemcitabine followed by radiotherapy with concurrent xeloda, then additional gemcitabine at the discretion of medical oncology.  I therefore discussed with the patient the rationale for this treatment approach. We discussed the potential benefit of radiotherapy in improving local/regional control. We also discussed the potential side effects/risks of radiation treatment. All of her questions were answered.   PLAN: The patient will proceed with the above plan. The patient will be scheduled for simulation such that we  can begin treatment planning. At this time, I anticipate beginning the radiation treatment on 04/28/2013.    I spent 60 minutes face to face with the patient and more than 50% of that time was spent in counseling and/or coordination of care.    ________________________________   Radene Gunning, MD, PhD

## 2013-03-17 ENCOUNTER — Telehealth (INDEPENDENT_AMBULATORY_CARE_PROVIDER_SITE_OTHER): Payer: Self-pay | Admitting: General Surgery

## 2013-03-17 NOTE — Telephone Encounter (Signed)
Pt called to ask when his PAC would be scheduled.  Jon Pena is working on it and will call him tomorrow, hopefully.  Also wants to know when he can play golf.  Discussed with Dr. Donell Beers, who recommends limiting to driving range and putting until after she sees him in clinic next week.  Does not think he should play a round of golf yet.  He was disappointed, but understands.

## 2013-03-18 ENCOUNTER — Other Ambulatory Visit: Payer: BC Managed Care – PPO

## 2013-03-18 ENCOUNTER — Encounter: Payer: Self-pay | Admitting: *Deleted

## 2013-03-18 ENCOUNTER — Encounter (HOSPITAL_BASED_OUTPATIENT_CLINIC_OR_DEPARTMENT_OTHER): Payer: Self-pay | Admitting: *Deleted

## 2013-03-18 ENCOUNTER — Ambulatory Visit: Payer: BC Managed Care – PPO | Admitting: Nutrition

## 2013-03-18 NOTE — Progress Notes (Signed)
Just had 3 surgeries since 8/14-eating well-no pain-

## 2013-03-18 NOTE — Progress Notes (Signed)
Wants PAC lt side so he can shoot guns rt handed to hunt

## 2013-03-18 NOTE — Progress Notes (Signed)
Patient is a 62 year old male diagnosed with pancreas cancer.  He is a patient of Dr. Truett Perna.  Past medical history includes GERD and diabetes, diagnosed in April 2014.  Patient is status post appendectomy.  Medications include Pepcid and oxycodone.  Labs include glucose of 104 on September 17.  Height: 5 feet 11 inches. Weight: 201.5 pounds September 25. Usual body weight: 215 pounds 02/27/2013. BMI: 28.12.  Patient reports approximately 15 pound weight loss was unintentional and occurred around the time he had surgery.  He was experiencing severe constipation, which is now resolved.  He is able to eat small amounts of food more often.  He has "cleaned out the kitchen cupboards."  And is trying to eat more, organic foods, and a healthier diet.    Nutrition diagnosis: Food and nutrition related knowledge deficit related to new diagnosis of pancreas cancer and associated treatments as evidenced by no prior need for nutrition related information.  Intervention: Patient was educated to consume small, frequent meals with adequate calories and protein to promote weight maintenance.  I have reviewed high protein foods.  I've educated him on strategies for eating organic foods.  We've discussed food safety.  Patient was provided with multiple fact sheets and two cookbooks so that he can prepare foods at home.  Questions were answered.  Teach back method used.  Contact information given.  Monitoring, evaluation, goals: Patient will tolerate oral diet to maintain weight and minimize nutrition impact symptoms.  Next visit: Thursday, October 23, during chemotherapy.

## 2013-03-19 ENCOUNTER — Other Ambulatory Visit: Payer: Self-pay | Admitting: *Deleted

## 2013-03-19 ENCOUNTER — Other Ambulatory Visit (INDEPENDENT_AMBULATORY_CARE_PROVIDER_SITE_OTHER): Payer: Self-pay | Admitting: *Deleted

## 2013-03-19 ENCOUNTER — Telehealth (INDEPENDENT_AMBULATORY_CARE_PROVIDER_SITE_OTHER): Payer: Self-pay | Admitting: *Deleted

## 2013-03-19 DIAGNOSIS — M7989 Other specified soft tissue disorders: Secondary | ICD-10-CM

## 2013-03-19 DIAGNOSIS — C252 Malignant neoplasm of tail of pancreas: Secondary | ICD-10-CM

## 2013-03-19 DIAGNOSIS — C259 Malignant neoplasm of pancreas, unspecified: Secondary | ICD-10-CM

## 2013-03-19 MED ORDER — PROCHLORPERAZINE MALEATE 10 MG PO TABS: 10.0000 mg | ORAL_TABLET | Freq: Four times a day (QID) | ORAL | Status: DC | PRN

## 2013-03-19 MED ORDER — LIDOCAINE-PRILOCAINE 2.5-2.5 % EX CREA: TOPICAL_CREAM | CUTANEOUS | Status: DC | PRN

## 2013-03-19 NOTE — Telephone Encounter (Signed)
Order entered for Upper Extremity Duplex Venous Bilateral.  Given to [facility] RN to get pre-cert and scheduled.

## 2013-03-19 NOTE — Telephone Encounter (Signed)
Bizzare.  I would want to get a duplex on upper extremity.  tx FB

## 2013-03-19 NOTE — Telephone Encounter (Signed)
Patient called to report that beginning last night while peeling apples he began having swelling under his right arm in the arm pit area.  Patient concerned that with his history this could be an issue in the lymph nodes.  Patient is schedule for PAC insertion on 03/21/13.  Patient wanting Dr. Arita Miss opinion on this.  Explained that a message will be sent to Dr. Donell Beers and as soon as I hear back I will give him a call back.  Patient states understanding and agreeable at this time.

## 2013-03-20 ENCOUNTER — Encounter (HOSPITAL_COMMUNITY): Payer: BC Managed Care – PPO

## 2013-03-20 ENCOUNTER — Telehealth (INDEPENDENT_AMBULATORY_CARE_PROVIDER_SITE_OTHER): Payer: Self-pay | Admitting: *Deleted

## 2013-03-20 ENCOUNTER — Other Ambulatory Visit (INDEPENDENT_AMBULATORY_CARE_PROVIDER_SITE_OTHER): Payer: Self-pay | Admitting: General Surgery

## 2013-03-20 DIAGNOSIS — M7989 Other specified soft tissue disorders: Secondary | ICD-10-CM

## 2013-03-20 NOTE — Telephone Encounter (Signed)
I spoke with pt about his appt on 10/3 with an arrival time of 7:30am at [facility] (entrance A) for his upper ext. Venous duplex.

## 2013-03-20 NOTE — Progress Notes (Signed)
[facility] Psychosocial Distress Screening Clinical Social Work  Clinical Social Work was referred by distress screening protocol.  The patient scored a 8 on the Psychosocial Distress Thermometer which indicates severe distress. Clinical Social Worker Intern telephoned to assess for distress and other psychosocial needs. Patient did not answer phone and message was left for return call.   Clinical Social Worker Intern follow up needed: no  If yes, follow up plan:   Zaydee Aina [facility] Clinical Social Work Intern [employer] [facility] [phone]

## 2013-03-21 ENCOUNTER — Telehealth (INDEPENDENT_AMBULATORY_CARE_PROVIDER_SITE_OTHER): Payer: Self-pay

## 2013-03-21 ENCOUNTER — Encounter (HOSPITAL_BASED_OUTPATIENT_CLINIC_OR_DEPARTMENT_OTHER): Payer: Self-pay

## 2013-03-21 ENCOUNTER — Encounter (HOSPITAL_BASED_OUTPATIENT_CLINIC_OR_DEPARTMENT_OTHER): Payer: Self-pay | Admitting: Anesthesiology

## 2013-03-21 ENCOUNTER — Ambulatory Visit (HOSPITAL_COMMUNITY): Payer: BC Managed Care – PPO

## 2013-03-21 ENCOUNTER — Ambulatory Visit (HOSPITAL_COMMUNITY)
Admission: RE | Admit: 2013-03-21 | Discharge: 2013-03-21 | Disposition: A | Discharge status: Home / Self Care | Payer: BC Managed Care – PPO | Source: Ambulatory Visit | Attending: General Surgery | Admitting: General Surgery

## 2013-03-21 ENCOUNTER — Ambulatory Visit (HOSPITAL_BASED_OUTPATIENT_CLINIC_OR_DEPARTMENT_OTHER): Payer: BC Managed Care – PPO | Admitting: Anesthesiology

## 2013-03-21 ENCOUNTER — Ambulatory Visit (HOSPITAL_BASED_OUTPATIENT_CLINIC_OR_DEPARTMENT_OTHER)
Admission: RE | Admit: 2013-03-21 | Discharge: 2013-03-21 | Disposition: A | Discharge status: Home / Self Care | Payer: BC Managed Care – PPO | Source: Ambulatory Visit | Attending: General Surgery | Admitting: General Surgery

## 2013-03-21 ENCOUNTER — Encounter (HOSPITAL_BASED_OUTPATIENT_CLINIC_OR_DEPARTMENT_OTHER)
Admission: RE | Disposition: A | Discharge status: Home / Self Care | Payer: Self-pay | Source: Ambulatory Visit | Attending: General Surgery

## 2013-03-21 DIAGNOSIS — C259 Malignant neoplasm of pancreas, unspecified: Secondary | ICD-10-CM

## 2013-03-21 DIAGNOSIS — Z01812 Encounter for preprocedural laboratory examination: Secondary | ICD-10-CM | POA: Insufficient documentation

## 2013-03-21 DIAGNOSIS — M7989 Other specified soft tissue disorders: Secondary | ICD-10-CM

## 2013-03-21 DIAGNOSIS — C801 Malignant (primary) neoplasm, unspecified: Secondary | ICD-10-CM

## 2013-03-21 HISTORY — PX: PORTACATH PLACEMENT: SHX2246

## 2013-03-21 HISTORY — DX: Unspecified hearing loss, unspecified ear: H91.90

## 2013-03-21 LAB — URINALYSIS, ROUTINE W REFLEX MICROSCOPIC
Bilirubin Urine: NEGATIVE
Ketones, ur: NEGATIVE mg/dL
Leukocytes, UA: NEGATIVE
Nitrite: NEGATIVE
Protein, ur: NEGATIVE mg/dL
Urobilinogen, UA: 0.2 mg/dL (ref 0.0–1.0)

## 2013-03-21 LAB — GLUCOSE, CAPILLARY: Glucose-Capillary: 122 mg/dL — ABNORMAL HIGH (ref 70–99)

## 2013-03-21 SURGERY — INSERTION, TUNNELED CENTRAL VENOUS DEVICE, WITH PORT
Anesthesia: General | Site: Chest | Wound class: Clean

## 2013-03-21 MED ORDER — LIDOCAINE HCL (CARDIAC) 20 MG/ML IV SOLN
INTRAVENOUS | Status: DC | PRN
  Administered 2013-03-21: 80 mg via INTRAVENOUS

## 2013-03-21 MED ORDER — OXYCODONE HCL 5 MG PO TABS: 5.0000 mg | ORAL_TABLET | ORAL | Status: DC | PRN

## 2013-03-21 MED ORDER — SODIUM CHLORIDE 0.9 % IJ SOLN: 3.0000 mL | Freq: Two times a day (BID) | INTRAMUSCULAR | Status: DC

## 2013-03-21 MED ORDER — HEPARIN (PORCINE) IN NACL 2-0.9 UNIT/ML-% IJ SOLN
INTRAMUSCULAR | Status: DC | PRN
  Administered 2013-03-21: 1 via INTRAVENOUS

## 2013-03-21 MED ORDER — MIDAZOLAM HCL 2 MG/2ML IJ SOLN: 1.0000 mg | INTRAMUSCULAR | Status: DC | PRN

## 2013-03-21 MED ORDER — PROPOFOL 10 MG/ML IV BOLUS
INTRAVENOUS | Status: DC | PRN
  Administered 2013-03-21: 200 mg via INTRAVENOUS

## 2013-03-21 MED ORDER — FENTANYL CITRATE 0.05 MG/ML IJ SOLN
INTRAMUSCULAR | Status: DC | PRN
  Administered 2013-03-21: 100 ug via INTRAVENOUS

## 2013-03-21 MED ORDER — ACETAMINOPHEN 325 MG PO TABS: 650.0000 mg | ORAL_TABLET | ORAL | Status: DC | PRN

## 2013-03-21 MED ORDER — MIDAZOLAM HCL 5 MG/5ML IJ SOLN
INTRAMUSCULAR | Status: DC | PRN
  Administered 2013-03-21: 2 mg via INTRAVENOUS

## 2013-03-21 MED ORDER — DEXAMETHASONE SODIUM PHOSPHATE 4 MG/ML IJ SOLN
INTRAMUSCULAR | Status: DC | PRN
  Administered 2013-03-21: 10 mg via INTRAVENOUS

## 2013-03-21 MED ORDER — HYDROMORPHONE HCL PF 1 MG/ML IJ SOLN: 0.2500 mg | INTRAMUSCULAR | Status: DC | PRN

## 2013-03-21 MED ORDER — ONDANSETRON HCL 4 MG/2ML IJ SOLN: 4.0000 mg | Freq: Once | INTRAMUSCULAR | Status: DC | PRN

## 2013-03-21 MED ORDER — SODIUM CHLORIDE 0.9 % IV SOLN: 250.0000 mL | INTRAVENOUS | Status: DC | PRN

## 2013-03-21 MED ORDER — ONDANSETRON HCL 4 MG/2ML IJ SOLN
INTRAMUSCULAR | Status: DC | PRN
  Administered 2013-03-21: 4 mg via INTRAVENOUS

## 2013-03-21 MED ORDER — FENTANYL CITRATE 0.05 MG/ML IJ SOLN: 50.0000 ug | INTRAMUSCULAR | Status: DC | PRN

## 2013-03-21 MED ORDER — ONDANSETRON HCL 4 MG/2ML IJ SOLN: 4.0000 mg | Freq: Four times a day (QID) | INTRAMUSCULAR | Status: DC | PRN

## 2013-03-21 MED ORDER — OXYCODONE-ACETAMINOPHEN 5-325 MG PO TABS: 1.0000 | ORAL_TABLET | ORAL | Status: DC | PRN

## 2013-03-21 MED ORDER — CEFAZOLIN SODIUM-DEXTROSE 2-3 GM-% IV SOLR
2.0000 g | INTRAVENOUS | Status: AC
  Administered 2013-03-21: 2 g via INTRAVENOUS

## 2013-03-21 MED ORDER — SODIUM CHLORIDE 0.9 % IJ SOLN: 3.0000 mL | INTRAMUSCULAR | Status: DC | PRN

## 2013-03-21 MED ORDER — OXYCODONE HCL 5 MG/5ML PO SOLN: 5.0000 mg | Freq: Once | ORAL | Status: DC | PRN

## 2013-03-21 MED ORDER — ACETAMINOPHEN 650 MG RE SUPP: 650.0000 mg | RECTAL | Status: DC | PRN

## 2013-03-21 MED ORDER — LACTATED RINGERS IV SOLN
INTRAVENOUS | Status: DC
  Administered 2013-03-21 (×2): via INTRAVENOUS

## 2013-03-21 MED ORDER — BUPIVACAINE-EPINEPHRINE 0.5% -1:200000 IJ SOLN
INTRAMUSCULAR | Status: DC | PRN
  Administered 2013-03-21: 15 mL

## 2013-03-21 MED ORDER — OXYCODONE HCL 5 MG PO TABS: 5.0000 mg | ORAL_TABLET | Freq: Once | ORAL | Status: DC | PRN

## 2013-03-21 MED ORDER — HEPARIN SOD (PORK) LOCK FLUSH 100 UNIT/ML IV SOLN
INTRAVENOUS | Status: DC | PRN
  Administered 2013-03-21: 500 [IU] via INTRAVENOUS

## 2013-03-21 MED ORDER — CHLORHEXIDINE GLUCONATE 4 % EX LIQD: 1.0000 "application " | Freq: Once | CUTANEOUS | Status: DC

## 2013-03-21 SURGICAL SUPPLY — 42 items
ADH SKN CLS APL DERMABOND .7 (GAUZE/BANDAGES/DRESSINGS) ×1
BAG DECANTER FOR FLEXI CONT (MISCELLANEOUS) ×2 IMPLANT
BLADE HEX COATED 2.75 (ELECTRODE) ×2 IMPLANT
BLADE SURG 11 STRL SS (BLADE) ×2 IMPLANT
BLADE SURG 15 STRL LF DISP TIS (BLADE) ×1 IMPLANT
BLADE SURG 15 STRL SS (BLADE) ×2
CHLORAPREP W/TINT 26ML (MISCELLANEOUS) ×2 IMPLANT
CLOTH BEACON ORANGE TIMEOUT ST (SAFETY) ×2 IMPLANT
COVER MAYO STAND STRL (DRAPES) ×2 IMPLANT
COVER TABLE BACK 60X90 (DRAPES) ×2 IMPLANT
DECANTER SPIKE VIAL GLASS SM (MISCELLANEOUS) IMPLANT
DERMABOND ADVANCED (GAUZE/BANDAGES/DRESSINGS) ×1
DERMABOND ADVANCED .7 DNX12 (GAUZE/BANDAGES/DRESSINGS) ×1 IMPLANT
DRAPE C-ARM 42X72 X-RAY (DRAPES) ×2 IMPLANT
DRAPE LAPAROTOMY TRNSV 102X78 (DRAPE) ×2 IMPLANT
DRAPE UTILITY XL STRL (DRAPES) ×2 IMPLANT
DRSG TEGADERM 4X4.75 (GAUZE/BANDAGES/DRESSINGS) IMPLANT
ELECT REM PT RETURN 9FT ADLT (ELECTROSURGICAL) ×2
ELECTRODE REM PT RTRN 9FT ADLT (ELECTROSURGICAL) ×1 IMPLANT
GLOVE BIO SURGEON STRL SZ 6 (GLOVE) ×2 IMPLANT
GLOVE BIO SURGEON STRL SZ 6.5 (GLOVE) ×1 IMPLANT
GLOVE BIOGEL PI IND STRL 6.5 (GLOVE) ×1 IMPLANT
GLOVE BIOGEL PI IND STRL 7.0 (GLOVE) IMPLANT
GLOVE BIOGEL PI INDICATOR 6.5 (GLOVE) ×1
GLOVE BIOGEL PI INDICATOR 7.0 (GLOVE) ×1
GOWN PREVENTION PLUS XLARGE (GOWN DISPOSABLE) ×2 IMPLANT
GOWN PREVENTION PLUS XXLARGE (GOWN DISPOSABLE) ×2 IMPLANT
KIT PORT POWER 8FR ISP CVUE (Catheter) ×1 IMPLANT
NDL HYPO 25X1 1.5 SAFETY (NEEDLE) ×1 IMPLANT
NEEDLE HYPO 25X1 1.5 SAFETY (NEEDLE) ×2 IMPLANT
PACK BASIN DAY SURGERY FS (CUSTOM PROCEDURE TRAY) ×2 IMPLANT
PENCIL BUTTON HOLSTER BLD 10FT (ELECTRODE) ×2 IMPLANT
SLEEVE SCD COMPRESS KNEE MED (MISCELLANEOUS) ×2 IMPLANT
SUT MNCRL AB 4-0 PS2 18 (SUTURE) ×2 IMPLANT
SUT PROLENE 2 0 SH DA (SUTURE) ×4 IMPLANT
SUT VIC AB 3-0 SH 27 (SUTURE) ×2
SUT VIC AB 3-0 SH 27X BRD (SUTURE) ×1 IMPLANT
SUT VICRYL 3-0 CR8 SH (SUTURE) IMPLANT
SYR 5ML LUER SLIP (SYRINGE) ×2 IMPLANT
SYR CONTROL 10ML LL (SYRINGE) ×2 IMPLANT
TOWEL OR 17X24 6PK STRL BLUE (TOWEL DISPOSABLE) ×2 IMPLANT
TOWEL OR NON WOVEN STRL DISP B (DISPOSABLE) ×2 IMPLANT

## 2013-03-21 NOTE — H&P (View-Only) (Signed)
Patient ID: Jon Pena, male   DOB: 28-Aug-1950, 62 y.o.   MRN: [id number] 5 Days Post-Op  Subjective: + Flatus, no sig stool yet.    No SOB, no nausea.  Tolerated full liquids.    Objective: Vital signs in last 24 hours: Temp:  [98 F (36.7 C)-99.3 F (37.4 C)] 98.8 F (37.1 C) (09/16 0500) Pulse Rate:  [60-77] 61 (09/16 0500) Resp:  [18-19] 18 (09/16 0500) BP: (134-156)/(81-91) 156/91 mmHg (09/16 0500) SpO2:  [96 %-100 %] 96 % (09/16 0500) Last BM Date: 03/02/13  Intake/Output from previous day: 09/15 0701 - 09/16 0700 In: 960 [P.O.:960] Out: 70 [Drains:70] Intake/Output this shift:    General appearance: alert. Sitting up. No distress. Resp:  Breathing comfortably.   GI: soft. Wounds clean. Mildly distended.  No erythema.  Lab Results:  Results for orders placed during the hospital encounter of 02/27/13 (from the past 24 hour(s))  GLUCOSE, CAPILLARY     Status: Abnormal   Collection Time    03/03/13 11:03 AM      Result Value Range   Glucose-Capillary 141 (*) 70 - 99 mg/dL   Comment 1 Notify RN    GLUCOSE, CAPILLARY     Status: Abnormal   Collection Time    03/03/13  4:02 PM      Result Value Range   Glucose-Capillary 144 (*) 70 - 99 mg/dL   Comment 1 Notify RN    GLUCOSE, CAPILLARY     Status: Abnormal   Collection Time    03/03/13  7:33 PM      Result Value Range   Glucose-Capillary 157 (*) 70 - 99 mg/dL  GLUCOSE, CAPILLARY     Status: Abnormal   Collection Time    03/04/13 12:06 AM      Result Value Range   Glucose-Capillary 118 (*) 70 - 99 mg/dL  GLUCOSE, CAPILLARY     Status: Abnormal   Collection Time    03/04/13  4:11 AM      Result Value Range   Glucose-Capillary 130 (*) 70 - 99 mg/dL  GLUCOSE, CAPILLARY     Status: Abnormal   Collection Time    03/04/13  7:47 AM      Result Value Range   Glucose-Capillary 123 (*) 70 - 99 mg/dL   Comment 1 Notify RN       Studies/Results:   . guaifenesin  200 mg Oral TID PC  . heparin  5,000 Units  Subcutaneous Q8H  . insulin aspart  0-15 Units Subcutaneous Q4H  . pantoprazole  40 mg Oral Daily     Assessment/Plan: s/p Procedure(s): LAPAROSCOPIC PANCREATECTOMY LAPAROSCOPIC SPLENECTOMY  POD #5. Stable.  Advance to low fat diet.  Marland Kitchen     leukocytosis. Improving.  Recheck.   Hyponatremia. Saline lock  Atelectasis. Encouraged incentive spirometry and ambulation. Add expectorant, flutter valve, ambulate more.  Tobacco user.   Hopefully home tomorrow.    LOS: 5 days    [facility] 03/04/2013  . .prob

## 2013-03-21 NOTE — Telephone Encounter (Signed)
LM for pt to call.  [place] of his arm was normal.  No evidence of a clot.

## 2013-03-21 NOTE — Interval H&P Note (Signed)
History and Physical Interval Note:  03/21/2013 12:10 PM  Jon Pena  has presented today for surgery, with the diagnosis of pancreatic cancer  The various methods of treatment have been discussed with the patient and family. After consideration of risks, benefits and other options for treatment, the patient has consented to  Procedure(s): INSERTION PORT-A-CATH (N/A) as a surgical intervention .  The patient's history has been reviewed, patient examined, no change in status, stable for surgery.  I have reviewed the patient's chart and labs.  Questions were answered to the patient's satisfaction.     Krissy Orebaugh

## 2013-03-21 NOTE — Anesthesia Preprocedure Evaluation (Signed)
Anesthesia Evaluation  Patient identified by MRN, date of birth, ID band Patient awake    Reviewed: Allergy & Precautions, H&P , NPO status , Patient's Chart, lab work & pertinent test results  Airway Mallampati: I TM Distance: >3 FB Neck ROM: Full    Dental  (+) Teeth Intact and Dental Advisory Given   Pulmonary  breath sounds clear to auscultation        Cardiovascular Rhythm:Regular Rate:Normal     Neuro/Psych    GI/Hepatic GERD-  Medicated and Controlled,  Endo/Other    Renal/GU      Musculoskeletal   Abdominal   Peds  Hematology   Anesthesia Other Findings Hx of pancreatic ca s/p partial pancreatectomy  Reproductive/Obstetrics                           Anesthesia Physical Anesthesia Plan  ASA: III  Anesthesia Plan: General   Post-op Pain Management:    Induction: Intravenous  Airway Management Planned: LMA  Additional Equipment:   Intra-op Plan:   Post-operative Plan: Extubation in OR  Informed Consent: I have reviewed the patients History and Physical, chart, labs and discussed the procedure including the risks, benefits and alternatives for the proposed anesthesia with the patient or authorized representative who has indicated his/her understanding and acceptance.   Dental advisory given  Plan Discussed with: CRNA, Anesthesiologist and Surgeon  Anesthesia Plan Comments:         Anesthesia Quick Evaluation

## 2013-03-21 NOTE — Anesthesia Postprocedure Evaluation (Signed)
  Anesthesia Post-op Note  Patient: Jon Pena  Procedure(s) Performed: Procedure(s): INSERTION PORT-A-CATH (N/A)  Patient Location: PACU  Anesthesia Type:General  Level of Consciousness: awake, alert  and oriented  Airway and Oxygen Therapy: Patient Spontanous Breathing  Post-op Pain: none  Post-op Assessment: Post-op Vital signs reviewed  Post-op Vital Signs: Reviewed  Complications: No apparent anesthesia complications

## 2013-03-21 NOTE — Progress Notes (Signed)
Quick Note:  Please let pt know that ultrasound of arm was normal. No evidence of clot. ______

## 2013-03-21 NOTE — Progress Notes (Signed)
Right upper extremity venous duplex:  No evidence of DVT or superficial thrombosis.    

## 2013-03-21 NOTE — Op Note (Signed)
PREOPERATIVE DIAGNOSIS:  Pancreatic cancer     POSTOPERATIVE DIAGNOSIS:  Same     PROCEDURE: left subclavian port placement, Bard ClearVue  Power Port, MRI safe, 8-French.      SURGEON:  Almond Lint, MD      ANESTHESIA:  General   FINDINGS:  Good venous return, easy flush, and tip of the catheter and   SVC 27 cm.      SPECIMEN:  None.      ESTIMATED BLOOD LOSS:  Minimal.      COMPLICATIONS:  None known.      PROCEDURE:  Pt was identified in the holding area and taken to   the operating room, where patient was placed supine on the operating room   table.  General anesthesia was induced.  Patient's arms were tucked and the upper   chest and neck were prepped and draped in sterile fashion.  Time-out was   performed according to the surgical safety check list.  When all was   correct, we continued.   Local anesthetic was administered over this   area at the angle of the clavicle.  The vein was accessed with 1 pass of the needle.  However the wire would not pass.  The vein was accessed at a different angle with 2 passes of the needle.   There was good venous return and the wire passed easily with no ectopy.   Fluoroscopy was used to confirm that the wire was in the vena cava.      The patient was placed back level and the area for the pocket was anethetized   with local anesthetic.  A 3-cm transverse incision was made with a #15   blade.  Cautery was used to divide the subcutaneous tissues down to the   pectoralis muscle.  An [employer] retractor was used to elevate the skin   while a pocket was created on top of the pectoralis fascia.  The port   was placed into the pocket to confirm that it was of adequate size.  The   catheter was preattached to the port.  The port was then secured to the   pectoralis fascia with four 2-0 Prolene sutures.  These were clamped and   not tied down yet.    The catheter was tunneled through to the wire exit   site.  The catheter was placed along the  wire to determine what length it should be to be in the SVC.  The catheter was cut at 27 cm.  The tunneler sheath and dilator were passed over the wire and the dilator and wire were removed.  The catheter was advanced through the tunneler sheath and the tunneler sheath was pulled away.  Care was taken to keep the catheter in the tunneler sheath as this occurred. This was advanced and the tunneler sheath was removed.  There was good venous   return and easy flush of the catheter.  The Prolene sutures were tied   down to the pectoral fascia.  The skin was reapproximated using 3-0   Vicryl interrupted deep dermal sutures.    Fluoroscopy was used to re-confirm good position of the catheter.  The skin   was then closed using 4-0 Monocryl in a subcuticular fashion.  The port was flushed with concentrated heparin flush as well.  The wounds were then cleaned, dried, and dressed with Dermabond.  The patient was awakened from anesthesia and taken to the PACU in stable condition.  Needle, sponge, and  instrument counts were correct.               Jon Klein, MD

## 2013-03-21 NOTE — Transfer of Care (Signed)
Immediate Anesthesia Transfer of Care Note  Patient: Jon Pena  Procedure(s) Performed: Procedure(s): INSERTION PORT-A-CATH (N/A)  Patient Location: PACU  Anesthesia Type:General  Level of Consciousness: sedated  Airway & Oxygen Therapy: Patient Spontanous Breathing and Patient connected to face mask oxygen  Post-op Assessment: Report given to PACU RN and Post -op Vital signs reviewed and stable  Post vital signs: Reviewed and stable  Complications: No apparent anesthesia complications

## 2013-03-21 NOTE — Anesthesia Procedure Notes (Signed)
Procedure Name: LMA Insertion Date/Time: 03/21/2013 12:20 PM Performed by: Gar Gibbon Pre-anesthesia Checklist: Patient identified, Emergency Drugs available, Suction available and Patient being monitored Patient Re-evaluated:Patient Re-evaluated prior to inductionOxygen Delivery Method: Circle System Utilized Preoxygenation: Pre-oxygenation with 100% oxygen Intubation Type: IV induction Ventilation: Mask ventilation without difficulty LMA: LMA flexible inserted LMA Size: 4.0 Number of attempts: 1 Airway Equipment and Method: bite block Placement Confirmation: positive ETCO2 Tube secured with: Tape Dental Injury: Teeth and Oropharynx as per pre-operative assessment

## 2013-03-23 ENCOUNTER — Other Ambulatory Visit: Payer: Self-pay | Admitting: Oncology

## 2013-03-24 ENCOUNTER — Encounter (HOSPITAL_BASED_OUTPATIENT_CLINIC_OR_DEPARTMENT_OTHER): Payer: Self-pay | Admitting: General Surgery

## 2013-03-26 ENCOUNTER — Encounter (INDEPENDENT_AMBULATORY_CARE_PROVIDER_SITE_OTHER): Payer: Self-pay | Admitting: General Surgery

## 2013-03-26 ENCOUNTER — Ambulatory Visit (INDEPENDENT_AMBULATORY_CARE_PROVIDER_SITE_OTHER): Payer: BC Managed Care – PPO | Admitting: General Surgery

## 2013-03-26 VITALS — BP 116/80 | HR 68 | Temp 97.6°F | Resp 14 | Ht 71.0 in | Wt 202.4 lb

## 2013-03-26 DIAGNOSIS — C252 Malignant neoplasm of tail of pancreas: Secondary | ICD-10-CM

## 2013-03-26 NOTE — Progress Notes (Signed)
HISTORY: Doing well.  No soreness from port site.  Good position on CXR.  He has stopped smoking and drinking since surgery.      EXAM: General:  Alert and oriented.   Incision:  Healing well.     PATHOLOGY: n/a   ASSESSMENT AND PLAN:   Malignant neoplasm of tail of pancreas Starting chemo tomorrow.  No evidence of surgical complications.        Maudry Diego, MD Surgical Oncology, General & Endocrine Surgery [facility]  Kirk Ruths, MD Karleen Hampshire, MD

## 2013-03-26 NOTE — Patient Instructions (Signed)
Follow up with me in 3 months

## 2013-03-26 NOTE — Assessment & Plan Note (Signed)
Starting chemo tomorrow.  No evidence of surgical complications.

## 2013-03-27 ENCOUNTER — Ambulatory Visit (HOSPITAL_BASED_OUTPATIENT_CLINIC_OR_DEPARTMENT_OTHER): Payer: BC Managed Care – PPO

## 2013-03-27 VITALS — BP 122/80 | HR 63 | Temp 97.4°F

## 2013-03-27 DIAGNOSIS — Z5111 Encounter for antineoplastic chemotherapy: Secondary | ICD-10-CM

## 2013-03-27 DIAGNOSIS — C259 Malignant neoplasm of pancreas, unspecified: Secondary | ICD-10-CM

## 2013-03-27 DIAGNOSIS — C801 Malignant (primary) neoplasm, unspecified: Secondary | ICD-10-CM

## 2013-03-27 MED ORDER — SODIUM CHLORIDE 0.9 % IV SOLN
Freq: Once | INTRAVENOUS | Status: AC
  Administered 2013-03-27: 12:00:00 via INTRAVENOUS

## 2013-03-27 MED ORDER — SODIUM CHLORIDE 0.9 % IJ SOLN
10.0000 mL | INTRAMUSCULAR | Status: DC | PRN
  Administered 2013-03-27: 10 mL
  Filled 2013-03-27: qty 10

## 2013-03-27 MED ORDER — HEPARIN SOD (PORK) LOCK FLUSH 100 UNIT/ML IV SOLN
500.0000 [IU] | Freq: Once | INTRAVENOUS | Status: AC | PRN
  Administered 2013-03-27: 500 [IU]
  Filled 2013-03-27: qty 5

## 2013-03-27 MED ORDER — GEMCITABINE HCL CHEMO INJECTION 1 GM/26.3ML
1000.0000 mg/m2 | Freq: Once | INTRAVENOUS | Status: AC
  Administered 2013-03-27: 2128 mg via INTRAVENOUS
  Filled 2013-03-27: qty 55.97

## 2013-03-27 NOTE — Patient Instructions (Signed)
[facility] Discharge Instructions for Patients Receiving Chemotherapy  Today you received the following chemotherapy agent: Gemzar (Gemcitabine)  To help prevent nausea and vomiting after your treatment, we encourage you to take your nausea medication: compazine 10 mg every 6 hours as needed   If you develop nausea and vomiting that is not controlled by your nausea medication, call the clinic.   Use Imodium as needed for diarrhea.  BELOW ARE SYMPTOMS THAT SHOULD BE REPORTED IMMEDIATELY:  *FEVER GREATER THAN 100.5 F  *CHILLS WITH OR WITHOUT FEVER  NAUSEA AND VOMITING THAT IS NOT CONTROLLED WITH YOUR NAUSEA MEDICATION  *UNUSUAL SHORTNESS OF BREATH  *UNUSUAL BRUISING OR BLEEDING  TENDERNESS IN MOUTH AND THROAT WITH OR WITHOUT PRESENCE OF ULCERS  *URINARY PROBLEMS  *BOWEL PROBLEMS  UNUSUAL RASH Items with * indicate a potential emergency and should be followed up as soon as possible.  Feel free to call the clinic you have any questions or concerns. The clinic phone number is [phone].  It has been a pleasure to serve you today!

## 2013-03-27 NOTE — Progress Notes (Signed)
Patient took his po Compazine 10 mg at home today at 10 am-held this as premed today. Reviewed potential side effects and management with patient using TEACH back method.

## 2013-03-28 ENCOUNTER — Telehealth: Payer: Self-pay

## 2013-03-28 NOTE — Telephone Encounter (Signed)
Message copied by Lorine Bears on Fri Mar 28, 2013 12:39 PM ------      Message from: Wandalee Ferdinand      Created: Thu Mar 27, 2013  3:12 PM      Regarding: Chemo Follow Up Call       1st Gemzar-call on 03/28/13 ------

## 2013-03-28 NOTE — Telephone Encounter (Signed)
Jon Pena is doing fine after treatment.  No nausea , vomiting , diarrhea.  He actually is did a light workout this morning. He knows to call [phone] if any issues arise.

## 2013-03-30 ENCOUNTER — Other Ambulatory Visit: Payer: Self-pay | Admitting: Oncology

## 2013-04-03 ENCOUNTER — Ambulatory Visit (HOSPITAL_BASED_OUTPATIENT_CLINIC_OR_DEPARTMENT_OTHER): Payer: BC Managed Care – PPO | Admitting: Nurse Practitioner

## 2013-04-03 ENCOUNTER — Ambulatory Visit (HOSPITAL_BASED_OUTPATIENT_CLINIC_OR_DEPARTMENT_OTHER): Payer: BC Managed Care – PPO

## 2013-04-03 ENCOUNTER — Other Ambulatory Visit (HOSPITAL_BASED_OUTPATIENT_CLINIC_OR_DEPARTMENT_OTHER): Payer: BC Managed Care – PPO

## 2013-04-03 ENCOUNTER — Telehealth: Payer: Self-pay | Admitting: Oncology

## 2013-04-03 VITALS — BP 121/70 | HR 56 | Temp 97.8°F | Resp 20 | Ht 71.0 in | Wt 205.6 lb

## 2013-04-03 DIAGNOSIS — C259 Malignant neoplasm of pancreas, unspecified: Secondary | ICD-10-CM

## 2013-04-03 DIAGNOSIS — C252 Malignant neoplasm of tail of pancreas: Secondary | ICD-10-CM

## 2013-04-03 DIAGNOSIS — Z5111 Encounter for antineoplastic chemotherapy: Secondary | ICD-10-CM

## 2013-04-03 DIAGNOSIS — N2889 Other specified disorders of kidney and ureter: Secondary | ICD-10-CM

## 2013-04-03 DIAGNOSIS — K3580 Unspecified acute appendicitis: Secondary | ICD-10-CM

## 2013-04-03 DIAGNOSIS — Q619 Cystic kidney disease, unspecified: Secondary | ICD-10-CM

## 2013-04-03 DIAGNOSIS — K8689 Other specified diseases of pancreas: Secondary | ICD-10-CM

## 2013-04-03 DIAGNOSIS — C801 Malignant (primary) neoplasm, unspecified: Secondary | ICD-10-CM

## 2013-04-03 LAB — CBC WITH DIFFERENTIAL/PLATELET
BASO%: 0.6 % (ref 0.0–2.0)
Basophils Absolute: 0 10*3/uL (ref 0.0–0.1)
Eosinophils Absolute: 0.3 10*3/uL (ref 0.0–0.5)
HCT: 41.4 % (ref 38.4–49.9)
HGB: 14.1 g/dL (ref 13.0–17.1)
LYMPH%: 34 % (ref 14.0–49.0)
MCHC: 34.1 g/dL (ref 32.0–36.0)
MONO#: 0.8 10*3/uL (ref 0.1–0.9)
MONO%: 11.8 % (ref 0.0–14.0)
NEUT#: 3.5 10*3/uL (ref 1.5–6.5)
Platelets: 212 10*3/uL (ref 140–400)
RBC: 4.67 10*6/uL (ref 4.20–5.82)
WBC: 7 10*3/uL (ref 4.0–10.3)
lymph#: 2.4 10*3/uL (ref 0.9–3.3)
nRBC: 2 % — ABNORMAL HIGH (ref 0–0)

## 2013-04-03 MED ORDER — PROCHLORPERAZINE MALEATE 10 MG PO TABS
10.0000 mg | ORAL_TABLET | Freq: Once | ORAL | Status: AC
  Administered 2013-04-03: 10 mg via ORAL

## 2013-04-03 MED ORDER — SODIUM CHLORIDE 0.9 % IV SOLN
1000.0000 mg/m2 | Freq: Once | INTRAVENOUS | Status: AC
  Administered 2013-04-03: 2128 mg via INTRAVENOUS
  Filled 2013-04-03: qty 55.97

## 2013-04-03 MED ORDER — SODIUM CHLORIDE 0.9 % IJ SOLN
10.0000 mL | INTRAMUSCULAR | Status: DC | PRN
  Administered 2013-04-03: 10 mL
  Filled 2013-04-03: qty 10

## 2013-04-03 MED ORDER — SODIUM CHLORIDE 0.9 % IV SOLN
Freq: Once | INTRAVENOUS | Status: AC
  Administered 2013-04-03: 15:00:00 via INTRAVENOUS

## 2013-04-03 MED ORDER — HEPARIN SOD (PORK) LOCK FLUSH 100 UNIT/ML IV SOLN
500.0000 [IU] | Freq: Once | INTRAVENOUS | Status: AC | PRN
  Administered 2013-04-03: 500 [IU]
  Filled 2013-04-03: qty 5

## 2013-04-03 MED ORDER — PROCHLORPERAZINE MALEATE 10 MG PO TABS
ORAL_TABLET | ORAL | Status: AC
  Filled 2013-04-03: qty 1

## 2013-04-03 NOTE — Progress Notes (Signed)
OFFICE PROGRESS NOTE  Interval history:  Jon Pena is seen for followup of pancreas cancer. He completed week 1 adjuvant gemcitabine on 03/27/2013. He denies nausea/vomiting. No mouth sores. No diarrhea or constipation. No skin rash. He denies shortness of breath, cough and fever. He denies pain. No numbness or tingling in his hands or feet. 2 days following the chemotherapy he in general did not feel well. No specific complaints.   Objective: Blood pressure 121/70, pulse 56, temperature 97.8 F (36.6 C), temperature source Oral, resp. rate 20, height 5\' 11"  (1.803 m), weight 205 lb 9.6 oz (93.26 kg).  Oropharynx is without thrush or ulceration. Lungs are clear. No wheezes or rales. Regular cardiac rhythm. Port-A-Cath site without erythema. Abdomen soft and nontender. No hepatomegaly. Extremities without edema. Calves soft and nontender.  Lab Results: Lab Results  Component Value Date   WBC 7.0 04/03/2013   HGB 14.1 04/03/2013   HCT 41.4 04/03/2013   MCV 88.7 04/03/2013   PLT 212 04/03/2013    Chemistry:    Chemistry      Component Value Date/Time   NA 136 03/04/2013 1140   K 3.7 03/04/2013 1140   CL 96 03/04/2013 1140   CO2 29 03/04/2013 1140   BUN 10 03/04/2013 1140   CREATININE 0.82 03/04/2013 1140      Component Value Date/Time   CALCIUM 9.1 03/04/2013 1140   ALKPHOS 72 02/25/2013 0842   AST 17 02/25/2013 0842   ALT 22 02/25/2013 0842   BILITOT 0.1* 02/25/2013 0842       Studies/Results: Dg Chest Port 1 View  03/21/2013   CLINICAL DATA:  Pancreatic carcinoma and status post Port-A-Cath placement.  EXAM: PORTABLE CHEST - 1 VIEW  COMPARISON:  03/01/2013  FINDINGS: Left subclavian Port-A-Cath has been placed with the catheter tip at the cavoatrial junction. No pneumothorax is present. Stable chronic lung disease with scattered areas of bilateral parenchymal scarring. No edema, airspace consolidation or pleural fluid is identified. The heart size is within normal limits.  IMPRESSION:  Port-A-Cath tip at cavoatrial junction. No pneumothorax identified.   Electronically Signed   By: Irish Lack M.D.   On: 03/21/2013 13:46   Dg Fluoro Guide Cv Line-no Report  03/21/2013   CLINICAL DATA: placement of port   FLOURO GUIDE CV LINE  Fluoroscopy was utilized by the requesting physician.  No radiographic  interpretation.     Medications: I have reviewed the patient's current medications.  Assessment/Plan:  1. 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  Initiation of adjuvant gemcitabine 03/27/2013. 2. Left renal cyst with a mural nodular component on MRI 02/20/2013. Indeterminate. 3. Acute appendicitis status post laparoscopic appendectomy 01/26/2013. 4. Port-A-Cath placement 03/21/2013.  Disposition-Jon Pena overall tolerated the first treatment with gemcitabine well. Plan to proceed with week 2 today as scheduled. He will return for week 3 on 04/10/2013. He is scheduled for radiation simulation on 04/17/2013. We anticipate he will begin the course of radiation/Xeloda on 04/28/2013. We will see him in followup that day. We again reviewed potential toxicities associated with Xeloda.  Plan reviewed with Dr. Truett Perna.    Lonna Cobb ANP/GNP-BC

## 2013-04-03 NOTE — Patient Instructions (Signed)
[facility] Discharge Instructions for Patients Receiving Chemotherapy  Today you received the following chemotherapy agents gemcitibine. To help prevent nausea and vomiting after your treatment, we encourage you to take your nausea medication compazine.   If you develop nausea and vomiting that is not controlled by your nausea medication, call the clinic.   BELOW ARE SYMPTOMS THAT SHOULD BE REPORTED IMMEDIATELY:  *FEVER GREATER THAN 100.5 F  *CHILLS WITH OR WITHOUT FEVER  NAUSEA AND VOMITING THAT IS NOT CONTROLLED WITH YOUR NAUSEA MEDICATION  *UNUSUAL SHORTNESS OF BREATH  *UNUSUAL BRUISING OR BLEEDING  TENDERNESS IN MOUTH AND THROAT WITH OR WITHOUT PRESENCE OF ULCERS  *URINARY PROBLEMS  *BOWEL PROBLEMS  UNUSUAL RASH Items with * indicate a potential emergency and should be followed up as soon as possible.  Feel free to call the clinic you have any questions or concerns. The clinic phone number is [phone].    

## 2013-04-03 NOTE — Telephone Encounter (Signed)
gv pt appt schedule for October thru December. Per pt no more tx after 10/22. Confirmed w/LT.

## 2013-04-08 ENCOUNTER — Other Ambulatory Visit: Payer: Self-pay | Admitting: Oncology

## 2013-04-10 ENCOUNTER — Other Ambulatory Visit (HOSPITAL_BASED_OUTPATIENT_CLINIC_OR_DEPARTMENT_OTHER): Payer: BC Managed Care – PPO | Admitting: Lab

## 2013-04-10 ENCOUNTER — Ambulatory Visit (HOSPITAL_BASED_OUTPATIENT_CLINIC_OR_DEPARTMENT_OTHER): Payer: BC Managed Care – PPO

## 2013-04-10 ENCOUNTER — Ambulatory Visit: Payer: BC Managed Care – PPO | Admitting: Nutrition

## 2013-04-10 VITALS — BP 137/76 | HR 58 | Temp 97.2°F

## 2013-04-10 DIAGNOSIS — Z5111 Encounter for antineoplastic chemotherapy: Secondary | ICD-10-CM

## 2013-04-10 DIAGNOSIS — C252 Malignant neoplasm of tail of pancreas: Secondary | ICD-10-CM

## 2013-04-10 DIAGNOSIS — N2889 Other specified disorders of kidney and ureter: Secondary | ICD-10-CM

## 2013-04-10 DIAGNOSIS — C801 Malignant (primary) neoplasm, unspecified: Secondary | ICD-10-CM

## 2013-04-10 DIAGNOSIS — C259 Malignant neoplasm of pancreas, unspecified: Secondary | ICD-10-CM

## 2013-04-10 DIAGNOSIS — N289 Disorder of kidney and ureter, unspecified: Secondary | ICD-10-CM

## 2013-04-10 LAB — CBC WITH DIFFERENTIAL/PLATELET
Basophils Absolute: 0 10*3/uL (ref 0.0–0.1)
Eosinophils Absolute: 0.1 10*3/uL (ref 0.0–0.5)
HCT: 42.1 % (ref 38.4–49.9)
HGB: 14.2 g/dL (ref 13.0–17.1)
MCHC: 33.7 g/dL (ref 32.0–36.0)
MONO#: 0.4 10*3/uL (ref 0.1–0.9)
NEUT#: 2.1 10*3/uL (ref 1.5–6.5)
RDW: 13.8 % (ref 11.0–14.6)
lymph#: 1.5 10*3/uL (ref 0.9–3.3)
nRBC: 3 % — ABNORMAL HIGH (ref 0–0)

## 2013-04-10 MED ORDER — SODIUM CHLORIDE 0.9 % IV SOLN
1000.0000 mg/m2 | Freq: Once | INTRAVENOUS | Status: AC
  Administered 2013-04-10: 2128 mg via INTRAVENOUS
  Filled 2013-04-10: qty 56

## 2013-04-10 MED ORDER — SODIUM CHLORIDE 0.9 % IJ SOLN
10.0000 mL | INTRAMUSCULAR | Status: DC | PRN
  Administered 2013-04-10: 10 mL
  Filled 2013-04-10: qty 10

## 2013-04-10 MED ORDER — HEPARIN SOD (PORK) LOCK FLUSH 100 UNIT/ML IV SOLN
500.0000 [IU] | Freq: Once | INTRAVENOUS | Status: AC | PRN
  Administered 2013-04-10: 500 [IU]
  Filled 2013-04-10: qty 5

## 2013-04-10 MED ORDER — SODIUM CHLORIDE 0.9 % IV SOLN
Freq: Once | INTRAVENOUS | Status: AC
  Administered 2013-04-10: 11:00:00 via INTRAVENOUS

## 2013-04-10 MED ORDER — PROCHLORPERAZINE MALEATE 10 MG PO TABS: 10.0000 mg | ORAL_TABLET | Freq: Once | ORAL | Status: DC

## 2013-04-10 MED ORDER — PROCHLORPERAZINE MALEATE 10 MG PO TABS
ORAL_TABLET | ORAL | Status: AC
  Filled 2013-04-10: qty 1

## 2013-04-10 NOTE — Progress Notes (Signed)
Patient took his Compazine 10 mg tablet at home this morning at 09:30 am. Per Dr. Truett Perna, do not give additional Compazine.

## 2013-04-10 NOTE — Patient Instructions (Signed)
[facility] Discharge Instructions for Patients Receiving Chemotherapy  Today you received the following chemotherapy agents Gemzar.  To help prevent nausea and vomiting after your treatment, we encourage you to take your nausea medication as prescribed.   If you develop nausea and vomiting that is not controlled by your nausea medication, call the clinic.   BELOW ARE SYMPTOMS THAT SHOULD BE REPORTED IMMEDIATELY:  *FEVER GREATER THAN 100.5 F  *CHILLS WITH OR WITHOUT FEVER  NAUSEA AND VOMITING THAT IS NOT CONTROLLED WITH YOUR NAUSEA MEDICATION  *UNUSUAL SHORTNESS OF BREATH  *UNUSUAL BRUISING OR BLEEDING  TENDERNESS IN MOUTH AND THROAT WITH OR WITHOUT PRESENCE OF ULCERS  *URINARY PROBLEMS  *BOWEL PROBLEMS  UNUSUAL RASH Items with * indicate a potential emergency and should be followed up as soon as possible.  Feel free to call the clinic you have any questions or concerns. The clinic phone number is [phone].    

## 2013-04-10 NOTE — Progress Notes (Signed)
Followup completed with patient in chemotherapy.  Patient reports he feels well.  He denies nutrition side effects.  His weight has increased to 205.6 pounds on October 16 from 201.5 pounds September 25.  Patient continues to consume a healthier diet including organic foods, when possible.  Patient had questions about natural sweeteners.  Nutrition diagnosis: Food and nutrition related knowledge deficit improved.  Intervention: Patient was educated to continue healthy, plant-based diet with adequate protein to promote healthy body weight. Questions were answered.  Teach back method used.  Monitoring, evaluation, goals: Patient will continue to tolerate a healthy, plant-based diet with adequate protein to minimize unintentional weight loss.  Next visit: Patient will contact me with questions or concerns.

## 2013-04-15 ENCOUNTER — Encounter: Payer: Self-pay | Admitting: Oncology

## 2013-04-15 ENCOUNTER — Other Ambulatory Visit: Payer: Self-pay | Admitting: *Deleted

## 2013-04-15 DIAGNOSIS — C259 Malignant neoplasm of pancreas, unspecified: Secondary | ICD-10-CM

## 2013-04-15 MED ORDER — CAPECITABINE 500 MG PO TABS: ORAL_TABLET | ORAL | Status: DC

## 2013-04-15 NOTE — Progress Notes (Signed)
Faxed xeloda prescription to [facility] °

## 2013-04-17 ENCOUNTER — Ambulatory Visit
Admission: RE | Admit: 2013-04-17 | Discharge: 2013-04-17 | Disposition: A | Discharge status: Home / Self Care | Payer: BC Managed Care – PPO | Source: Ambulatory Visit | Attending: Radiation Oncology | Admitting: Radiation Oncology

## 2013-04-17 DIAGNOSIS — C252 Malignant neoplasm of tail of pancreas: Secondary | ICD-10-CM

## 2013-04-17 DIAGNOSIS — C259 Malignant neoplasm of pancreas, unspecified: Secondary | ICD-10-CM | POA: Insufficient documentation

## 2013-04-17 DIAGNOSIS — Z510 Encounter for antineoplastic radiation therapy: Secondary | ICD-10-CM | POA: Insufficient documentation

## 2013-04-17 DIAGNOSIS — Z79899 Other long term (current) drug therapy: Secondary | ICD-10-CM | POA: Insufficient documentation

## 2013-04-17 DIAGNOSIS — R110 Nausea: Secondary | ICD-10-CM | POA: Insufficient documentation

## 2013-04-24 ENCOUNTER — Other Ambulatory Visit: Payer: Self-pay

## 2013-04-28 ENCOUNTER — Encounter: Payer: Self-pay | Admitting: *Deleted

## 2013-04-28 ENCOUNTER — Ambulatory Visit (HOSPITAL_BASED_OUTPATIENT_CLINIC_OR_DEPARTMENT_OTHER): Payer: BC Managed Care – PPO | Admitting: Nurse Practitioner

## 2013-04-28 ENCOUNTER — Telehealth: Payer: Self-pay | Admitting: *Deleted

## 2013-04-28 ENCOUNTER — Ambulatory Visit
Admission: RE | Admit: 2013-04-28 | Discharge: 2013-04-28 | Disposition: A | Discharge status: Home / Self Care | Payer: BC Managed Care – PPO | Source: Ambulatory Visit | Attending: Radiation Oncology | Admitting: Radiation Oncology

## 2013-04-28 ENCOUNTER — Other Ambulatory Visit (HOSPITAL_BASED_OUTPATIENT_CLINIC_OR_DEPARTMENT_OTHER): Payer: BC Managed Care – PPO

## 2013-04-28 VITALS — BP 141/83 | HR 68 | Temp 97.4°F | Resp 18 | Ht 71.0 in | Wt 205.2 lb

## 2013-04-28 DIAGNOSIS — N281 Cyst of kidney, acquired: Secondary | ICD-10-CM

## 2013-04-28 DIAGNOSIS — C252 Malignant neoplasm of tail of pancreas: Secondary | ICD-10-CM

## 2013-04-28 DIAGNOSIS — C259 Malignant neoplasm of pancreas, unspecified: Secondary | ICD-10-CM

## 2013-04-28 DIAGNOSIS — K8689 Other specified diseases of pancreas: Secondary | ICD-10-CM

## 2013-04-28 LAB — CBC WITH DIFFERENTIAL/PLATELET
Basophils Absolute: 0.1 10*3/uL (ref 0.0–0.1)
EOS%: 1.5 % (ref 0.0–7.0)
Eosinophils Absolute: 0.1 10*3/uL (ref 0.0–0.5)
HCT: 44.9 % (ref 38.4–49.9)
HGB: 14.9 g/dL (ref 13.0–17.1)
LYMPH%: 38.5 % (ref 14.0–49.0)
MONO#: 2.3 10*3/uL — ABNORMAL HIGH (ref 0.1–0.9)
MONO%: 27.8 % — ABNORMAL HIGH (ref 0.0–14.0)
NEUT#: 2.6 10*3/uL (ref 1.5–6.5)
NEUT%: 30.7 % — ABNORMAL LOW (ref 39.0–75.0)
Platelets: 641 10*3/uL — ABNORMAL HIGH (ref 140–400)
RBC: 4.91 10*6/uL (ref 4.20–5.82)
RDW: 15.5 % — ABNORMAL HIGH (ref 11.0–14.6)
WBC: 8.4 10*3/uL (ref 4.0–10.3)

## 2013-04-28 LAB — COMPREHENSIVE METABOLIC PANEL (CC13)
Albumin: 3.8 g/dL (ref 3.5–5.0)
Alkaline Phosphatase: 86 U/L (ref 40–150)
Anion Gap: 10 mEq/L (ref 3–11)
CO2: 24 mEq/L (ref 22–29)
Glucose: 102 mg/dl (ref 70–140)
Sodium: 139 mEq/L (ref 136–145)
Total Bilirubin: 0.21 mg/dL (ref 0.20–1.20)
Total Protein: 7.2 g/dL (ref 6.4–8.3)

## 2013-04-28 LAB — TECHNOLOGIST REVIEW

## 2013-04-28 NOTE — Telephone Encounter (Signed)
Called patient and informed him he has lab at 1145 am and will see Kaleen Odea, NP at 1215 and that she was informed that you had your first rad tx but not taking Xeloda as yet, she will discuss that with you at appt time,patient thanked me the call 10:08 AM

## 2013-04-28 NOTE — Telephone Encounter (Signed)
error 

## 2013-04-28 NOTE — Progress Notes (Signed)
OFFICE PROGRESS NOTE  Interval history:  Jon Pena returns for followup of pancreas cancer. He completed gemcitabine on 03/27/2013, 04/03/2013 and 04/10/2013. He began the course of radiation therapy today. He has not received the Xeloda.  He denies any nausea with the gemcitabine. He is feeling mildly nauseated today. No mouth sores. No diarrhea. Overall good oral intake. Energy level is "okay". He denies shortness of breath and cough. No fever. No skin rash. At present he has some nasal congestion.   Objective: Blood pressure 141/83, pulse 68, temperature 97.4 F (36.3 C), temperature source Oral, resp. rate 18, height 5\' 11"  (1.803 m), weight 205 lb 3.2 oz (93.078 kg).  Oropharynx is without thrush or ulceration. Lungs are clear. No wheezes or rales. Regular cardiac rhythm. Port-A-Cath site is without erythema. Abdomen is soft and nontender. No hepatomegaly. Extremities are without edema. Calves are nontender. No skin rash.  Lab Results: Lab Results  Component Value Date   WBC 8.4 04/28/2013   HGB 14.9 04/28/2013   HCT 44.9 04/28/2013   MCV 91.3 04/28/2013   PLT 641* 04/28/2013    Chemistry:    Chemistry      Component Value Date/Time   NA 139 04/28/2013 1135   NA 136 03/04/2013 1140   K 4.6 04/28/2013 1135   K 3.7 03/04/2013 1140   CL 96 03/04/2013 1140   CO2 24 04/28/2013 1135   CO2 29 03/04/2013 1140   BUN 18.3 04/28/2013 1135   BUN 10 03/04/2013 1140   CREATININE 1.0 04/28/2013 1135   CREATININE 0.82 03/04/2013 1140      Component Value Date/Time   CALCIUM 9.6 04/28/2013 1135   CALCIUM 9.1 03/04/2013 1140   ALKPHOS 86 04/28/2013 1135   ALKPHOS 72 02/25/2013 0842   AST 22 04/28/2013 1135   AST 17 02/25/2013 0842   ALT 31 04/28/2013 1135   ALT 22 02/25/2013 0842   BILITOT 0.21 04/28/2013 1135   BILITOT 0.1* 02/25/2013 0842       Studies/Results: No results found.  Medications: I have reviewed the patient's current medications.  Assessment/Plan:  1. Adenocarcinoma  of the tail of the pancreas, stage IIB (T3 N1) status post distal pancreatectomy/splenectomy on 02/27/2013. Adjuvant gemcitabine 03/27/2013, 04/03/2013 and 04/10/2013. Initiation of radiation 04/28/2013. 2. Left renal cyst with a mural nodular component on MRI 02/20/2013. Indeterminate. 3. Acute appendicitis status post laparoscopic appendectomy 01/26/2013. 4. Port-A-Cath placement 03/21/2013.  Disposition-he appears stable. He has not yet received Xeloda. We will contact managed care for followup. He will contact the office if he does not receive Xeloda in the next few days. We again reviewed potential toxicities associated with Xeloda including mouth sores, nausea, diarrhea, hand-foot syndrome, skin rash.  He will return for a followup visit on 05/19/2013. He understands to contact the office prior to that visit with any problems.   Plan reviewed with Dr. Derenda Fennel, Misty Stanley ANP/GNP-BC

## 2013-04-28 NOTE — Telephone Encounter (Signed)
Thelma Barge, prior authorization specialist called and stated Mr.Kornegay to get his Xeloda tomorrow,  per Dr.Moody said he could continue if she was going to start Xeloda this week,thanked Darl Pikes for the call, informed Miranda RT therapist on linac 4  Patient to continue his rad txs,pt to be called and informed 2:42 PM

## 2013-04-28 NOTE — Progress Notes (Signed)
RECEIVED A FAX FROM [facility] CONCERNING A CONFIRMATION OF [facility] RECEIPT FOR PT. REFERRAL. 

## 2013-04-28 NOTE — Telephone Encounter (Signed)
Called and notified Lonna Cobb, NP that patient had his 1st radiation treatment today, and then informed our therapist Faith that he hasn;'t received his Xeloda as yet,has has appt today with med onc,  9:05 AM

## 2013-04-29 ENCOUNTER — Telehealth: Payer: Self-pay | Admitting: Oncology

## 2013-04-29 ENCOUNTER — Ambulatory Visit
Admission: RE | Admit: 2013-04-29 | Discharge: 2013-04-29 | Disposition: A | Discharge status: Home / Self Care | Payer: BC Managed Care – PPO | Source: Ambulatory Visit | Attending: Radiation Oncology | Admitting: Radiation Oncology

## 2013-04-29 NOTE — Telephone Encounter (Signed)
S/w pt re appt for 11/24. Other appts remain the same.

## 2013-04-29 NOTE — Progress Notes (Signed)
Patient education done, radiation therapy and you book given with my business card,discussed skin irritation, fatigue, nausea,vomiting,diarrhea, diet, increase protein ,stay hydrated, may need to go to 5-6 small meals,, urinary changes, teach back given,aware he will get his Xeloda today 8:54 AM

## 2013-04-30 ENCOUNTER — Ambulatory Visit
Admission: RE | Admit: 2013-04-30 | Discharge: 2013-04-30 | Disposition: A | Discharge status: Home / Self Care | Payer: BC Managed Care – PPO | Source: Ambulatory Visit | Attending: Radiation Oncology | Admitting: Radiation Oncology

## 2013-05-01 ENCOUNTER — Telehealth: Payer: Self-pay | Admitting: *Deleted

## 2013-05-01 ENCOUNTER — Ambulatory Visit
Admission: RE | Admit: 2013-05-01 | Discharge: 2013-05-01 | Disposition: A | Discharge status: Home / Self Care | Payer: BC Managed Care – PPO | Source: Ambulatory Visit | Attending: Radiation Oncology | Admitting: Radiation Oncology

## 2013-05-01 NOTE — Telephone Encounter (Signed)
Received phone call from patient stating he had thrown up today and yesterday at 11 am. He denies any other complaints.   He stated he has not been taking his nausea medicine.  He stated he ate bacon this morning which may have contributed to nausea.  He stated he is drinking plenty of fluids and has only thrown up once per day.  This RN reminded patient to take his Compazine for nausea as prescribed and to call office if it does not work.  This RN reminded patient to call if he gets worse, has increased nausea/vomiting without relief, fever, pain, SOB, diarrhea, constipation, or other problems.  He verbalized comprehension.

## 2013-05-01 NOTE — Telephone Encounter (Signed)
Returned call to patient  Left earlier, he just now took compazine for nausea today, asked why he waited so long to take after reading Almira Coaster Dixon,RN note this am, he stated"I had to take my mom to get her hair cut and then for her eye exam, just got home a few minutes ago", sated he only had cream of wheat with honey and 2 eggs today for breakfast had too much chicken last night, had bacon the other day none today, suggested diet ginge rale,clear liquids, take compazine as directed for nausea, stay away from fried,greasy,citrus foods, may need to graze eating 6 smaller meals instead of 3 , will see MD tomorrow after treatment, patient said he would try and not eat too much today  But he eats all the time,  2:07 PM

## 2013-05-02 ENCOUNTER — Ambulatory Visit
Admission: RE | Admit: 2013-05-02 | Discharge: 2013-05-02 | Disposition: A | Discharge status: Home / Self Care | Payer: BC Managed Care – PPO | Source: Ambulatory Visit | Attending: Radiation Oncology | Admitting: Radiation Oncology

## 2013-05-02 ENCOUNTER — Encounter: Payer: Self-pay | Admitting: Radiation Oncology

## 2013-05-02 VITALS — BP 131/82 | HR 65 | Resp 16 | Wt 207.0 lb

## 2013-05-02 DIAGNOSIS — C252 Malignant neoplasm of tail of pancreas: Secondary | ICD-10-CM

## 2013-05-02 MED ORDER — ONDANSETRON HCL 8 MG PO TABS: 8.0000 mg | ORAL_TABLET | Freq: Three times a day (TID) | ORAL | Status: DC | PRN

## 2013-05-02 NOTE — Progress Notes (Signed)
Reports persistent nausea. Reports vomiting daily following treatment between 11-12. Reports taking compazine 30 minutes prior to taking chemo pill. Reports left upper quadrant abdominal discomfort. Reports that he begin taking chemo pill on Wednesday. Continues to hunt and denies fatigue.

## 2013-05-02 NOTE — Progress Notes (Signed)
   Department of Radiation Oncology  Phone:  [phone] Fax:        [phone]  Weekly Treatment Note    Name: CARTHEL CASTILLE Date: 05/02/2013 MRN: [id number] DOB: 01/21/1951   Current dose: 9 Gy  Current fraction: 5   MEDICATIONS: Current Outpatient Prescriptions  Medication Sig Dispense Refill  . acyclovir (ZOVIRAX) 800 MG tablet Take 800 mg by mouth 4 (four) times daily as needed (for outbreaks).      . Blood Glucose Monitoring Suppl (BLOOD GLUCOSE METER) kit Use as directed  1 each  0  . capecitabine (XELODA) 500 MG tablet Take #4 (2000 mg) in am and #3 (1500 mg) in pm =3500 mg total daily dose Take on days of radiation therapy only (M-F)  140 tablet  0  . famotidine (PEPCID) 20 MG tablet Take 20 mg by mouth as needed for heartburn.      . Glucose Blood (BLOOD GLUCOSE TEST STRIPS) STRP 1 strip by Subdermal route 2 (two) times daily.  60 each  0  . Lancets (ACCU-CHEK MULTICLIX) lancets Use as instructed  100 each  0  . lidocaine-prilocaine (EMLA) cream Apply topically as needed. Apply to [facility] site 1-2  Hours prior to stick and cover with plastic wrap  30 g  prn  . naproxen sodium (ANAPROX) 220 MG tablet Take 220 mg by mouth as needed. Takes 2 prn when he plays golf      . oxyCODONE-acetaminophen (PERCOCET/ROXICET) 5-325 MG per tablet Take 1-2 tablets by mouth every 4 (four) hours as needed.  30 tablet  0  . Polyethyl Glycol-Propyl Glycol (SYSTANE) 0.4-0.3 % SOLN Place 1 drop into both eyes daily as needed (for dry eyes).      . prochlorperazine (COMPAZINE) 10 MG tablet Take 1 tablet (10 mg total) by mouth every 6 (six) hours as needed (nausea).  60 tablet  1  . ondansetron (ZOFRAN) 8 MG tablet Take 1 tablet (8 mg total) by mouth every 8 (eight) hours as needed for nausea or vomiting.  30 tablet  0   No current facility-administered medications for this encounter.     ALLERGIES: Review of patient's allergies indicates no known allergies.   LABORATORY DATA:  Lab Results   Component Value Date   WBC 8.4 04/28/2013   HGB 14.9 04/28/2013   HCT 44.9 04/28/2013   MCV 91.3 04/28/2013   PLT 641* 04/28/2013   Lab Results  Component Value Date   NA 139 04/28/2013   K 4.6 04/28/2013   CL 96 03/04/2013   CO2 24 04/28/2013   Lab Results  Component Value Date   ALT 31 04/28/2013   AST 22 04/28/2013   ALKPHOS 86 04/28/2013   BILITOT 0.21 04/28/2013     NARRATIVE: Jon Pena was seen today for weekly treatment management. The chart was checked and the patient's films were reviewed. The patient has had some nausea. This has been present on most days. He has been taking Compazine with incomplete relief.  PHYSICAL EXAMINATION: weight is 207 lb (93.895 kg). His blood pressure is 131/82 and his pulse is 65. His respiration is 16.        ASSESSMENT: The patient is doing satisfactorily with treatment.  PLAN: We will continue with the patient's radiation treatment as planned. A prescription has been given for Zofran and the patient is aware that he can take both medications as necessary.

## 2013-05-05 ENCOUNTER — Ambulatory Visit
Admission: RE | Admit: 2013-05-05 | Discharge: 2013-05-05 | Disposition: A | Discharge status: Home / Self Care | Payer: BC Managed Care – PPO | Source: Ambulatory Visit | Attending: Radiation Oncology | Admitting: Radiation Oncology

## 2013-05-05 ENCOUNTER — Ambulatory Visit: Payer: BC Managed Care – PPO | Admitting: Nutrition

## 2013-05-05 NOTE — Progress Notes (Signed)
Patient requested brief followup.  Patient complains of daily vomiting.  He states he throws up every day around 11:00 in the morning.  He has been prescribed a second nausea medication which he began today.  He states he follows food recommendations to consume during nausea.  His weight has actually increased and was documented as 207 pounds, which is increased from 205 pounds October 16.  Nutrition diagnosis: Food and nutrition related knowledge deficit improved.  Intervention: Patient was educated to consume smaller amounts of bland foods and clear liquid until nausea has resolved.  He was educated to consume nausea medications as prescribed.  Patient verbalizes understanding.  Teach back method used.  Monitoring, evaluation, goals: Patient will tolerate adequate calories and protein to minimize weight loss and improve nutrition impact symptoms.  Next visit: Patient to call me for questions regarding nutrition.

## 2013-05-06 ENCOUNTER — Ambulatory Visit
Admission: RE | Admit: 2013-05-06 | Discharge: 2013-05-06 | Disposition: A | Discharge status: Home / Self Care | Payer: BC Managed Care – PPO | Source: Ambulatory Visit | Attending: Radiation Oncology | Admitting: Radiation Oncology

## 2013-05-07 ENCOUNTER — Ambulatory Visit
Admission: RE | Admit: 2013-05-07 | Discharge: 2013-05-07 | Disposition: A | Discharge status: Home / Self Care | Payer: BC Managed Care – PPO | Source: Ambulatory Visit | Attending: Radiation Oncology | Admitting: Radiation Oncology

## 2013-05-08 ENCOUNTER — Ambulatory Visit
Admission: RE | Admit: 2013-05-08 | Discharge: 2013-05-08 | Disposition: A | Discharge status: Home / Self Care | Payer: BC Managed Care – PPO | Source: Ambulatory Visit | Attending: Radiation Oncology | Admitting: Radiation Oncology

## 2013-05-09 ENCOUNTER — Ambulatory Visit
Admission: RE | Admit: 2013-05-09 | Discharge: 2013-05-09 | Disposition: A | Discharge status: Home / Self Care | Payer: BC Managed Care – PPO | Source: Ambulatory Visit | Attending: Radiation Oncology | Admitting: Radiation Oncology

## 2013-05-09 ENCOUNTER — Encounter: Payer: Self-pay | Admitting: Radiation Oncology

## 2013-05-09 VITALS — BP 111/78 | HR 58 | Temp 97.8°F | Resp 20 | Wt 205.8 lb

## 2013-05-09 DIAGNOSIS — C252 Malignant neoplasm of tail of pancreas: Secondary | ICD-10-CM

## 2013-05-09 NOTE — Progress Notes (Signed)
  Radiation Oncology         [phone] ________________________________  Name: Jon Pena MRN: [id number]  Date: 04/17/2013  DOB: 07-25-1950  SIMULATION AND TREATMENT PLANNING NOTE   CONSENT VERIFIED: yes   SET UP: Patient is set-up supine   IMMOBILIZATION: The following immobilization is used: alpha-cradle. This complex treatment device will be used on a daily basis during the patient's treatment.   Diagnosis: Pancreatic cancer   NARRATIVE: The patient was brought to the CT Simulation planning suite. Identity was confirmed. All relevant records and images related to the planned course of therapy were reviewed. Then, the patient was positioned in a stable reproducible clinical set-up for radiation therapy using a customized Vac-lock bag. Skin markings were placed. The CT images were loaded into the planning software where the target and avoidance structures were contoured.The radiation prescription was entered and confirmed.   The patient will receive 50.4 Gy in 28 fractions.   Daily image guidance is ordered, and this will be used on a daily basis. This is necessary to ensure accurate and precise localization of the target in addition to accurate alignment of the normal tissue structures in this region.   Treatment planning then occurred.   I have requested : Intensity Modulated Radiotherapy (IMRT) is medically necessary for this case for the following reason: Dose homogeneity; the target is in close proximity to critical normal structures, including the kidneys and the liver. IMRT is thus medically to appropriately treat the patient.   Special treatment procedure  The patient will receive chemotherapy during the course of radiation treatment. The patient may experience increased or overlapping toxicity due to this combined-modality approach and the patient will be monitored for such problems. This may include extra lab work as necessary. This therefore constitutes a special  treatment procedure.    ________________________________  Radene Gunning, MD, PhD

## 2013-05-09 NOTE — Progress Notes (Signed)
   Department of Radiation Oncology  Phone:  [phone] Fax:        [phone]  Weekly Treatment Note    Name: Jon Pena Date: 05/09/2013 MRN: [id number] DOB: Mar 24, 1951   Current dose: 18 Gy  Current fraction: 10   MEDICATIONS: Current Outpatient Prescriptions  Medication Sig Dispense Refill  . acyclovir (ZOVIRAX) 800 MG tablet Take 800 mg by mouth 4 (four) times daily as needed (for outbreaks).      . Blood Glucose Monitoring Suppl (BLOOD GLUCOSE METER) kit Use as directed  1 each  0  . capecitabine (XELODA) 500 MG tablet Take #4 (2000 mg) in am and #3 (1500 mg) in pm =3500 mg total daily dose Take on days of radiation therapy only (M-F)  140 tablet  0  . famotidine (PEPCID) 20 MG tablet Take 20 mg by mouth as needed for heartburn.      . Glucose Blood (BLOOD GLUCOSE TEST STRIPS) STRP 1 strip by Subdermal route 2 (two) times daily.  60 each  0  . Lancets (ACCU-CHEK MULTICLIX) lancets Use as instructed  100 each  0  . lidocaine-prilocaine (EMLA) cream Apply topically as needed. Apply to [facility] site 1-2  Hours prior to stick and cover with plastic wrap  30 g  prn  . naproxen sodium (ANAPROX) 220 MG tablet Take 220 mg by mouth as needed. Takes 2 prn when he plays golf      . ondansetron (ZOFRAN) 8 MG tablet Take 1 tablet (8 mg total) by mouth every 8 (eight) hours as needed for nausea or vomiting.  30 tablet  0  . oxyCODONE-acetaminophen (PERCOCET/ROXICET) 5-325 MG per tablet Take 1-2 tablets by mouth every 4 (four) hours as needed.  30 tablet  0  . Polyethyl Glycol-Propyl Glycol (SYSTANE) 0.4-0.3 % SOLN Place 1 drop into both eyes daily as needed (for dry eyes).      . prochlorperazine (COMPAZINE) 10 MG tablet Take 1 tablet (10 mg total) by mouth every 6 (six) hours as needed (nausea).  60 tablet  1   No current facility-administered medications for this encounter.     ALLERGIES: Review of patient's allergies indicates no known allergies.   LABORATORY DATA:  Lab  Results  Component Value Date   WBC 8.4 04/28/2013   HGB 14.9 04/28/2013   HCT 44.9 04/28/2013   MCV 91.3 04/28/2013   PLT 641* 04/28/2013   Lab Results  Component Value Date   NA 139 04/28/2013   K 4.6 04/28/2013   CL 96 03/04/2013   CO2 24 04/28/2013   Lab Results  Component Value Date   ALT 31 04/28/2013   AST 22 04/28/2013   ALKPHOS 86 04/28/2013   BILITOT 0.21 04/28/2013     NARRATIVE: Jon Pena was seen today for weekly treatment management. The chart was checked and the patient's films were reviewed. The patient is doing fairly well. Some fatigue. He denies any nausea and is taking Zofran twice a day.  PHYSICAL EXAMINATION: weight is 205 lb 12.8 oz (93.35 kg). His oral temperature is 97.8 F (36.6 C). His blood pressure is 111/78 and his pulse is 58. His respiration is 20.        ASSESSMENT: The patient is doing satisfactorily with treatment.  PLAN: We will continue with the patient's radiation treatment as planned.

## 2013-05-09 NOTE — Progress Notes (Signed)
Pt denies pain, loss of appetite, fatigue, nausea. He is taking Zofran twice daily to control nausea.

## 2013-05-12 ENCOUNTER — Encounter: Payer: Self-pay | Admitting: Radiation Oncology

## 2013-05-12 ENCOUNTER — Ambulatory Visit
Admission: RE | Admit: 2013-05-12 | Discharge: 2013-05-12 | Disposition: A | Discharge status: Home / Self Care | Payer: BC Managed Care – PPO | Source: Ambulatory Visit | Attending: Radiation Oncology | Admitting: Radiation Oncology

## 2013-05-12 ENCOUNTER — Other Ambulatory Visit (HOSPITAL_BASED_OUTPATIENT_CLINIC_OR_DEPARTMENT_OTHER): Payer: BC Managed Care – PPO | Admitting: Lab

## 2013-05-12 DIAGNOSIS — C259 Malignant neoplasm of pancreas, unspecified: Secondary | ICD-10-CM

## 2013-05-12 DIAGNOSIS — C252 Malignant neoplasm of tail of pancreas: Secondary | ICD-10-CM

## 2013-05-12 LAB — CBC WITH DIFFERENTIAL/PLATELET
BASO%: 0.5 % (ref 0.0–2.0)
EOS%: 4.8 % (ref 0.0–7.0)
HGB: 15.1 g/dL (ref 13.0–17.1)
MCH: 30.3 pg (ref 27.2–33.4)
MCHC: 33.5 g/dL (ref 32.0–36.0)
Platelets: 278 10*3/uL (ref 140–400)
RBC: 4.98 10*6/uL (ref 4.20–5.82)
RDW: 15.2 % — ABNORMAL HIGH (ref 11.0–14.6)
lymph#: 0.8 10*3/uL — ABNORMAL LOW (ref 0.9–3.3)

## 2013-05-12 NOTE — Progress Notes (Signed)
Weekly rad txs abd 10 completed, still has nausea ,takes meds which helps no other changes

## 2013-05-13 ENCOUNTER — Ambulatory Visit
Admission: RE | Admit: 2013-05-13 | Discharge: 2013-05-13 | Disposition: A | Discharge status: Home / Self Care | Payer: BC Managed Care – PPO | Source: Ambulatory Visit | Attending: Radiation Oncology | Admitting: Radiation Oncology

## 2013-05-14 ENCOUNTER — Ambulatory Visit
Admission: RE | Admit: 2013-05-14 | Discharge: 2013-05-14 | Disposition: A | Discharge status: Home / Self Care | Payer: BC Managed Care – PPO | Source: Ambulatory Visit | Attending: Radiation Oncology | Admitting: Radiation Oncology

## 2013-05-19 ENCOUNTER — Other Ambulatory Visit: Payer: BC Managed Care – PPO | Admitting: Lab

## 2013-05-19 ENCOUNTER — Ambulatory Visit: Payer: BC Managed Care – PPO | Admitting: Oncology

## 2013-05-19 ENCOUNTER — Ambulatory Visit
Admission: RE | Admit: 2013-05-19 | Discharge: 2013-05-19 | Disposition: A | Discharge status: Home / Self Care | Payer: BC Managed Care – PPO | Source: Ambulatory Visit | Attending: Radiation Oncology | Admitting: Radiation Oncology

## 2013-05-19 ENCOUNTER — Encounter: Payer: Self-pay | Admitting: Radiation Oncology

## 2013-05-19 VITALS — BP 126/86 | HR 56 | Resp 18 | Wt 203.0 lb

## 2013-05-19 DIAGNOSIS — C252 Malignant neoplasm of tail of pancreas: Secondary | ICD-10-CM

## 2013-05-19 NOTE — Progress Notes (Signed)
   Department of Radiation Oncology  Phone:  [phone] Fax:        [phone]  Weekly Treatment Note    Name: Jon Pena Date: 05/19/2013 MRN: [id number] DOB: 1951-05-29   Current dose: 25.2 Gy  Current fraction: 14   MEDICATIONS: Current Outpatient Prescriptions  Medication Sig Dispense Refill  . acyclovir (ZOVIRAX) 800 MG tablet Take 800 mg by mouth 4 (four) times daily as needed (for outbreaks).      . Blood Glucose Monitoring Suppl (BLOOD GLUCOSE METER) kit Use as directed  1 each  0  . capecitabine (XELODA) 500 MG tablet Take #4 (2000 mg) in am and #3 (1500 mg) in pm =3500 mg total daily dose Take on days of radiation therapy only (M-F)  140 tablet  0  . famotidine (PEPCID) 20 MG tablet Take 20 mg by mouth as needed for heartburn.      . Glucose Blood (BLOOD GLUCOSE TEST STRIPS) STRP 1 strip by Subdermal route 2 (two) times daily.  60 each  0  . Lancets (ACCU-CHEK MULTICLIX) lancets Use as instructed  100 each  0  . lidocaine-prilocaine (EMLA) cream Apply topically as needed. Apply to [facility] site 1-2  Hours prior to stick and cover with plastic wrap  30 g  prn  . naproxen sodium (ANAPROX) 220 MG tablet Take 220 mg by mouth as needed. Takes 2 prn when he plays golf      . ondansetron (ZOFRAN) 8 MG tablet Take 1 tablet (8 mg total) by mouth every 8 (eight) hours as needed for nausea or vomiting.  30 tablet  0  . oxyCODONE-acetaminophen (PERCOCET/ROXICET) 5-325 MG per tablet Take 1-2 tablets by mouth every 4 (four) hours as needed.  30 tablet  0  . Polyethyl Glycol-Propyl Glycol (SYSTANE) 0.4-0.3 % SOLN Place 1 drop into both eyes daily as needed (for dry eyes).      . prochlorperazine (COMPAZINE) 10 MG tablet Take 1 tablet (10 mg total) by mouth every 6 (six) hours as needed (nausea).  60 tablet  1   No current facility-administered medications for this encounter.     ALLERGIES: Review of patient's allergies indicates no known allergies.   LABORATORY DATA:  Lab  Results  Component Value Date   WBC 8.7 05/12/2013   HGB 15.1 05/12/2013   HCT 45.0 05/12/2013   MCV 90.4 05/12/2013   PLT 278 05/12/2013   Lab Results  Component Value Date   NA 139 04/28/2013   K 4.6 04/28/2013   CL 96 03/04/2013   CO2 24 04/28/2013   Lab Results  Component Value Date   ALT 31 04/28/2013   AST 22 04/28/2013   ALKPHOS 86 04/28/2013   BILITOT 0.21 04/28/2013     NARRATIVE: Jon Pena was seen today for weekly treatment management. The chart was checked and the patient's films were reviewed. The patient overall has been doing satisfactorily. He is having some nausea but this is controlled with his current medication. He needs a refill and he will call Jon Pena back with the specifics of this later today.  PHYSICAL EXAMINATION: weight is 203 lb (92.08 kg). His blood pressure is 126/86 and his pulse is 56. His respiration is 18.        ASSESSMENT: The patient is doing satisfactorily with treatment.  PLAN: We will continue with the patient's radiation treatment as planned.

## 2013-05-19 NOTE — Progress Notes (Signed)
Reports persistent nausea. Reports vomiting daily following treatment between 11-12 despite taking zofran and compazine. Reports intermittent left upper quadrant pain. Reports mild fatigue.

## 2013-05-20 ENCOUNTER — Other Ambulatory Visit: Payer: Self-pay | Admitting: Radiation Oncology

## 2013-05-20 ENCOUNTER — Ambulatory Visit
Admission: RE | Admit: 2013-05-20 | Discharge: 2013-05-20 | Disposition: A | Discharge status: Home / Self Care | Payer: BC Managed Care – PPO | Source: Ambulatory Visit | Attending: Radiation Oncology | Admitting: Radiation Oncology

## 2013-05-20 ENCOUNTER — Telehealth: Payer: Self-pay | Admitting: *Deleted

## 2013-05-20 MED ORDER — ONDANSETRON HCL 8 MG PO TABS: 8.0000 mg | ORAL_TABLET | Freq: Three times a day (TID) | ORAL | Status: DC | PRN

## 2013-05-20 NOTE — Telephone Encounter (Signed)
Left message requesting pt contact office to reschedule missed appt. Received notification from [facility] pharmacy that pt reports moderate nausea. Noted RadOnc MD prescribed additional antiemetic.

## 2013-05-20 NOTE — Telephone Encounter (Signed)
Called pat home and cell phone, left messages on both Zofran rx e-scribed to your pharmacy this am  By Dr.moody , if any questions please call me 10:04 AM

## 2013-05-21 ENCOUNTER — Ambulatory Visit
Admission: RE | Admit: 2013-05-21 | Discharge: 2013-05-21 | Disposition: A | Discharge status: Home / Self Care | Payer: BC Managed Care – PPO | Source: Ambulatory Visit | Attending: Radiation Oncology | Admitting: Radiation Oncology

## 2013-05-21 ENCOUNTER — Telehealth: Payer: Self-pay | Admitting: Oncology

## 2013-05-21 NOTE — Telephone Encounter (Signed)
Talked to pt and gave him appt for 05/26/13 MD

## 2013-05-22 ENCOUNTER — Other Ambulatory Visit: Payer: Self-pay | Admitting: *Deleted

## 2013-05-22 ENCOUNTER — Ambulatory Visit
Admission: RE | Admit: 2013-05-22 | Discharge: 2013-05-22 | Disposition: A | Discharge status: Home / Self Care | Payer: BC Managed Care – PPO | Source: Ambulatory Visit | Attending: Radiation Oncology | Admitting: Radiation Oncology

## 2013-05-22 DIAGNOSIS — C252 Malignant neoplasm of tail of pancreas: Secondary | ICD-10-CM

## 2013-05-22 NOTE — Telephone Encounter (Signed)
THIS REFILL REQUEST FOR CAPECITABINE WAS GIVEN TO DR.SHERRILL'S NURSE, TANYA WHITLOCK,RN. 

## 2013-05-22 NOTE — Progress Notes (Signed)
   Department of Radiation Oncology  Phone:  [phone] Fax:        [phone]  Weekly Treatment Note    Name: Jon Pena Date: 05/22/2013 MRN: [id number] DOB: [birth year]   Current dose: 30.6 Gy  Current fraction: 17   MEDICATIONS: Current Outpatient Prescriptions  Medication Sig Dispense Refill  . acyclovir (ZOVIRAX) 800 MG tablet Take 800 mg by mouth 4 (four) times daily as needed (for outbreaks).      . Blood Glucose Monitoring Suppl (BLOOD GLUCOSE METER) kit Use as directed  1 each  0  . capecitabine (XELODA) 500 MG tablet Take #4 (2000 mg) in am and #3 (1500 mg) in pm =3500 mg total daily dose Take on days of radiation therapy only (M-F)  140 tablet  0  . famotidine (PEPCID) 20 MG tablet Take 20 mg by mouth as needed for heartburn.      . Glucose Blood (BLOOD GLUCOSE TEST STRIPS) STRP 1 strip by Subdermal route 2 (two) times daily.  60 each  0  . Lancets (ACCU-CHEK MULTICLIX) lancets Use as instructed  100 each  0  . lidocaine-prilocaine (EMLA) cream Apply topically as needed. Apply to [facility] site 1-2  Hours prior to stick and cover with plastic wrap  30 g  prn  . naproxen sodium (ANAPROX) 220 MG tablet Take 220 mg by mouth as needed. Takes 2 prn when he plays golf      . ondansetron (ZOFRAN) 8 MG tablet Take 1 tablet (8 mg total) by mouth every 8 (eight) hours as needed for nausea or vomiting.  30 tablet  0  . Polyethyl Glycol-Propyl Glycol (SYSTANE) 0.4-0.3 % SOLN Place 1 drop into both eyes daily as needed (for dry eyes).      Marland Kitchen oxyCODONE-acetaminophen (PERCOCET/ROXICET) 5-325 MG per tablet Take 1-2 tablets by mouth every 4 (four) hours as needed.  30 tablet  0  . prochlorperazine (COMPAZINE) 10 MG tablet Take 1 tablet (10 mg total) by mouth every 6 (six) hours as needed (nausea).  60 tablet  1   No current facility-administered medications for this encounter.     ALLERGIES: Review of patient's allergies indicates no known allergies.   LABORATORY DATA:  Lab  Results  Component Value Date   WBC 8.7 05/12/2013   HGB 15.1 05/12/2013   HCT 45.0 05/12/2013   MCV 90.4 05/12/2013   PLT 278 05/12/2013   Lab Results  Component Value Date   NA 139 04/28/2013   K 4.6 04/28/2013   CL 96 03/04/2013   CO2 24 04/28/2013   Lab Results  Component Value Date   ALT 31 04/28/2013   AST 22 04/28/2013   ALKPHOS 86 04/28/2013   BILITOT 0.21 04/28/2013     NARRATIVE: DERRICO ZHONG was seen today for weekly treatment management. The chart was checked and the patient's films were reviewed. The patient still is having some nausea. No major change. The patient appears comfortable with his current nausea medicine scan at this point. He is taking Zofran as well as Compazine.  PHYSICAL EXAMINATION:   weight 201.2 pounds blood pressure 152/93 pulse 63 temperature 98.2  ASSESSMENT: The patient is doing satisfactorily with treatment.  PLAN: We will continue with the patient's radiation treatment as planned.

## 2013-05-22 NOTE — Progress Notes (Signed)
Weekly rad txs, 17 completed abd, threw up this am, , forgot to take his meds last night did take his zofran this am, still a little nauseated, has mucossitis thick saliva also in his mouth stated, fatigued, did eat cream of wheat, coffee,  2 glasses water 8:48 AM

## 2013-05-23 ENCOUNTER — Ambulatory Visit: Admission: RE | Admit: 2013-05-23 | Payer: BC Managed Care – PPO | Source: Ambulatory Visit

## 2013-05-26 ENCOUNTER — Other Ambulatory Visit: Payer: Self-pay | Admitting: *Deleted

## 2013-05-26 ENCOUNTER — Ambulatory Visit (HOSPITAL_BASED_OUTPATIENT_CLINIC_OR_DEPARTMENT_OTHER): Payer: BC Managed Care – PPO

## 2013-05-26 ENCOUNTER — Ambulatory Visit: Payer: BC Managed Care – PPO

## 2013-05-26 ENCOUNTER — Ambulatory Visit (HOSPITAL_BASED_OUTPATIENT_CLINIC_OR_DEPARTMENT_OTHER): Payer: BC Managed Care – PPO | Admitting: Oncology

## 2013-05-26 ENCOUNTER — Telehealth: Payer: Self-pay | Admitting: *Deleted

## 2013-05-26 ENCOUNTER — Telehealth: Payer: Self-pay | Admitting: Oncology

## 2013-05-26 VITALS — BP 127/76 | HR 68 | Temp 97.3°F | Resp 18

## 2013-05-26 VITALS — BP 106/69 | HR 66 | Temp 97.0°F | Resp 20 | Ht 71.0 in | Wt 196.5 lb

## 2013-05-26 DIAGNOSIS — R110 Nausea: Secondary | ICD-10-CM

## 2013-05-26 DIAGNOSIS — C259 Malignant neoplasm of pancreas, unspecified: Secondary | ICD-10-CM

## 2013-05-26 DIAGNOSIS — C252 Malignant neoplasm of tail of pancreas: Secondary | ICD-10-CM

## 2013-05-26 MED ORDER — SODIUM CHLORIDE 0.9 % IJ SOLN
10.0000 mL | INTRAMUSCULAR | Status: DC | PRN
  Administered 2013-05-26: 10 mL via INTRAVENOUS
  Filled 2013-05-26: qty 10

## 2013-05-26 MED ORDER — HEPARIN SOD (PORK) LOCK FLUSH 100 UNIT/ML IV SOLN
500.0000 [IU] | Freq: Once | INTRAVENOUS | Status: AC
  Administered 2013-05-26: 500 [IU] via INTRAVENOUS
  Filled 2013-05-26: qty 5

## 2013-05-26 NOTE — Telephone Encounter (Signed)
Refill request for Capecitabine received-noted at visit today patient has decided to stop radiation and xeloda. Will continue Gemzar. Faxed script to [facility] that Xeloda has been discontinued.

## 2013-05-26 NOTE — Telephone Encounter (Signed)
Per staff message and POF I have scheduled appts.  JMW  

## 2013-05-26 NOTE — Progress Notes (Signed)
   [facility]    OFFICE PROGRESS NOTE   INTERVAL HISTORY:   Mr. Fahs returns for followup of pancreas cancer. He decided to discontinue capecitabine and radiation as of 05/22/2013. He reports nausea and vomiting over the past few weeks. He denies mouth sores and diarrhea. He has developed dry skin over the hands and feet. He feels better today.  Objective:  Vital signs in last 24 hours:  Blood pressure 106/69, pulse 66, temperature 97 F (36.1 C), temperature source Oral, resp. rate 20, height 5\' 11"  (1.803 m), weight 196 lb 8 oz (89.132 kg).    HEENT: No thrush or ulcers Resp: Lungs clear bilaterally Cardio: Regular rate and rhythm GI: No hepatomegaly, no mass, nontender Vascular: No leg edema  Skin: Mild erythema with dryness over the palms, mild dry desquamation at the soles   Portacath/PICC-without erythema  Lab Results:  Lab Results  Component Value Date   WBC 8.7 05/12/2013   HGB 15.1 05/12/2013   HCT 45.0 05/12/2013   MCV 90.4 05/12/2013   PLT 278 05/12/2013      Medications: I have reviewed the patient's current medications.  Assessment/Plan: 1. 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 Adjuvant gemcitabine 03/27/2013, 04/03/2013 and 04/10/2013.  Initiation of radiation and capecitabine 04/28/2013. Discontinued 05/22/2013 secondary to nausea. 2. Left renal cyst with a mural nodular component on MRI 02/20/2013. Indeterminate. 3. Acute appendicitis status post laparoscopic appendectomy 01/26/2013. 4. Port-A-Cath placement 03/21/2013.    Disposition:  Mr. Trudel has completed a partial course of capecitabine and radiation. He has decided against further radiation secondary to nausea. He was last treated on 05/22/2013.  Mr. Mendonsa agrees to continue with the planned adjuvant gemcitabine chemotherapy. He would like to wait until after Christmas. The  Port-A-Cath was flushed today. He will return for an office visit and gemcitabine on 06/17/2013.  He will contact us if the nausea is not improved over the next few days.   Thornton Papas, MD  05/26/2013  9:45 AM

## 2013-05-26 NOTE — Telephone Encounter (Signed)
Gave pt appt for lab,md and flush for December and January 2015 °

## 2013-05-27 ENCOUNTER — Ambulatory Visit: Payer: BC Managed Care – PPO

## 2013-05-27 NOTE — Progress Notes (Signed)
No Show

## 2013-05-28 ENCOUNTER — Ambulatory Visit: Payer: BC Managed Care – PPO

## 2013-05-28 ENCOUNTER — Telehealth: Payer: Self-pay | Admitting: Oncology

## 2013-05-28 NOTE — Telephone Encounter (Signed)
Gave pt appt for lab,md and chemo for december and January 2015 °

## 2013-05-29 ENCOUNTER — Ambulatory Visit: Payer: BC Managed Care – PPO

## 2013-05-30 ENCOUNTER — Ambulatory Visit: Payer: BC Managed Care – PPO

## 2013-05-30 ENCOUNTER — Encounter: Payer: Self-pay | Admitting: Radiation Oncology

## 2013-05-30 NOTE — Progress Notes (Signed)
  Radiation Oncology         [phone] ________________________________  Name: Jon Pena MRN: [id number]  Date: 05/30/2013  DOB: 06/27/1950  End of Treatment Note  Diagnosis:   Pancreatic cancer     Indication for treatment:  Curative       Radiation treatment dates:   04/28/2013 through 05/22/2013  Site/dose:   The patient was treated to the postoperative region within the abdomen. He was planned to receive 50.4 gray at 1.8 gray per fraction. The patient's treatment was designed using IMRT with daily image guidance. The patient decided to discontinue his treatment after having received 30.6 gray.  Narrative: The patient tolerated radiation treatment relatively well.   The patient noted some fatigue. The patient as noted above decided to discontinue his treatment during his course of radiation after 30.6 gray. He stated that he stopped his treatment"because my pancreas is telling me to stop." The patient did not have significant specific difficulties with nausea, pain, or other issues which can be addressed. Rather, this was more of a general impression that the patient felt that he had had enough and his body did not really tolerate well further treatment. I discussed this with him on a couple of occasions and he eventually made his final decision."    Plan: The patient has completed radiation treatment. The patient will return to radiation oncology clinic for routine followup in one month. I advised the patient to call or return sooner if they have any questions or concerns related to their recovery or treatment. ________________________________  Radene Gunning, M.D., Ph.D.

## 2013-06-02 ENCOUNTER — Ambulatory Visit: Payer: BC Managed Care – PPO

## 2013-06-03 ENCOUNTER — Ambulatory Visit: Payer: BC Managed Care – PPO

## 2013-06-04 ENCOUNTER — Ambulatory Visit: Payer: BC Managed Care – PPO

## 2013-06-05 ENCOUNTER — Ambulatory Visit: Payer: BC Managed Care – PPO

## 2013-06-06 ENCOUNTER — Ambulatory Visit: Payer: BC Managed Care – PPO

## 2013-06-09 ENCOUNTER — Ambulatory Visit: Payer: BC Managed Care – PPO

## 2013-06-10 ENCOUNTER — Other Ambulatory Visit: Payer: Self-pay | Admitting: *Deleted

## 2013-06-15 ENCOUNTER — Other Ambulatory Visit: Payer: Self-pay | Admitting: Oncology

## 2013-06-17 ENCOUNTER — Other Ambulatory Visit (HOSPITAL_BASED_OUTPATIENT_CLINIC_OR_DEPARTMENT_OTHER): Payer: BC Managed Care – PPO

## 2013-06-17 ENCOUNTER — Ambulatory Visit (HOSPITAL_BASED_OUTPATIENT_CLINIC_OR_DEPARTMENT_OTHER): Payer: BC Managed Care – PPO

## 2013-06-17 ENCOUNTER — Telehealth: Payer: Self-pay | Admitting: Oncology

## 2013-06-17 ENCOUNTER — Ambulatory Visit (HOSPITAL_BASED_OUTPATIENT_CLINIC_OR_DEPARTMENT_OTHER): Payer: BC Managed Care – PPO | Admitting: Nurse Practitioner

## 2013-06-17 VITALS — BP 126/75 | HR 59 | Temp 97.0°F | Resp 18 | Ht 71.0 in | Wt 201.5 lb

## 2013-06-17 DIAGNOSIS — C252 Malignant neoplasm of tail of pancreas: Secondary | ICD-10-CM

## 2013-06-17 DIAGNOSIS — C259 Malignant neoplasm of pancreas, unspecified: Secondary | ICD-10-CM

## 2013-06-17 DIAGNOSIS — Z5111 Encounter for antineoplastic chemotherapy: Secondary | ICD-10-CM

## 2013-06-17 DIAGNOSIS — C801 Malignant (primary) neoplasm, unspecified: Secondary | ICD-10-CM

## 2013-06-17 DIAGNOSIS — Q619 Cystic kidney disease, unspecified: Secondary | ICD-10-CM

## 2013-06-17 LAB — COMPREHENSIVE METABOLIC PANEL (CC13)
AST: 21 U/L (ref 5–34)
Albumin: 3.6 g/dL (ref 3.5–5.0)
BUN: 22 mg/dL (ref 7.0–26.0)
CO2: 25 mEq/L (ref 22–29)
Calcium: 9 mg/dL (ref 8.4–10.4)
Chloride: 102 mEq/L (ref 98–109)
Creatinine: 1 mg/dL (ref 0.7–1.3)
Glucose: 133 mg/dl (ref 70–140)
Potassium: 4.5 mEq/L (ref 3.5–5.1)
Total Protein: 7.2 g/dL (ref 6.4–8.3)

## 2013-06-17 LAB — CBC WITH DIFFERENTIAL/PLATELET
Basophils Absolute: 0 10*3/uL (ref 0.0–0.1)
Eosinophils Absolute: 0.4 10*3/uL (ref 0.0–0.5)
HGB: 15.6 g/dL (ref 13.0–17.1)
MONO#: 1 10*3/uL — ABNORMAL HIGH (ref 0.1–0.9)
NEUT#: 4.1 10*3/uL (ref 1.5–6.5)
NEUT%: 59.3 % (ref 39.0–75.0)
RDW: 17 % — ABNORMAL HIGH (ref 11.0–14.6)
WBC: 6.9 10*3/uL (ref 4.0–10.3)
lymph#: 1.4 10*3/uL (ref 0.9–3.3)

## 2013-06-17 MED ORDER — SODIUM CHLORIDE 0.9 % IV SOLN
1000.0000 mg/m2 | Freq: Once | INTRAVENOUS | Status: AC
  Administered 2013-06-17: 2128 mg via INTRAVENOUS
  Filled 2013-06-17: qty 55.97

## 2013-06-17 MED ORDER — SODIUM CHLORIDE 0.9 % IV SOLN
Freq: Once | INTRAVENOUS | Status: AC
  Administered 2013-06-17: 11:00:00 via INTRAVENOUS

## 2013-06-17 MED ORDER — HEPARIN SOD (PORK) LOCK FLUSH 100 UNIT/ML IV SOLN
500.0000 [IU] | Freq: Once | INTRAVENOUS | Status: AC | PRN
  Administered 2013-06-17: 500 [IU]
  Filled 2013-06-17: qty 5

## 2013-06-17 MED ORDER — SODIUM CHLORIDE 0.9 % IJ SOLN
10.0000 mL | INTRAMUSCULAR | Status: DC | PRN
  Administered 2013-06-17: 10 mL
  Filled 2013-06-17: qty 10

## 2013-06-17 MED ORDER — PROCHLORPERAZINE MALEATE 10 MG PO TABS
ORAL_TABLET | ORAL | Status: AC
  Filled 2013-06-17: qty 1

## 2013-06-17 MED ORDER — PROCHLORPERAZINE MALEATE 10 MG PO TABS
10.0000 mg | ORAL_TABLET | Freq: Once | ORAL | Status: AC
  Administered 2013-06-17: 10 mg via ORAL

## 2013-06-17 NOTE — Telephone Encounter (Signed)
gv and printed appt sched and avs for pt for Jan and Feb.....sed added tx. °

## 2013-06-17 NOTE — Patient Instructions (Signed)
[facility] Discharge Instructions for Patients Receiving Chemotherapy  Today you received the following chemotherapy agents Gemzar.  To help prevent nausea and vomiting after your treatment, we encourage you to take your nausea medication as prescribed.   If you develop nausea and vomiting that is not controlled by your nausea medication, call the clinic.   BELOW ARE SYMPTOMS THAT SHOULD BE REPORTED IMMEDIATELY:  *FEVER GREATER THAN 100.5 F  *CHILLS WITH OR WITHOUT FEVER  NAUSEA AND VOMITING THAT IS NOT CONTROLLED WITH YOUR NAUSEA MEDICATION  *UNUSUAL SHORTNESS OF BREATH  *UNUSUAL BRUISING OR BLEEDING  TENDERNESS IN MOUTH AND THROAT WITH OR WITHOUT PRESENCE OF ULCERS  *URINARY PROBLEMS  *BOWEL PROBLEMS  UNUSUAL RASH Items with * indicate a potential emergency and should be followed up as soon as possible.  Feel free to call the clinic you have any questions or concerns. The clinic phone number is [phone].    

## 2013-06-17 NOTE — Progress Notes (Signed)
OFFICE PROGRESS NOTE  Interval history:  Jon Pena returns as scheduled. Overall he is feeling well. The nausea has resolved. He denies diarrhea. No mouth sores. He occasionally notes gum bleeding when he brushes his teeth. He reports a good appetite. He is concerned about a "sore" on his back that has been present for about one month.   Objective: Filed Vitals:   06/17/13 0941  BP: 126/75  Pulse: 59  Temp: 97 F (36.1 C)  Resp: 18     No thrush or ulcerations. Lungs are clear. Regular cardiac rhythm. Port-A-Cath site without erythema. Abdomen soft and nontender. No hepatomegaly. No leg edema. Calves nontender. At the right mid to low back there is an approximate 1 cm sebaceous cyst that appears mildly inflamed.  Lab Results: Lab Results  Component Value Date   WBC 6.9 06/17/2013   HGB 15.6 06/17/2013   HCT 45.5 06/17/2013   MCV 91.6 06/17/2013   PLT 229 06/17/2013   NEUTROABS 4.1 06/17/2013    Chemistry:    Chemistry      Component Value Date/Time   NA 136 06/17/2013 0927   NA 136 03/04/2013 1140   K 4.5 06/17/2013 0927   K 3.7 03/04/2013 1140   CL 96 03/04/2013 1140   CO2 25 06/17/2013 0927   CO2 29 03/04/2013 1140   BUN 22.0 06/17/2013 0927   BUN 10 03/04/2013 1140   CREATININE 1.0 06/17/2013 0927   CREATININE 0.82 03/04/2013 1140      Component Value Date/Time   CALCIUM 9.0 06/17/2013 0927   CALCIUM 9.1 03/04/2013 1140   ALKPHOS 81 06/17/2013 0927   ALKPHOS 72 02/25/2013 0842   AST 21 06/17/2013 0927   AST 17 02/25/2013 0842   ALT 25 06/17/2013 0927   ALT 22 02/25/2013 0842   BILITOT 0.32 06/17/2013 0927   BILITOT 0.1* 02/25/2013 0842       Studies/Results: No results found.  Medications: I have reviewed the patient's current medications.  Assessment/Plan: 1. 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 Adjuvant gemcitabine 03/27/2013, 04/03/2013 and 04/10/2013.  Initiation of  radiation and capecitabine 04/28/2013. Discontinued 05/22/2013 secondary to nausea. Adjuvant gemcitabine resumed 06/17/2013.  2. Left renal cyst with a mural nodular component on MRI 02/20/2013. Indeterminate. 3. Acute appendicitis status post laparoscopic appendectomy 01/26/2013. 4. Port-A-Cath placement 03/21/2013. 5. Right mid to low back sebaceous cyst. Appears mildly inflamed. He will followup with his dermatologist.    Dispositon-he appears stable. Plan to proceed with adjuvant gemcitabine today as scheduled (weekly x3 followed by a one-week break). He will return for a followup visit on 07/01/2013. He will contact the office in the interim with any problems.  Plan reviewed with Dr. Truett Perna.   Lonna Cobb ANP/GNP-BC

## 2013-06-19 ENCOUNTER — Other Ambulatory Visit: Payer: Self-pay | Admitting: Oncology

## 2013-06-20 ENCOUNTER — Ambulatory Visit (INDEPENDENT_AMBULATORY_CARE_PROVIDER_SITE_OTHER): Payer: BC Managed Care – PPO | Admitting: General Surgery

## 2013-06-20 ENCOUNTER — Encounter (INDEPENDENT_AMBULATORY_CARE_PROVIDER_SITE_OTHER): Payer: Self-pay | Admitting: General Surgery

## 2013-06-20 VITALS — BP 118/70 | HR 68 | Temp 98.4°F | Resp 14 | Ht 71.0 in | Wt 195.4 lb

## 2013-06-20 DIAGNOSIS — C252 Malignant neoplasm of tail of pancreas: Secondary | ICD-10-CM

## 2013-06-20 NOTE — Progress Notes (Signed)
HISTORY: Patient is approximately 4 months status post distal pancreatectomy and splenectomy for pancreatic cancer. He was receiving Xeloda and radiation. He did not tolerate this well. He is back on gemcitabine.  He is anxious to get his Port-A-Cath removed in order to play golf once he is done with chemotherapy. He does have occasional issues with his blood sugars. He is able to correlate this with eating too much sugar. He has some occasional soreness of his midline incision, but this is not enough to cause him too much discomfort.   PERTINENT REVIEW OF SYSTEMS: Otherwise negative x 11.  Filed Vitals:   06/20/13 1109  BP: 118/70  Pulse: 68  Temp: 98.4 F (36.9 C)  Resp: 14   Filed Weights   06/20/13 1109  Weight: 195 lb 6.4 oz (88.633 kg)    EXAM: Head: Normocephalic and atraumatic.  Eyes:  Conjunctivae are normal. Pupils are equal, round, and reactive to light. No scleral icterus.  Neck:  Normal range of motion. Neck supple. No tracheal deviation present. No thyromegaly present.  Resp: No respiratory distress, normal effort. Abd:  Abdomen is soft, non distended and non tender. No masses are palpable.  There is no rebound and no guarding. No hernia at incision.   Neurological: Alert and oriented to person, place, and time. Coordination normal.  Skin: Skin is warm and dry. No rash noted. No diaphoretic. No erythema. No pallor.  Psychiatric: Normal mood and affect. Normal behavior. Judgment and thought content normal.      ASSESSMENT AND PLAN:   Malignant neoplasm of tail of pancreas Patient will continue gemcitabine chemotherapy. He would like his Port-A-Cath out as soon as possible in order to play golf. I will see him back in 3 months. I will also send a message to Dr. Benay Spice regarding when his imaging will be post chemotherapy.      Milus Height, MD Surgical Oncology, [facility]  Leonides Grills, MD Elsie Lincoln, MD

## 2013-06-20 NOTE — Assessment & Plan Note (Signed)
Patient will continue gemcitabine chemotherapy. He would like his Port-A-Cath out as soon as possible in order to play golf. I will see him back in 3 months. I will also send a message to Dr. Benay Spice regarding when his imaging will be post chemotherapy.

## 2013-06-20 NOTE — Patient Instructions (Signed)
Follow up in 3 months.  Call once you know the exact last date of chemo.  Will need to wait 1-2 weeks after chemo to get port out to make sure counts have improved.  Will also need to see when Dr. Benay Spice plans to get scans.

## 2013-06-24 ENCOUNTER — Ambulatory Visit (HOSPITAL_BASED_OUTPATIENT_CLINIC_OR_DEPARTMENT_OTHER): Payer: BC Managed Care – PPO

## 2013-06-24 ENCOUNTER — Other Ambulatory Visit (HOSPITAL_BASED_OUTPATIENT_CLINIC_OR_DEPARTMENT_OTHER): Payer: BC Managed Care – PPO

## 2013-06-24 VITALS — BP 129/88 | HR 55 | Temp 97.9°F | Resp 18

## 2013-06-24 DIAGNOSIS — Z5111 Encounter for antineoplastic chemotherapy: Secondary | ICD-10-CM

## 2013-06-24 DIAGNOSIS — C259 Malignant neoplasm of pancreas, unspecified: Secondary | ICD-10-CM

## 2013-06-24 DIAGNOSIS — C252 Malignant neoplasm of tail of pancreas: Secondary | ICD-10-CM

## 2013-06-24 DIAGNOSIS — C801 Malignant (primary) neoplasm, unspecified: Secondary | ICD-10-CM

## 2013-06-24 LAB — CBC WITH DIFFERENTIAL/PLATELET
BASO%: 0.5 % (ref 0.0–2.0)
Basophils Absolute: 0 10*3/uL (ref 0.0–0.1)
EOS%: 4.9 % (ref 0.0–7.0)
Eosinophils Absolute: 0.2 10*3/uL (ref 0.0–0.5)
HCT: 44.3 % (ref 38.4–49.9)
HGB: 15.2 g/dL (ref 13.0–17.1)
LYMPH%: 39.2 % (ref 14.0–49.0)
MCH: 30.8 pg (ref 27.2–33.4)
MCHC: 34.3 g/dL (ref 32.0–36.0)
MCV: 89.9 fL (ref 79.3–98.0)
MONO#: 0.7 10*3/uL (ref 0.1–0.9)
MONO%: 15.8 % — ABNORMAL HIGH (ref 0.0–14.0)
NEUT#: 1.6 10*3/uL (ref 1.5–6.5)
NEUT%: 39.6 % (ref 39.0–75.0)
PLATELETS: 189 10*3/uL (ref 140–400)
RBC: 4.93 10*6/uL (ref 4.20–5.82)
RDW: 16.1 % — ABNORMAL HIGH (ref 11.0–14.6)
WBC: 4.1 10*3/uL (ref 4.0–10.3)
lymph#: 1.6 10*3/uL (ref 0.9–3.3)
nRBC: 2 % — ABNORMAL HIGH (ref 0–0)

## 2013-06-24 MED ORDER — HEPARIN SOD (PORK) LOCK FLUSH 100 UNIT/ML IV SOLN
500.0000 [IU] | Freq: Once | INTRAVENOUS | Status: AC | PRN
  Administered 2013-06-24: 500 [IU]
  Filled 2013-06-24: qty 5

## 2013-06-24 MED ORDER — SODIUM CHLORIDE 0.9 % IV SOLN
1000.0000 mg/m2 | Freq: Once | INTRAVENOUS | Status: AC
  Administered 2013-06-24: 2128 mg via INTRAVENOUS
  Filled 2013-06-24: qty 55.97

## 2013-06-24 MED ORDER — SODIUM CHLORIDE 0.9 % IV SOLN
Freq: Once | INTRAVENOUS | Status: AC
  Administered 2013-06-24: 10:00:00 via INTRAVENOUS

## 2013-06-24 MED ORDER — PROCHLORPERAZINE MALEATE 10 MG PO TABS
ORAL_TABLET | ORAL | Status: AC
  Filled 2013-06-24: qty 1

## 2013-06-24 MED ORDER — PROCHLORPERAZINE MALEATE 10 MG PO TABS
10.0000 mg | ORAL_TABLET | Freq: Once | ORAL | Status: AC
  Administered 2013-06-24: 10 mg via ORAL

## 2013-06-24 MED ORDER — SODIUM CHLORIDE 0.9 % IJ SOLN
10.0000 mL | INTRAMUSCULAR | Status: DC | PRN
  Administered 2013-06-24: 10 mL
  Filled 2013-06-24: qty 10

## 2013-06-24 NOTE — Patient Instructions (Signed)
[facility] Discharge Instructions for Patients Receiving Chemotherapy  Today you received the following chemotherapy agents:  Gemzar  To help prevent nausea and vomiting after your treatment, we encourage you to take your nausea medication as ordered per MD.   If you develop nausea and vomiting that is not controlled by your nausea medication, call the clinic.   BELOW ARE SYMPTOMS THAT SHOULD BE REPORTED IMMEDIATELY:  *FEVER GREATER THAN 100.5 F  *CHILLS WITH OR WITHOUT FEVER  NAUSEA AND VOMITING THAT IS NOT CONTROLLED WITH YOUR NAUSEA MEDICATION  *UNUSUAL SHORTNESS OF BREATH  *UNUSUAL BRUISING OR BLEEDING  TENDERNESS IN MOUTH AND THROAT WITH OR WITHOUT PRESENCE OF ULCERS  *URINARY PROBLEMS  *BOWEL PROBLEMS  UNUSUAL RASH Items with * indicate a potential emergency and should be followed up as soon as possible.  Feel free to call the clinic you have any questions or concerns. The clinic phone number is [phone].    

## 2013-06-25 ENCOUNTER — Encounter: Payer: Self-pay | Admitting: Radiation Oncology

## 2013-06-26 ENCOUNTER — Ambulatory Visit
Admission: RE | Admit: 2013-06-26 | Discharge: 2013-06-26 | Disposition: A | Discharge status: Home / Self Care | Payer: BC Managed Care – PPO | Source: Ambulatory Visit | Attending: Radiation Oncology | Admitting: Radiation Oncology

## 2013-06-26 ENCOUNTER — Encounter: Payer: Self-pay | Admitting: Radiation Oncology

## 2013-06-26 VITALS — BP 135/84 | HR 77 | Temp 98.3°F | Resp 20 | Wt 201.6 lb

## 2013-06-26 DIAGNOSIS — C252 Malignant neoplasm of tail of pancreas: Secondary | ICD-10-CM

## 2013-06-26 NOTE — Progress Notes (Signed)
Follow up pancreas radtx  04/28/13-05/22/13, had chemotherapy  06/24/13 gemcitabine infusion, "it slows me down,fatigue", appetite good,  Feel a little quuezy at times, drinks plenty water, just food just doesn't taste as good, hs quit smoking 2:20 PM

## 2013-06-26 NOTE — Progress Notes (Signed)
Radiation Oncology         [phone] ________________________________  Name: Jon Pena MRN: [id number]  Date: 06/26/2013  DOB: August 18, 1950  Follow-Up Visit Note  CC: Leonides Grills, MD  Ladell Pier, MD  Diagnosis:   Pancreatic cancer  Interval Since Last Radiation:  One month   Narrative:  The patient returns today for  follow-up.  The patient completed radiation treatment on 05/22/2013. He notably decided not to complete the entire course of radiation. Rather she stopped after 30.6 gray out of a planned 50.4 gray.  The patient is continuing with chemotherapy with Dr. Benay Spice. He states that overall he has been feeling better and he feels that his chemotherapy is going fairly well. He does have some low-grade nausea at times. The patient has quit smoking.                              ALLERGIES:  has No Known Allergies.  Meds: Current Outpatient Prescriptions  Medication Sig Dispense Refill  . acyclovir (ZOVIRAX) 800 MG tablet Take 800 mg by mouth 4 (four) times daily as needed (for outbreaks).      . Blood Glucose Monitoring Suppl (BLOOD GLUCOSE METER) kit Use as directed  1 each  0  . famotidine (PEPCID) 20 MG tablet Take 20 mg by mouth as needed for heartburn.      . Glucose Blood (BLOOD GLUCOSE TEST STRIPS) STRP 1 strip by Subdermal route 2 (two) times daily.  60 each  0  . Lancets (ACCU-CHEK MULTICLIX) lancets Use as instructed  100 each  0  . lidocaine-prilocaine (EMLA) cream Apply topically as needed. Apply to [facility] site 1-2  Hours prior to stick and cover with plastic wrap  30 g  prn  . naproxen sodium (ANAPROX) 220 MG tablet Take 220 mg by mouth as needed. Takes 2 prn when he plays golf      . ondansetron (ZOFRAN) 8 MG tablet Take 1 tablet (8 mg total) by mouth every 8 (eight) hours as needed for nausea or vomiting.  30 tablet  0  . Polyethyl Glycol-Propyl Glycol (SYSTANE) 0.4-0.3 % SOLN Place 1 drop into both eyes daily as needed (for dry eyes).      .  prochlorperazine (COMPAZINE) 10 MG tablet Take 1 tablet (10 mg total) by mouth every 6 (six) hours as needed (nausea).  60 tablet  1   No current facility-administered medications for this encounter.    Physical Findings: The patient is in no acute distress. Patient is alert and oriented.  weight is 201 lb 9.6 oz (91.445 kg). His oral temperature is 98.3 F (36.8 C). His blood pressure is 135/84 and his pulse is 77. His respiration is 20 and oxygen saturation is 95%. .     Lab Findings: Lab Results  Component Value Date   WBC 4.1 06/24/2013   HGB 15.2 06/24/2013   HCT 44.3 06/24/2013   MCV 89.9 06/24/2013   PLT 189 06/24/2013     Radiographic Findings: No results found.  Impression:    The patient is doing satisfactorily at this time. Again, the patient did not complete his entire course of treatment. Continuing with systemic treatment through Dr. Benay Spice.  Plan:  We discussed various followup options and he wishes to followup on a when necessary basis. The patient knows that I would be happy to see him at any point in the future if I can be of  further assistance.   Jodelle Gross, M.D., Ph.D.

## 2013-06-29 ENCOUNTER — Other Ambulatory Visit: Payer: Self-pay | Admitting: Oncology

## 2013-07-01 ENCOUNTER — Other Ambulatory Visit (HOSPITAL_BASED_OUTPATIENT_CLINIC_OR_DEPARTMENT_OTHER): Payer: BC Managed Care – PPO

## 2013-07-01 ENCOUNTER — Ambulatory Visit: Payer: BC Managed Care – PPO

## 2013-07-01 ENCOUNTER — Ambulatory Visit (HOSPITAL_BASED_OUTPATIENT_CLINIC_OR_DEPARTMENT_OTHER): Payer: BC Managed Care – PPO

## 2013-07-01 ENCOUNTER — Telehealth: Payer: Self-pay | Admitting: Oncology

## 2013-07-01 ENCOUNTER — Ambulatory Visit (HOSPITAL_BASED_OUTPATIENT_CLINIC_OR_DEPARTMENT_OTHER): Payer: BC Managed Care – PPO | Admitting: Oncology

## 2013-07-01 VITALS — BP 135/81 | HR 55 | Temp 98.2°F | Resp 20 | Ht 71.0 in | Wt 200.2 lb

## 2013-07-01 DIAGNOSIS — N281 Cyst of kidney, acquired: Secondary | ICD-10-CM

## 2013-07-01 DIAGNOSIS — L723 Sebaceous cyst: Secondary | ICD-10-CM

## 2013-07-01 DIAGNOSIS — L719 Rosacea, unspecified: Secondary | ICD-10-CM

## 2013-07-01 DIAGNOSIS — C259 Malignant neoplasm of pancreas, unspecified: Secondary | ICD-10-CM

## 2013-07-01 DIAGNOSIS — C252 Malignant neoplasm of tail of pancreas: Secondary | ICD-10-CM

## 2013-07-01 DIAGNOSIS — C801 Malignant (primary) neoplasm, unspecified: Secondary | ICD-10-CM

## 2013-07-01 DIAGNOSIS — Z5111 Encounter for antineoplastic chemotherapy: Secondary | ICD-10-CM

## 2013-07-01 LAB — CBC WITH DIFFERENTIAL/PLATELET
BASO%: 0.3 % (ref 0.0–2.0)
BASOS ABS: 0 10*3/uL (ref 0.0–0.1)
EOS ABS: 0.2 10*3/uL (ref 0.0–0.5)
EOS%: 4.1 % (ref 0.0–7.0)
HEMATOCRIT: 42.3 % (ref 38.4–49.9)
HGB: 14.6 g/dL (ref 13.0–17.1)
LYMPH%: 39.6 % (ref 14.0–49.0)
MCH: 30.9 pg (ref 27.2–33.4)
MCHC: 34.5 g/dL (ref 32.0–36.0)
MCV: 89.4 fL (ref 79.3–98.0)
MONO#: 0.5 10*3/uL (ref 0.1–0.9)
MONO%: 13.2 % (ref 0.0–14.0)
NEUT#: 1.6 10*3/uL (ref 1.5–6.5)
NEUT%: 42.8 % (ref 39.0–75.0)
PLATELETS: 133 10*3/uL — AB (ref 140–400)
RBC: 4.73 10*6/uL (ref 4.20–5.82)
RDW: 16.1 % — AB (ref 11.0–14.6)
WBC: 3.6 10*3/uL — ABNORMAL LOW (ref 4.0–10.3)
lymph#: 1.4 10*3/uL (ref 0.9–3.3)
nRBC: 4 % — ABNORMAL HIGH (ref 0–0)

## 2013-07-01 MED ORDER — PROCHLORPERAZINE MALEATE 10 MG PO TABS
10.0000 mg | ORAL_TABLET | Freq: Once | ORAL | Status: AC
  Administered 2013-07-01: 10 mg via ORAL

## 2013-07-01 MED ORDER — GEMCITABINE HCL CHEMO INJECTION 1 GM/26.3ML
1000.0000 mg/m2 | Freq: Once | INTRAVENOUS | Status: AC
  Administered 2013-07-01: 2128 mg via INTRAVENOUS
  Filled 2013-07-01: qty 55.97

## 2013-07-01 MED ORDER — PROCHLORPERAZINE MALEATE 10 MG PO TABS
ORAL_TABLET | ORAL | Status: AC
  Filled 2013-07-01: qty 1

## 2013-07-01 MED ORDER — HEPARIN SOD (PORK) LOCK FLUSH 100 UNIT/ML IV SOLN
500.0000 [IU] | Freq: Once | INTRAVENOUS | Status: AC | PRN
  Administered 2013-07-01: 500 [IU]
  Filled 2013-07-01: qty 5

## 2013-07-01 MED ORDER — SODIUM CHLORIDE 0.9 % IV SOLN
Freq: Once | INTRAVENOUS | Status: AC
  Administered 2013-07-01: 16:00:00 via INTRAVENOUS

## 2013-07-01 MED ORDER — SODIUM CHLORIDE 0.9 % IJ SOLN
10.0000 mL | INTRAMUSCULAR | Status: DC | PRN
  Administered 2013-07-01: 10 mL
  Filled 2013-07-01: qty 10

## 2013-07-01 NOTE — Telephone Encounter (Signed)
Gave pt appt for lab,md and chemo for january and February, emailed [facility] regarding chemo

## 2013-07-01 NOTE — Patient Instructions (Signed)
[facility] Discharge Instructions for Patients Receiving Chemotherapy  Today you received the following chemotherapy agents Gemzar.  To help prevent nausea and vomiting after your treatment, we encourage you to take your nausea medication as prescribed.   If you develop nausea and vomiting that is not controlled by your nausea medication, call the clinic.   BELOW ARE SYMPTOMS THAT SHOULD BE REPORTED IMMEDIATELY:  *FEVER GREATER THAN 100.5 F  *CHILLS WITH OR WITHOUT FEVER  NAUSEA AND VOMITING THAT IS NOT CONTROLLED WITH YOUR NAUSEA MEDICATION  *UNUSUAL SHORTNESS OF BREATH  *UNUSUAL BRUISING OR BLEEDING  TENDERNESS IN MOUTH AND THROAT WITH OR WITHOUT PRESENCE OF ULCERS  *URINARY PROBLEMS  *BOWEL PROBLEMS  UNUSUAL RASH Items with * indicate a potential emergency and should be followed up as soon as possible.  Feel free to call the clinic you have any questions or concerns. The clinic phone number is [phone].    

## 2013-07-01 NOTE — Progress Notes (Signed)
   Jon Pena    OFFICE PROGRESS NOTE   INTERVAL HISTORY:   He resumed gemcitabine 06/17/2013. Mild nausea and dyspepsia. He continues to have drainage from the skin lesion at the lower back. No fever. "Rosacea "rash over the nose and cheeks.  Objective:  Vital signs in last 24 hours:  Blood pressure 135/81, pulse 55, temperature 98.2 F (36.8 C), temperature source Oral, resp. rate 20, height 5\' 11"  (1.803 m), weight 200 lb 3.2 oz (90.81 kg).    HEENT: No thrush or ulcers, erythematous pustular rash over the cheeks and nose. Resp: Lungs clear bilaterally Cardio: Regular rate and rhythm GI: No hepatomegaly, nontender, no mass Vascular: No leg edema  Skin: Less than 1 cm slightly indurated lesion at the lower back without fluctuance or drainage   Portacath/PICC-without erythema  Lab Results:  Lab Results  Component Value Date   WBC 3.6* 07/01/2013   HGB 14.6 07/01/2013   HCT 42.3 07/01/2013   MCV 89.4 07/01/2013   PLT 133* 07/01/2013   NEUTROABS 1.6 07/01/2013      Medications: I have reviewed the patient's current medications.  Assessment/Plan: 1. 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 Adjuvant gemcitabine 03/27/2013, 04/03/2013 and 04/10/2013.  Initiation of radiation and capecitabine 04/28/2013. Discontinued 05/22/2013 secondary to nausea.  Adjuvant gemcitabine resumed 06/17/2013.  2. Left renal cyst with a mural nodular component on MRI 02/20/2013. Indeterminate. 3. Acute appendicitis status post laparoscopic appendectomy 01/26/2013. 4. Port-A-Cath placement 03/21/2013. 5. Right mid to low back sebaceous cyst. Appears improved today. He plans to see Dr. Allyson Sabal if the cyst in largest 6. Rosacea rash over the face   Disposition:  Mr. Risdon appears to be tolerating the gemcitabine well. He will complete the first 3 week cycle of adjuvant therapy today. He will  return for the next cycle of gemcitabine on 07/15/2013. He is scheduled for an office visit on 07/29/2013.   Betsy Coder, MD  07/01/2013  3:41 PM

## 2013-07-02 ENCOUNTER — Telehealth: Payer: Self-pay | Admitting: *Deleted

## 2013-07-02 NOTE — Telephone Encounter (Signed)
I have adjusted 2/10 appt

## 2013-07-05 ENCOUNTER — Telehealth: Payer: Self-pay | Admitting: Oncology

## 2013-07-05 NOTE — Telephone Encounter (Signed)
Talked to pt and gave him appt for January and February , lab,md and chemo

## 2013-07-08 ENCOUNTER — Telehealth: Payer: Self-pay | Admitting: *Deleted

## 2013-07-08 NOTE — Telephone Encounter (Signed)
Spoke with patient to assess how he is doing.  He stated his nausea is somewhat better now that he has ended his XRT.  He stated he is not looking forward to starting back on chemo next week.  He asked about nutritional supplements to try that will help maintain weight.  This RN asked dietician to follow-up with patient next week during chemo.  Patient stated he is doing well otherwise.  He lives with his mother and he stated they are managing things okay.  He appreciated the call and denied further needs.  Will continue to follow.

## 2013-07-13 ENCOUNTER — Other Ambulatory Visit: Payer: Self-pay | Admitting: Oncology

## 2013-07-15 ENCOUNTER — Ambulatory Visit: Payer: BC Managed Care – PPO | Admitting: Nutrition

## 2013-07-15 ENCOUNTER — Other Ambulatory Visit (HOSPITAL_BASED_OUTPATIENT_CLINIC_OR_DEPARTMENT_OTHER): Payer: BC Managed Care – PPO

## 2013-07-15 ENCOUNTER — Ambulatory Visit (HOSPITAL_BASED_OUTPATIENT_CLINIC_OR_DEPARTMENT_OTHER): Payer: BC Managed Care – PPO

## 2013-07-15 VITALS — BP 123/81 | HR 62 | Temp 98.3°F | Resp 18

## 2013-07-15 DIAGNOSIS — C252 Malignant neoplasm of tail of pancreas: Secondary | ICD-10-CM

## 2013-07-15 DIAGNOSIS — C259 Malignant neoplasm of pancreas, unspecified: Secondary | ICD-10-CM

## 2013-07-15 DIAGNOSIS — Z5111 Encounter for antineoplastic chemotherapy: Secondary | ICD-10-CM

## 2013-07-15 DIAGNOSIS — C801 Malignant (primary) neoplasm, unspecified: Secondary | ICD-10-CM

## 2013-07-15 LAB — COMPREHENSIVE METABOLIC PANEL (CC13)
ALK PHOS: 128 U/L (ref 40–150)
ALT: 37 U/L (ref 0–55)
ANION GAP: 9 meq/L (ref 3–11)
AST: 25 U/L (ref 5–34)
Albumin: 3.8 g/dL (ref 3.5–5.0)
BILIRUBIN TOTAL: 0.26 mg/dL (ref 0.20–1.20)
BUN: 23.3 mg/dL (ref 7.0–26.0)
CO2: 26 mEq/L (ref 22–29)
Calcium: 9.3 mg/dL (ref 8.4–10.4)
Chloride: 103 mEq/L (ref 98–109)
Creatinine: 1 mg/dL (ref 0.7–1.3)
GLUCOSE: 173 mg/dL — AB (ref 70–140)
Potassium: 4.4 mEq/L (ref 3.5–5.1)
Sodium: 137 mEq/L (ref 136–145)
Total Protein: 7 g/dL (ref 6.4–8.3)

## 2013-07-15 LAB — CBC WITH DIFFERENTIAL/PLATELET
BASO%: 0.7 % (ref 0.0–2.0)
BASOS ABS: 0.1 10*3/uL (ref 0.0–0.1)
EOS ABS: 0.3 10*3/uL (ref 0.0–0.5)
EOS%: 4.2 % (ref 0.0–7.0)
HEMATOCRIT: 43.4 % (ref 38.4–49.9)
HEMOGLOBIN: 14.9 g/dL (ref 13.0–17.1)
LYMPH#: 1.3 10*3/uL (ref 0.9–3.3)
LYMPH%: 19.2 % (ref 14.0–49.0)
MCH: 31.9 pg (ref 27.2–33.4)
MCHC: 34.2 g/dL (ref 32.0–36.0)
MCV: 93.1 fL (ref 79.3–98.0)
MONO#: 1.5 10*3/uL — AB (ref 0.1–0.9)
MONO%: 21.1 % — ABNORMAL HIGH (ref 0.0–14.0)
NEUT#: 3.8 10*3/uL (ref 1.5–6.5)
NEUT%: 54.8 % (ref 39.0–75.0)
Platelets: 327 10*3/uL (ref 140–400)
RBC: 4.66 10*6/uL (ref 4.20–5.82)
RDW: 17.3 % — ABNORMAL HIGH (ref 11.0–14.6)
WBC: 7 10*3/uL (ref 4.0–10.3)

## 2013-07-15 MED ORDER — PROCHLORPERAZINE MALEATE 10 MG PO TABS
ORAL_TABLET | ORAL | Status: AC
  Filled 2013-07-15: qty 1

## 2013-07-15 MED ORDER — HEPARIN SOD (PORK) LOCK FLUSH 100 UNIT/ML IV SOLN
500.0000 [IU] | Freq: Once | INTRAVENOUS | Status: AC | PRN
  Administered 2013-07-15: 500 [IU]
  Filled 2013-07-15: qty 5

## 2013-07-15 MED ORDER — PROCHLORPERAZINE MALEATE 10 MG PO TABS
10.0000 mg | ORAL_TABLET | Freq: Once | ORAL | Status: AC
  Administered 2013-07-15: 10 mg via ORAL

## 2013-07-15 MED ORDER — SODIUM CHLORIDE 0.9 % IJ SOLN
10.0000 mL | INTRAMUSCULAR | Status: DC | PRN
Start: 2013-07-15 — End: 2013-07-15
  Administered 2013-07-15: 10 mL
  Filled 2013-07-15: qty 10

## 2013-07-15 MED ORDER — SODIUM CHLORIDE 0.9 % IV SOLN
1000.0000 mg/m2 | Freq: Once | INTRAVENOUS | Status: AC
  Administered 2013-07-15: 2128 mg via INTRAVENOUS
  Filled 2013-07-15: qty 55.97

## 2013-07-15 MED ORDER — SODIUM CHLORIDE 0.9 % IV SOLN
Freq: Once | INTRAVENOUS | Status: AC
  Administered 2013-07-15: 09:00:00 via INTRAVENOUS

## 2013-07-15 NOTE — Progress Notes (Signed)
Followup completed with patient in the chemotherapy area.  Patient is pleased that he has had some weight loss.  Weight was documented as 200.2 pounds January 13.  He reports mild nausea and dyspepsia.  He has some difficulty when he eats higher fat foods and states "it bothers his pancreas".  Nutrition diagnosis: Food and nutrition related knowledge deficit improved.  Intervention: Patient educated to continue small amounts of bland foods during episodes of nausea.  He was encouraged to consume oral nutrition supplements when not feeling well.  Patient is requesting supplements with less sugar so patient was provided with samples of Glucerna for improved blood sugar control.  Advised patient on low-fat diet for relief of pain.  Questions were answered.  Teach back method used.  Monitoring, evaluation, goals: Patient will tolerate adequate calories and protein to promote weight maintenance.  Next visit: No followup scheduled at this time.  Patient has my contact information for questions or concerns.

## 2013-07-15 NOTE — Patient Instructions (Signed)
[facility] Discharge Instructions for Patients Receiving Chemotherapy  Today you received the following chemotherapy agents Gemzar  To help prevent nausea and vomiting after your treatment, we encourage you to take your nausea medication Zofran 8mg every 8 hours as needed for nausea or vomiting and compazine10 mg every 6 hours as needed.   If you develop nausea and vomiting that is not controlled by your nausea medication, call the clinic.   BELOW ARE SYMPTOMS THAT SHOULD BE REPORTED IMMEDIATELY:  *FEVER GREATER THAN 100.5 F  *CHILLS WITH OR WITHOUT FEVER  NAUSEA AND VOMITING THAT IS NOT CONTROLLED WITH YOUR NAUSEA MEDICATION  *UNUSUAL SHORTNESS OF BREATH  *UNUSUAL BRUISING OR BLEEDING  TENDERNESS IN MOUTH AND THROAT WITH OR WITHOUT PRESENCE OF ULCERS  *URINARY PROBLEMS  *BOWEL PROBLEMS  UNUSUAL RASH Items with * indicate a potential emergency and should be followed up as soon as possible.  Feel free to call the clinic you have any questions or concerns. The clinic phone number is [phone].    

## 2013-07-15 NOTE — Progress Notes (Signed)
Discharged at 89 with mom.  Ambulatory in no distress.

## 2013-07-22 ENCOUNTER — Other Ambulatory Visit (HOSPITAL_BASED_OUTPATIENT_CLINIC_OR_DEPARTMENT_OTHER): Payer: BC Managed Care – PPO

## 2013-07-22 ENCOUNTER — Ambulatory Visit (HOSPITAL_BASED_OUTPATIENT_CLINIC_OR_DEPARTMENT_OTHER): Payer: BC Managed Care – PPO

## 2013-07-22 VITALS — BP 133/85 | HR 58 | Temp 98.3°F | Resp 18

## 2013-07-22 DIAGNOSIS — Z5111 Encounter for antineoplastic chemotherapy: Secondary | ICD-10-CM

## 2013-07-22 DIAGNOSIS — C252 Malignant neoplasm of tail of pancreas: Secondary | ICD-10-CM

## 2013-07-22 DIAGNOSIS — C801 Malignant (primary) neoplasm, unspecified: Secondary | ICD-10-CM

## 2013-07-22 DIAGNOSIS — C259 Malignant neoplasm of pancreas, unspecified: Secondary | ICD-10-CM

## 2013-07-22 LAB — CBC WITH DIFFERENTIAL/PLATELET
BASO%: 1.6 % (ref 0.0–2.0)
BASOS ABS: 0.1 10*3/uL (ref 0.0–0.1)
EOS%: 1.8 % (ref 0.0–7.0)
Eosinophils Absolute: 0.1 10*3/uL (ref 0.0–0.5)
HCT: 40.2 % (ref 38.4–49.9)
HEMOGLOBIN: 13.9 g/dL (ref 13.0–17.1)
LYMPH#: 1.3 10*3/uL (ref 0.9–3.3)
LYMPH%: 26.7 % (ref 14.0–49.0)
MCH: 31.2 pg (ref 27.2–33.4)
MCHC: 34.6 g/dL (ref 32.0–36.0)
MCV: 90.3 fL (ref 79.3–98.0)
MONO#: 0.6 10*3/uL (ref 0.1–0.9)
MONO%: 12.7 % (ref 0.0–14.0)
NEUT%: 57.2 % (ref 39.0–75.0)
NEUTROS ABS: 2.8 10*3/uL (ref 1.5–6.5)
Platelets: 474 10*3/uL — ABNORMAL HIGH (ref 140–400)
RBC: 4.45 10*6/uL (ref 4.20–5.82)
RDW: 17 % — ABNORMAL HIGH (ref 11.0–14.6)
WBC: 5 10*3/uL (ref 4.0–10.3)
nRBC: 3 % — ABNORMAL HIGH (ref 0–0)

## 2013-07-22 LAB — TECHNOLOGIST REVIEW

## 2013-07-22 MED ORDER — PROCHLORPERAZINE MALEATE 10 MG PO TABS
ORAL_TABLET | ORAL | Status: AC
  Filled 2013-07-22: qty 1

## 2013-07-22 MED ORDER — HEPARIN SOD (PORK) LOCK FLUSH 100 UNIT/ML IV SOLN
500.0000 [IU] | Freq: Once | INTRAVENOUS | Status: AC | PRN
  Administered 2013-07-22: 500 [IU]
  Filled 2013-07-22: qty 5

## 2013-07-22 MED ORDER — SODIUM CHLORIDE 0.9 % IV SOLN
1000.0000 mg/m2 | Freq: Once | INTRAVENOUS | Status: AC
  Administered 2013-07-22: 2128 mg via INTRAVENOUS
  Filled 2013-07-22: qty 55.97

## 2013-07-22 MED ORDER — SODIUM CHLORIDE 0.9 % IJ SOLN
10.0000 mL | INTRAMUSCULAR | Status: DC | PRN
  Administered 2013-07-22: 10 mL
  Filled 2013-07-22: qty 10

## 2013-07-22 MED ORDER — SODIUM CHLORIDE 0.9 % IV SOLN
Freq: Once | INTRAVENOUS | Status: AC
  Administered 2013-07-22: 10:00:00 via INTRAVENOUS

## 2013-07-22 MED ORDER — PROCHLORPERAZINE MALEATE 10 MG PO TABS
10.0000 mg | ORAL_TABLET | Freq: Once | ORAL | Status: AC
  Administered 2013-07-22: 10 mg via ORAL

## 2013-07-22 NOTE — Patient Instructions (Signed)
[facility] Discharge Instructions for Patients Receiving Chemotherapy  Today you received the following chemotherapy agents Gemzar  To help prevent nausea and vomiting after your treatment, we encourage you to take your nausea medication Zofran 8mg  every 8 hours as needed for nausea or vomiting and compazine10 mg every 6 hours as needed.   If you develop nausea and vomiting that is not controlled by your nausea medication, call the clinic.   BELOW ARE SYMPTOMS THAT SHOULD BE REPORTED IMMEDIATELY:  *FEVER GREATER THAN 100.5 F  *CHILLS WITH OR WITHOUT FEVER  NAUSEA AND VOMITING THAT IS NOT CONTROLLED WITH YOUR NAUSEA MEDICATION  *UNUSUAL SHORTNESS OF BREATH  *UNUSUAL BRUISING OR BLEEDING  TENDERNESS IN MOUTH AND THROAT WITH OR WITHOUT PRESENCE OF ULCERS  *URINARY PROBLEMS  *BOWEL PROBLEMS  UNUSUAL RASH Items with * indicate a potential emergency and should be followed up as soon as possible.  Feel free to call the clinic you have any questions or concerns. The clinic phone number is [phone].

## 2013-07-27 ENCOUNTER — Other Ambulatory Visit: Payer: Self-pay | Admitting: Oncology

## 2013-07-29 ENCOUNTER — Other Ambulatory Visit (INDEPENDENT_AMBULATORY_CARE_PROVIDER_SITE_OTHER): Payer: Self-pay | Admitting: General Surgery

## 2013-07-29 ENCOUNTER — Other Ambulatory Visit (HOSPITAL_BASED_OUTPATIENT_CLINIC_OR_DEPARTMENT_OTHER): Payer: BC Managed Care – PPO

## 2013-07-29 ENCOUNTER — Ambulatory Visit: Payer: BC Managed Care – PPO

## 2013-07-29 ENCOUNTER — Other Ambulatory Visit: Payer: BC Managed Care – PPO

## 2013-07-29 ENCOUNTER — Ambulatory Visit (HOSPITAL_BASED_OUTPATIENT_CLINIC_OR_DEPARTMENT_OTHER): Payer: BC Managed Care – PPO | Admitting: Nurse Practitioner

## 2013-07-29 ENCOUNTER — Telehealth: Payer: Self-pay | Admitting: Oncology

## 2013-07-29 VITALS — BP 151/82 | HR 60 | Temp 98.4°F | Resp 18 | Ht 71.0 in | Wt 203.5 lb

## 2013-07-29 DIAGNOSIS — C252 Malignant neoplasm of tail of pancreas: Secondary | ICD-10-CM

## 2013-07-29 DIAGNOSIS — C259 Malignant neoplasm of pancreas, unspecified: Secondary | ICD-10-CM

## 2013-07-29 DIAGNOSIS — Z87891 Personal history of nicotine dependence: Secondary | ICD-10-CM

## 2013-07-29 DIAGNOSIS — L719 Rosacea, unspecified: Secondary | ICD-10-CM

## 2013-07-29 DIAGNOSIS — L723 Sebaceous cyst: Secondary | ICD-10-CM

## 2013-07-29 DIAGNOSIS — N281 Cyst of kidney, acquired: Secondary | ICD-10-CM

## 2013-07-29 LAB — CBC WITH DIFFERENTIAL/PLATELET
BASO%: 1.5 % (ref 0.0–2.0)
BASOS ABS: 0.1 10*3/uL (ref 0.0–0.1)
EOS%: 2.5 % (ref 0.0–7.0)
Eosinophils Absolute: 0.1 10*3/uL (ref 0.0–0.5)
HCT: 39.7 % (ref 38.4–49.9)
HEMOGLOBIN: 13.7 g/dL (ref 13.0–17.1)
LYMPH#: 1.7 10*3/uL (ref 0.9–3.3)
LYMPH%: 35.6 % (ref 14.0–49.0)
MCH: 31.4 pg (ref 27.2–33.4)
MCHC: 34.5 g/dL (ref 32.0–36.0)
MCV: 91.1 fL (ref 79.3–98.0)
MONO#: 0.7 10*3/uL (ref 0.1–0.9)
MONO%: 15.2 % — AB (ref 0.0–14.0)
NEUT#: 2.2 10*3/uL (ref 1.5–6.5)
NEUT%: 45.2 % (ref 39.0–75.0)
Platelets: 204 10*3/uL (ref 140–400)
RBC: 4.36 10*6/uL (ref 4.20–5.82)
RDW: 17.1 % — AB (ref 11.0–14.6)
WBC: 4.8 10*3/uL (ref 4.0–10.3)
nRBC: 8 % — ABNORMAL HIGH (ref 0–0)

## 2013-07-29 LAB — TECHNOLOGIST REVIEW

## 2013-07-29 NOTE — Telephone Encounter (Signed)
gv and printed appt sched and avs forpt for May....sent msg to [employer] @ [facility].

## 2013-07-29 NOTE — Progress Notes (Addendum)
OFFICE PROGRESS NOTE  Interval history:  [name] returns for followup of pancreas cancer. He was last treated with gemcitabine on 07/22/2013. He had some nausea. No vomiting. No mouth sores. No diarrhea. He continues to have a rosacea rash on his face. No fever. He reports he quit smoking 6 months ago.  He has decided to discontinue further chemotherapy. He feels enough "chemicals" have been introduced into his body.   Objective: Filed Vitals:   07/29/13 1206  BP: 151/82  Pulse: 60  Temp: 98.4 F (36.9 C)  Resp: 18   Oropharynx is without thrush or ulceration. Persistent erythematous pustular rash over the cheeks and nose. Lungs clear. Regular cardiac rhythm. Port-A-Cath site without erythema. Abdomen soft and nontender. No hepatomegaly. No mass. No leg edema.   Lab Results: Lab Results  Component Value Date   WBC 4.8 07/29/2013   HGB 13.7 07/29/2013   HCT 39.7 07/29/2013   MCV 91.1 07/29/2013   PLT 204 07/29/2013   NEUTROABS 2.2 07/29/2013    Chemistry:    Chemistry      Component Value Date/Time   NA 137 07/15/2013 0852   NA 136 03/04/2013 1140   K 4.4 07/15/2013 0852   K 3.7 03/04/2013 1140   CL 96 03/04/2013 1140   CO2 26 07/15/2013 0852   CO2 29 03/04/2013 1140   BUN 23.3 07/15/2013 0852   BUN 10 03/04/2013 1140   CREATININE 1.0 07/15/2013 0852   CREATININE 0.82 03/04/2013 1140      Component Value Date/Time   CALCIUM 9.3 07/15/2013 0852   CALCIUM 9.1 03/04/2013 1140   ALKPHOS 128 07/15/2013 0852   ALKPHOS 72 02/25/2013 0842   AST 25 07/15/2013 0852   AST 17 02/25/2013 0842   ALT 37 07/15/2013 0852   ALT 22 02/25/2013 0842   BILITOT 0.26 07/15/2013 0852   BILITOT 0.1* 02/25/2013 0842       Studies/Results: No results found.  Medications: I have reviewed the patient's current medications.  Assessment/Plan: 1. 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 Adjuvant gemcitabine 03/27/2013,  04/03/2013 and 04/10/2013.  Initiation of radiation and capecitabine 04/28/2013. Discontinued 05/22/2013 secondary to nausea.  Adjuvant gemcitabine resumed 06/17/2013. Last gemcitabine given 07/22/2013. 2. Left renal cyst with a mural nodular component on MRI 02/20/2013. Indeterminate. 3. Acute appendicitis status post laparoscopic appendectomy 01/26/2013. 4. Port-A-Cath placement 03/21/2013. 5. Right mid to low back sebaceous cyst.  6. Rosacea rash over the face. 7. Positive tobacco history. He reports quitting smoking 6 months ago.   Dispositon-Jon Pena has decided to discontinue further chemotherapy. We are referring him to Dr. Barry Pena for Port-A-Cath removal.   He is a candidate for a screening chest CT given his tobacco history. He would like to go ahead and get this scheduled.  He will return for a followup visit and CA 19-9 in 3 months. He will contact the office in the interim with any problems.    Patient seen with Dr. Benay Pena.   Jon Pena ANP/GNP-BC   This was a shared visit with Jon Pena. Jon Pena has decided to discontinue adjuvant chemotherapy. We will refer him to Dr. Barry Pena for removal of the Port-A-Cath. He will return for an office visit in 3 months.  Jon Pena, M.D.

## 2013-08-04 ENCOUNTER — Ambulatory Visit (HOSPITAL_COMMUNITY)
Admission: RE | Admit: 2013-08-04 | Discharge: 2013-08-04 | Disposition: A | Discharge status: Home / Self Care | Payer: BC Managed Care – PPO | Source: Ambulatory Visit | Attending: Nurse Practitioner | Admitting: Nurse Practitioner

## 2013-08-04 ENCOUNTER — Telehealth: Payer: Self-pay | Admitting: *Deleted

## 2013-08-04 DIAGNOSIS — R911 Solitary pulmonary nodule: Secondary | ICD-10-CM | POA: Insufficient documentation

## 2013-08-04 DIAGNOSIS — I2510 Atherosclerotic heart disease of native coronary artery without angina pectoris: Secondary | ICD-10-CM | POA: Insufficient documentation

## 2013-08-04 DIAGNOSIS — C252 Malignant neoplasm of tail of pancreas: Secondary | ICD-10-CM

## 2013-08-04 DIAGNOSIS — Z87891 Personal history of nicotine dependence: Secondary | ICD-10-CM | POA: Insufficient documentation

## 2013-08-04 NOTE — Telephone Encounter (Signed)
Called to request results of CT chest done today at 0800. Will be at current # till 2 pm today, then call cell phone afterwards.

## 2013-08-05 ENCOUNTER — Encounter: Payer: Self-pay | Admitting: Nurse Practitioner

## 2013-08-05 ENCOUNTER — Telehealth: Payer: Self-pay | Admitting: *Deleted

## 2013-08-05 NOTE — Telephone Encounter (Signed)
THE RESULTS WERE CALLED AND FAXED. THE REPORT WAS GIVEN TO LISA THOMAS,NP. 

## 2013-08-10 ENCOUNTER — Other Ambulatory Visit: Payer: Self-pay | Admitting: Oncology

## 2013-08-10 DIAGNOSIS — C259 Malignant neoplasm of pancreas, unspecified: Secondary | ICD-10-CM

## 2013-08-12 ENCOUNTER — Telehealth: Payer: Self-pay | Admitting: *Deleted

## 2013-08-12 NOTE — Telephone Encounter (Signed)
Notified patient that MD wants repeat scan in 3-4 months to follow up lung lesions. Made him aware of the appointment on 10/31/13 at 10:30 am for scan

## 2013-08-12 NOTE — Telephone Encounter (Signed)
Message copied by Tania Ade on Tue Aug 12, 2013  4:26 PM ------      Message from: Ladell Pier      Created: Sun Aug 10, 2013  8:47 AM       Please call patient, plan for 3-4 month f/u chest CT to f/u on lung lesions, likely scarring      I discussed CT result with him. POF entered for chest CT ------

## 2013-08-18 ENCOUNTER — Telehealth: Payer: Self-pay | Admitting: *Deleted

## 2013-08-18 NOTE — Telephone Encounter (Signed)
VM reporting he ate raw oysters with his mother last week and has had 4-5 days of diarrhea and was vomiting for couple days. Still some mild nausea. No fever and he is slowly getting better but asking what should be done? Could he have food poisoning?  Called back and instructed him to go to ER or call his PCP tomorrow if not better.

## 2013-08-20 ENCOUNTER — Encounter (HOSPITAL_BASED_OUTPATIENT_CLINIC_OR_DEPARTMENT_OTHER): Payer: Self-pay | Admitting: *Deleted

## 2013-08-20 NOTE — Progress Notes (Signed)
Ate raw oysters last week-had diarrhea-mom got sick also. Told to go back on pepcid Said he may do pac out local-will come pre paired to have sedation if needed

## 2013-08-26 ENCOUNTER — Ambulatory Visit (HOSPITAL_BASED_OUTPATIENT_CLINIC_OR_DEPARTMENT_OTHER): Payer: BC Managed Care – PPO | Admitting: Anesthesiology

## 2013-08-26 ENCOUNTER — Encounter (HOSPITAL_BASED_OUTPATIENT_CLINIC_OR_DEPARTMENT_OTHER): Payer: Self-pay

## 2013-08-26 ENCOUNTER — Encounter (HOSPITAL_BASED_OUTPATIENT_CLINIC_OR_DEPARTMENT_OTHER)
Admission: RE | Disposition: A | Discharge status: Home / Self Care | Payer: Self-pay | Source: Ambulatory Visit | Attending: General Surgery

## 2013-08-26 ENCOUNTER — Encounter (HOSPITAL_BASED_OUTPATIENT_CLINIC_OR_DEPARTMENT_OTHER): Payer: BC Managed Care – PPO | Admitting: Anesthesiology

## 2013-08-26 ENCOUNTER — Ambulatory Visit (HOSPITAL_BASED_OUTPATIENT_CLINIC_OR_DEPARTMENT_OTHER)
Admission: RE | Admit: 2013-08-26 | Discharge: 2013-08-26 | Disposition: A | Discharge status: Home / Self Care | Payer: BC Managed Care – PPO | Source: Ambulatory Visit | Attending: General Surgery | Admitting: General Surgery

## 2013-08-26 DIAGNOSIS — Z452 Encounter for adjustment and management of vascular access device: Secondary | ICD-10-CM | POA: Insufficient documentation

## 2013-08-26 DIAGNOSIS — Z8509 Personal history of malignant neoplasm of other digestive organs: Secondary | ICD-10-CM | POA: Insufficient documentation

## 2013-08-26 HISTORY — PX: PORT-A-CATH REMOVAL: SHX5289

## 2013-08-26 LAB — POCT HEMOGLOBIN-HEMACUE: Hemoglobin: 15.5 g/dL (ref 13.0–17.0)

## 2013-08-26 SURGERY — REMOVAL PORT-A-CATH
Anesthesia: Monitor Anesthesia Care | Site: Chest | Laterality: Left

## 2013-08-26 MED ORDER — CEFAZOLIN SODIUM-DEXTROSE 2-3 GM-% IV SOLR
2.0000 g | INTRAVENOUS | Status: AC
Start: 2013-08-26 — End: 2013-08-26
  Administered 2013-08-26: 2 g via INTRAVENOUS

## 2013-08-26 MED ORDER — MIDAZOLAM HCL 2 MG/2ML IJ SOLN
INTRAMUSCULAR | Status: AC
  Filled 2013-08-26: qty 2

## 2013-08-26 MED ORDER — BUPIVACAINE HCL 0.25 % IJ SOLN
INTRAMUSCULAR | Status: DC | PRN
  Administered 2013-08-26: 10:00:00

## 2013-08-26 MED ORDER — LIDOCAINE HCL (CARDIAC) 20 MG/ML IV SOLN
INTRAVENOUS | Status: DC | PRN
  Administered 2013-08-26: 50 mg via INTRAVENOUS

## 2013-08-26 MED ORDER — OXYCODONE-ACETAMINOPHEN 5-325 MG PO TABS: 1.0000 | ORAL_TABLET | ORAL | Status: DC | PRN

## 2013-08-26 MED ORDER — MIDAZOLAM HCL 5 MG/5ML IJ SOLN
INTRAMUSCULAR | Status: DC | PRN
  Administered 2013-08-26: 1 mg via INTRAVENOUS

## 2013-08-26 MED ORDER — FENTANYL CITRATE 0.05 MG/ML IJ SOLN: 50.0000 ug | INTRAMUSCULAR | Status: DC | PRN

## 2013-08-26 MED ORDER — PROPOFOL INFUSION 10 MG/ML OPTIME
INTRAVENOUS | Status: DC | PRN
  Administered 2013-08-26: 100 ug/kg/min via INTRAVENOUS

## 2013-08-26 MED ORDER — BUPIVACAINE HCL (PF) 0.25 % IJ SOLN
INTRAMUSCULAR | Status: AC
  Filled 2013-08-26: qty 30

## 2013-08-26 MED ORDER — MIDAZOLAM HCL 2 MG/2ML IJ SOLN: 1.0000 mg | INTRAMUSCULAR | Status: DC | PRN

## 2013-08-26 MED ORDER — CEFAZOLIN SODIUM-DEXTROSE 2-3 GM-% IV SOLR
INTRAVENOUS | Status: AC
  Filled 2013-08-26: qty 50

## 2013-08-26 MED ORDER — PROPOFOL 10 MG/ML IV BOLUS
INTRAVENOUS | Status: AC
  Filled 2013-08-26: qty 20

## 2013-08-26 MED ORDER — FENTANYL CITRATE 0.05 MG/ML IJ SOLN
INTRAMUSCULAR | Status: AC
  Filled 2013-08-26: qty 2

## 2013-08-26 MED ORDER — FENTANYL CITRATE 0.05 MG/ML IJ SOLN: 25.0000 ug | INTRAMUSCULAR | Status: DC | PRN

## 2013-08-26 MED ORDER — FENTANYL CITRATE 0.05 MG/ML IJ SOLN
INTRAMUSCULAR | Status: DC | PRN
Start: 2013-08-26 — End: 2013-08-26
  Administered 2013-08-26: 50 ug via INTRAVENOUS

## 2013-08-26 MED ORDER — LACTATED RINGERS IV SOLN
INTRAVENOUS | Status: DC
  Administered 2013-08-26: 09:00:00 via INTRAVENOUS

## 2013-08-26 MED ORDER — BUPIVACAINE-EPINEPHRINE PF 0.5-1:200000 % IJ SOLN
INTRAMUSCULAR | Status: AC
  Filled 2013-08-26: qty 30

## 2013-08-26 SURGICAL SUPPLY — 36 items
ADH SKN CLS APL DERMABOND .7 (GAUZE/BANDAGES/DRESSINGS) ×1
BLADE HEX COATED 2.75 (ELECTRODE) ×3 IMPLANT
BLADE SURG 15 STRL LF DISP TIS (BLADE) ×1 IMPLANT
BLADE SURG 15 STRL SS (BLADE) ×3
CANISTER SUCT 1200ML W/VALVE (MISCELLANEOUS) IMPLANT
CHLORAPREP W/TINT 26ML (MISCELLANEOUS) ×3 IMPLANT
COVER MAYO STAND STRL (DRAPES) ×3 IMPLANT
COVER TABLE BACK 60X90 (DRAPES) ×3 IMPLANT
DECANTER SPIKE VIAL GLASS SM (MISCELLANEOUS) IMPLANT
DERMABOND ADVANCED (GAUZE/BANDAGES/DRESSINGS) ×2
DERMABOND ADVANCED .7 DNX12 (GAUZE/BANDAGES/DRESSINGS) ×1 IMPLANT
DRAPE PED LAPAROTOMY (DRAPES) ×3 IMPLANT
DRAPE UTILITY XL STRL (DRAPES) ×3 IMPLANT
ELECT REM PT RETURN 9FT ADLT (ELECTROSURGICAL) ×3
ELECTRODE REM PT RTRN 9FT ADLT (ELECTROSURGICAL) ×1 IMPLANT
GLOVE BIO SURGEON STRL SZ 6 (GLOVE) ×3 IMPLANT
GLOVE BIOGEL PI IND STRL 6.5 (GLOVE) ×1 IMPLANT
GLOVE BIOGEL PI INDICATOR 6.5 (GLOVE) ×2
GLOVE SURG SS PI 7.0 STRL IVOR (GLOVE) ×2 IMPLANT
GOWN STRL REUS W/ TWL LRG LVL3 (GOWN DISPOSABLE) ×1 IMPLANT
GOWN STRL REUS W/TWL 2XL LVL3 (GOWN DISPOSABLE) ×3 IMPLANT
GOWN STRL REUS W/TWL LRG LVL3 (GOWN DISPOSABLE) ×3
NDL HYPO 25X1 1.5 SAFETY (NEEDLE) ×1 IMPLANT
NEEDLE HYPO 25X1 1.5 SAFETY (NEEDLE) ×3 IMPLANT
NS IRRIG 1000ML POUR BTL (IV SOLUTION) IMPLANT
PACK BASIN DAY SURGERY FS (CUSTOM PROCEDURE TRAY) ×3 IMPLANT
PENCIL BUTTON HOLSTER BLD 10FT (ELECTRODE) ×3 IMPLANT
SUT MNCRL AB 4-0 PS2 18 (SUTURE) ×3 IMPLANT
SUT VIC AB 3-0 SH 27 (SUTURE) ×3
SUT VIC AB 3-0 SH 27X BRD (SUTURE) ×1 IMPLANT
SYR CONTROL 10ML LL (SYRINGE) ×3 IMPLANT
TOWEL OR 17X24 6PK STRL BLUE (TOWEL DISPOSABLE) ×3 IMPLANT
TOWEL OR NON WOVEN STRL DISP B (DISPOSABLE) ×1 IMPLANT
TUBE CONNECTING 20'X1/4 (TUBING)
TUBE CONNECTING 20X1/4 (TUBING) IMPLANT
YANKAUER SUCT BULB TIP NO VENT (SUCTIONS) IMPLANT

## 2013-08-26 NOTE — Op Note (Signed)
  PRE-OPERATIVE DIAGNOSIS:  un-needed Port-A-Cath for pancreatic cancer  POST-OPERATIVE DIAGNOSIS:  Same   PROCEDURE:  Procedure(s):  REMOVAL PORT-A-CATH  SURGEON:  Surgeon(s):  Stark Klein, MD  ANESTHESIA:   MAC + local  EBL:   Minimal  SPECIMEN:  None  Complications : none known  Procedure:   Pt was  identified in the holding area and taken to the operating room where he was placed supine on the operating room table.  MAC anesthesia was induced.  The left upper chest was prepped and draped.  The prior incision was anesthetized with local anesthetic.  The incision was opened with a #15 blade.  The subcutaneous tissue was divided with the cautery.  The port was identified and the capsule opened.  The four 2-0 prolene sutures were removed.  The port was then removed and pressure held on the tract.  The catheter appeared intact without evidence of breakage.  The wound was inspected for hemostasis, which was achieved with cautery.  The wound was closed with 3-0 vicryl deep dermal interrupted sutures and 4-0 Monocryl running subcuticular suture.  The wound was cleaned, dried, and dressed with dermabond.  The patient was awakened from anesthesia and taken to the PACU in stable condition.  Needle, sponge, and instrument counts are correct.

## 2013-08-26 NOTE — Discharge Instructions (Addendum)
°  Post Anesthesia Home Care Instructions  Activity: Get plenty of rest for the remainder of the day. A responsible adult should stay with you for 24 hours following the procedure.  For the next 24 hours, DO NOT: -Drive a car -Paediatric nurse -Drink alcoholic beverages -Take any medication unless instructed by your physician -Make any legal decisions or sign important papers.  Meals: Start with liquid foods such as gelatin or soup. Progress to regular foods as tolerated. Avoid greasy, spicy, heavy foods. If nausea and/or vomiting occur, drink only clear liquids until the nausea and/or vomiting subsides. Call your physician if vomiting continues.  Special Instructions/Symptoms: Your throat may feel dry or sore from the anesthesia or the breathing tube placed in your throat during surgery. If this causes discomfort, gargle with warm salt water. The discomfort should disappear within 24 hours.      [place] Office Phone Number [phone]   POST OP INSTRUCTIONS  Always review your discharge instruction sheet given to you by the facility where your surgery was performed.  IF YOU HAVE DISABILITY OR FAMILY LEAVE FORMS, YOU MUST BRING THEM TO THE OFFICE FOR PROCESSING.  DO NOT GIVE THEM TO YOUR DOCTOR.  1. A prescription for pain medication may be given to you upon discharge.  Take your pain medication as prescribed, if needed.  If narcotic pain medicine is not needed, then you may take acetaminophen (Tylenol) or ibuprofen (Advil) as needed. 2. Take your usually prescribed medications unless otherwise directed 3. If you need a refill on your pain medication, please contact your pharmacy.  They will contact our office to request authorization.  Prescriptions will not be filled after 5pm or on week-ends. 4. You should eat very light the first 24 hours after surgery, such as soup, crackers, pudding, etc.  Resume your normal diet the day after surgery 5. It is common to  experience some constipation if taking pain medication after surgery.  Increasing fluid intake and taking a stool softener will usually help or prevent this problem from occurring.  A mild laxative (Milk of Magnesia or Miralax) should be taken according to package directions if there are no bowel movements after 48 hours. 6. You may shower in 48 hours.  The surgical glue will flake off in 2-3 weeks.   7. ACTIVITIES:  No strenuous activity or heavy lifting for 1 week.   a. You may drive when you no longer are taking prescription pain medication, you can comfortably wear a seatbelt, and you can safely maneuver your car and apply brakes. b. RETURN TO WORK:  __________as tolerated.  No strenuous activity for 1 week._______________ Dennis Bast should see your doctor in the office for a follow-up appointment approximately three-four weeks after your surgery.    WHEN TO CALL YOUR DOCTOR: 1. Fever over 101.0 2. Nausea and/or vomiting. 3. Extreme swelling or bruising. 4. Continued bleeding from incision. 5. Increased pain, redness, or drainage from the incision.  The clinic staff is available to answer your questions during regular business hours.  Please dont hesitate to call and ask to speak to one of the nurses for clinical concerns.  If you have a medical emergency, go to the nearest emergency room or call 911.  A surgeon from [facility] is always on call at the hospital.  For further questions, please visit [email or web address]

## 2013-08-26 NOTE — Transfer of Care (Signed)
Immediate Anesthesia Transfer of Care Note  Patient: Jon Pena  Procedure(s) Performed: Procedure(s): REMOVAL PORT-A-CATH (Left)  Patient Location: PACU  Anesthesia Type:MAC  Level of Consciousness: sedated and patient cooperative  Airway & Oxygen Therapy: Patient Spontanous Breathing and Patient connected to face mask oxygen  Post-op Assessment: Report given to PACU RN and Post -op Vital signs reviewed and stable  Post vital signs: Reviewed and stable  Complications: No apparent anesthesia complications

## 2013-08-26 NOTE — Anesthesia Postprocedure Evaluation (Signed)
  Anesthesia Post-op Note  Patient: Jon Pena  Procedure(s) Performed: Procedure(s): REMOVAL PORT-A-CATH (Left)  Patient Location: PACU  Anesthesia Type:MAC  Level of Consciousness: awake  Airway and Oxygen Therapy: Patient Spontanous Breathing  Post-op Pain: mild  Post-op Assessment: Post-op Vital signs reviewed  Post-op Vital Signs: Reviewed  Complications: No apparent anesthesia complications

## 2013-08-26 NOTE — Anesthesia Preprocedure Evaluation (Addendum)
Anesthesia Evaluation  Patient identified by MRN, date of birth, ID band Patient awake    Reviewed: Allergy & Precautions, H&P , NPO status , Patient's Chart, lab work & pertinent test results  Airway Mallampati: II      Dental   Pulmonary shortness of breath, former smoker,  breath sounds clear to auscultation        Cardiovascular negative cardio ROS  Rhythm:Regular Rate:Normal     Neuro/Psych    GI/Hepatic Neg liver ROS, GERD-  ,  Endo/Other    Renal/GU negative Renal ROS     Musculoskeletal   Abdominal   Peds  Hematology   Anesthesia Other Findings   Reproductive/Obstetrics                         Anesthesia Physical Anesthesia Plan  ASA: III  Anesthesia Plan: MAC   Post-op Pain Management:    Induction:   Airway Management Planned: Nasal Cannula  Additional Equipment:   Intra-op Plan:   Post-operative Plan:   Informed Consent: I have reviewed the patients History and Physical, chart, labs and discussed the procedure including the risks, benefits and alternatives for the proposed anesthesia with the patient or authorized representative who has indicated his/her understanding and acceptance.   Dental advisory given  Plan Discussed with: CRNA, Anesthesiologist and Surgeon  Anesthesia Plan Comments:        Anesthesia Quick Evaluation

## 2013-08-26 NOTE — H&P (Signed)
  HISTORY:  Patient is approximately 6 months status post distal pancreatectomy and splenectomy for pancreatic cancer. He was receiving Xeloda and radiation. He did not tolerate this well.  He is ready for port a cath removal.     PERTINENT REVIEW OF SYSTEMS:  Otherwise negative x 11.  Wt Readings from Last 3 Encounters:  08/26/13 201 lb 9.6 oz (91.445 kg)  08/26/13 201 lb 9.6 oz (91.445 kg)  07/29/13 203 lb 8 oz (92.307 kg)   Temp Readings from Last 3 Encounters:  08/26/13 97.6 F (36.4 C) Oral  08/26/13 97.6 F (36.4 C) Oral  07/29/13 98.4 F (36.9 C) Oral   BP Readings from Last 3 Encounters:  08/26/13 115/81  08/26/13 115/81  07/29/13 151/82   Pulse Readings from Last 3 Encounters:  08/26/13 70  08/26/13 70  07/29/13 60     EXAM:  Head: Normocephalic and atraumatic.  Eyes: Conjunctivae are normal. Pupils are equal, round, and reactive to light. No scleral icterus.  Neck: Normal range of motion. Neck supple. No tracheal deviation present. No thyromegaly present.  Chest wall:  Port in place on left.   Resp: No respiratory distress, normal effort.  Abd: Abdomen is soft, non distended and non tender. No masses are palpable. There is no rebound and no guarding. No hernia at incision.  Neurological: Alert and oriented to person, place, and time. Coordination normal.  Skin: Skin is warm and dry. No rash noted. No diaphoretic. No erythema. No pallor.  Psychiatric: Normal mood and affect. Normal behavior. Judgment and thought content normal.   ASSESSMENT AND PLAN:  Malignant neoplasm of tail of pancreas  We plan port a cath removal.  Pt doing well.  Asymptomatic with no clinical evidence of disease.  Reviewed risks of surgery including bleeding, infection, damage to adjacent structures.   Milus Height, MD  Surgical Oncology, [facility]    Leonides Grills, MD  Elsie Lincoln, MD

## 2013-08-27 ENCOUNTER — Encounter (HOSPITAL_BASED_OUTPATIENT_CLINIC_OR_DEPARTMENT_OTHER): Payer: Self-pay | Admitting: General Surgery

## 2013-10-16 ENCOUNTER — Telehealth: Payer: Self-pay | Admitting: Gastroenterology

## 2013-10-16 NOTE — Telephone Encounter (Signed)
Pt has recall colon due in 2016. Pt states he recently had pancreatic cancer and he wants to know if he should go ahead and have a colon done now. Pt was a [facility] I[facility] pt but had an EUS with Dr. Ardis Hughs. Dr. Ardis Hughs please advise if pt should go ahead with colon.

## 2013-10-17 NOTE — Telephone Encounter (Signed)
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  I reviewed letter from Dr. Sharlett Iles explaining that routine colon cancer screening is not due until 2016 for him.  He does not need colon cancer screening (colonoscopy) before then (and probably not even at that point given the known odds of surviving pancreatic cancer).  I am happy to discuss this with him in office if he would like.

## 2013-10-17 NOTE — Telephone Encounter (Signed)
Pt aware and recall in for 2016.

## 2013-10-31 ENCOUNTER — Telehealth: Payer: Self-pay | Admitting: Oncology

## 2013-10-31 ENCOUNTER — Telehealth: Payer: Self-pay | Admitting: *Deleted

## 2013-10-31 ENCOUNTER — Ambulatory Visit (HOSPITAL_COMMUNITY)
Admission: RE | Admit: 2013-10-31 | Discharge: 2013-10-31 | Disposition: A | Discharge status: Home / Self Care | Payer: BC Managed Care – PPO | Source: Ambulatory Visit | Attending: Oncology | Admitting: Oncology

## 2013-10-31 DIAGNOSIS — C259 Malignant neoplasm of pancreas, unspecified: Secondary | ICD-10-CM | POA: Insufficient documentation

## 2013-10-31 NOTE — Telephone Encounter (Signed)
Message copied by Domenic Schwab on Fri Oct 31, 2013  5:06 PM ------      Message from: Ladell Pier      Created: Fri Oct 31, 2013  5:02 PM       Please call patient, the chest lesion is  Better, likely scarring, schedule f/u appt. 3-4 months ------

## 2013-10-31 NOTE — Telephone Encounter (Signed)
Pt called and r/s appt for5/18 to June 2015, nurse notified

## 2013-10-31 NOTE — Telephone Encounter (Signed)
Gave pt appt for lab and Md for 5/19

## 2013-10-31 NOTE — Telephone Encounter (Signed)
Left voice message on known voicemail per MD; chest lesion is better, likely scarring and schedule f/u appt in 3-4 months.  Any questions to call MD office.

## 2013-11-03 ENCOUNTER — Ambulatory Visit: Payer: BC Managed Care – PPO | Admitting: Oncology

## 2013-11-03 ENCOUNTER — Other Ambulatory Visit: Payer: BC Managed Care – PPO

## 2013-11-04 ENCOUNTER — Other Ambulatory Visit: Payer: BC Managed Care – PPO

## 2013-11-04 ENCOUNTER — Ambulatory Visit (HOSPITAL_BASED_OUTPATIENT_CLINIC_OR_DEPARTMENT_OTHER): Payer: BC Managed Care – PPO | Admitting: Oncology

## 2013-11-04 VITALS — BP 116/75 | HR 58 | Temp 97.2°F | Resp 18 | Ht 71.0 in | Wt 205.9 lb

## 2013-11-04 DIAGNOSIS — C259 Malignant neoplasm of pancreas, unspecified: Secondary | ICD-10-CM

## 2013-11-04 DIAGNOSIS — C252 Malignant neoplasm of tail of pancreas: Secondary | ICD-10-CM

## 2013-11-04 DIAGNOSIS — M79609 Pain in unspecified limb: Secondary | ICD-10-CM

## 2013-11-04 NOTE — Progress Notes (Signed)
  [facility] OFFICE PROGRESS NOTE   Diagnosis: Pancreas cancer  INTERVAL HISTORY:   Jon Pena returns as scheduled. He feels well. Good appetite and energy level. He plays golf frequently. He relates bilateral hand discomfort to golf.  Objective:  Vital signs in last 24 hours:  Blood pressure 116/75, pulse 58, temperature 97.2 F (36.2 C), temperature source Oral, resp. rate 18, height 5\' 11"  (1.803 m), weight 205 lb 14.4 oz (93.396 kg).    HEENT: Neck without mass Lymphatics: No cervical, supra-clavicular, axillary, or inguinal nodes-probable lipoma at the right lateral inguinal canal Resp: Lungs clear bilaterally Cardio: Regular rate and rhythm GI: No hepatosplenomegaly , nontender, no mass Vascular: No leg edema   Lab Results: CA 19-9 pending   Imaging: CT of the chest 10/31/2013, compared to 08/04/2013-right middle lobe scarring, less nodular than previously, and adjacent subpleural nodular density has resolved. No new pulmonary mass, nodule, or consolidation. Low-density pancreas mid body lesion is again identified  Medications: I have reviewed the patient's current medications.  Assessment/Plan: 1. 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 Adjuvant gemcitabine 03/27/2013, 04/03/2013 and 04/10/2013.  Initiation of radiation and capecitabine 04/28/2013. Discontinued 05/22/2013 secondary to nausea.  Adjuvant gemcitabine resumed 06/17/2013. Last gemcitabine given 07/22/2013. 2. Left renal cyst with a mural nodular component on MRI 02/20/2013. Indeterminate. 3. Acute appendicitis status post laparoscopic appendectomy 01/26/2013. 4. Port-A-Cath placement 03/21/2013, removed 08/26/2013. 5. Right mid to low back sebaceous cyst.  6. Rosacea rash over the face. 7. History of tobacco use  8. Screening chest CT 08/05/2013 revealed masslike and nodular opacities in the medial segment  of the right middle lobe and in the inferior aspect of the lingula, followup chest CT 10/31/2013 revealed improvement 9. Cystic-appearing lesion in the body of the pancreas adjacent to the resection line noted on the CT 08/05/2013 and 11/01/2011-discussed with Dr. Barry Dienes, felt to represent postoperative change   Disposition:  Mr. Culbreath is in remission from pancreas cancer. We will followup on the CA 19-9 from today. He will return for an office visit in 4 months.  Ladell Pier, MD  11/04/2013  12:02 PM

## 2013-11-05 ENCOUNTER — Encounter: Payer: Self-pay | Admitting: *Deleted

## 2013-11-05 LAB — CANCER ANTIGEN 19-9: CA 19-9: 4.8 U/mL (ref <35.0)

## 2013-11-05 NOTE — Progress Notes (Signed)
Called lab to add CEA to testing.

## 2013-11-06 ENCOUNTER — Telehealth: Payer: Self-pay | Admitting: Oncology

## 2013-11-06 LAB — CEA: CEA: 3 ng/mL (ref 0.0–5.0)

## 2013-11-06 NOTE — Telephone Encounter (Signed)
Mailed the pt his sept 2015 appt calendar

## 2013-11-11 ENCOUNTER — Encounter: Payer: Self-pay | Admitting: Cardiology

## 2013-11-11 ENCOUNTER — Ambulatory Visit (INDEPENDENT_AMBULATORY_CARE_PROVIDER_SITE_OTHER): Payer: BC Managed Care – PPO | Admitting: Cardiology

## 2013-11-11 VITALS — BP 138/88 | HR 52 | Ht 71.0 in | Wt 209.0 lb

## 2013-11-11 DIAGNOSIS — I2510 Atherosclerotic heart disease of native coronary artery without angina pectoris: Secondary | ICD-10-CM | POA: Insufficient documentation

## 2013-11-11 DIAGNOSIS — C252 Malignant neoplasm of tail of pancreas: Secondary | ICD-10-CM

## 2013-11-11 NOTE — Patient Instructions (Signed)
Your physician recommends that you schedule a follow-up appointment in: to be determined after your stress test.We will call you with results   Your physician recommends that you continue on your current medications as directed. Please refer to the Current Medication list given to you today.    Your physician has requested that you have a stress echocardiogram. For further information please visit [email or web address]. Please follow instruction sheet as given.    Thank you for choosing [facility] !

## 2013-11-11 NOTE — Assessment & Plan Note (Signed)
Noted incidentally by recent screening chest CT done for other followup purposes. ECG shows sinus bradycardia. He does not report any exertional chest pain, stable functional capacity with ADLs, regular golf and intermittent exercise at the gym. He does report mildly elevated cholesterol levels over time. Plan is to obtain an exercise echocardiogram for objective ischemic evaluation, he reports no prior stress testing. This will serve as a baseline:going forward. I did recommend that he followup with his primary care provider and consider more aggressive management of cholesterol. Office will call him with the results of this testing.

## 2013-11-11 NOTE — Assessment & Plan Note (Signed)
Followed by Oncology, in remission at this point.

## 2013-11-11 NOTE — Addendum Note (Signed)
Addended by: Truett Mainland on: 11/11/2013 12:54 PM   Modules accepted: Orders

## 2013-11-11 NOTE — Progress Notes (Signed)
Clinical Summary [name] is a 63 y.o.male referred for cardiology consultation by Dr. Orson Ape. History is noted below. Recent chest CT scan completed in May incidentally noted moderate atheromatous coronary arterial calcification, no pericardial effusion. He is referred to discuss this.  He reports no personal history of nonobstructive CAD or myocardial infarction. Symptomatically, he has been stable with NYHA class 1-2 dyspnea with typical activities including playing golf regularly. He also goes to the gym occasionally, does not report any exertional chest tightness or palpitations.  ECG today shows sinus bradycardia.  Lab work from January showed potassium 4.4, BUN 23, creatinine 1.0, normal AST and ALT, hemoglobin 14.9, platelets 327. He tells me that lipids have been followed by primary care, reports total cholesterol around "220," does not sound like LDL has been under 100. He has preferred to hold off on taking statin medications..   No Known Allergies  Current Outpatient Prescriptions  Medication Sig Dispense Refill  . Blood Glucose Monitoring Suppl (BLOOD GLUCOSE METER) kit Use as directed  1 each  0  . Glucose Blood (BLOOD GLUCOSE TEST STRIPS) STRP 1 strip by Subdermal route 2 (two) times daily.  60 each  0  . naproxen sodium (ANAPROX) 220 MG tablet Take 220 mg by mouth as needed. Takes 2 prn when he plays golf       No current facility-administered medications for this visit.    Past Medical History  Diagnosis Date  . GERD (gastroesophageal reflux disease)   . Pancreatic adenocarcinoma 02/27/13    Stage IIB (T3 N1) status post distal pancreatectomy/splenectomy, status post chemotherapy and XRT  . HOH (hard of hearing)     Past Surgical History  Procedure Laterality Date  . Laparoscopic appendectomy N/A 01/26/2013    Procedure: APPENDECTOMY LAPAROSCOPIC;  Surgeon: Zenovia Jarred, MD;  Location: [facility];  Service: General;  Laterality: N/A;  . Appendectomy    . Eus  N/A 02/20/2013    Procedure: UPPER ENDOSCOPIC ULTRASOUND (EUS) LINEAR;  Surgeon: Milus Banister, MD;  Location: [facility];  Service: Endoscopy;  Laterality: N/A;  . Pancreatectomy N/A 02/27/2013    Procedure: LAPAROSCOPIC PANCREATECTOMY;  Surgeon: Stark Klein, MD;  Location: [facility];  Service: General;  Laterality: N/A;  . Laparoscopic splenectomy N/A 02/27/2013    Procedure: LAPAROSCOPIC SPLENECTOMY;  Surgeon: Stark Klein, MD;  Location: [facility];  Service: General;  Laterality: N/A;  . Tonsillectomy    . Portacath placement N/A 03/21/2013    Procedure: INSERTION PORT-A-CATH;  Surgeon: Stark Klein, MD;  Location: [facility];  Service: General;  Laterality: N/A;  . Port-a-cath removal Left 08/26/2013    Procedure: REMOVAL PORT-A-CATH;  Surgeon: Stark Klein, MD;  Location: [facility];  Service: General;  Laterality: Left;    Family History  Problem Relation Age of Onset  . Breast cancer Paternal Aunt     Social History Mr. Rimel reports that he quit smoking about 8 months ago. His smoking use included Cigarettes. He has a 20 pack-year smoking history. He has never used smokeless tobacco. Mr. Buttery reports that he does not drink alcohol.  Review of Systems No palpitations, dizziness, syncope. Has had some hand pain with playing golf. No claudication symptoms. No orthopnea or PND. Otherwise negative.  Physical Examination Filed Vitals:   11/11/13 0917  BP: 138/88  Pulse: 52   Filed Weights   11/11/13 0917  Weight: 209 lb (94.802 kg)   Patient appears comfortable at rest. HEENT: Conjunctiva and lids  normal, oropharynx clear. Neck: Supple, no elevated JVP or carotid bruits, no thyromegaly. Lungs: Clear to auscultation, nonlabored breathing at rest. Cardiac: Regular rate and rhythm, no S3 or significant systolic murmur, no pericardial rub. Abdomen: Soft, nontender, bowel sounds present. Extremities: No pitting edema, distal pulses 2+. Skin: Warm and  dry. Musculoskeletal: No kyphosis. Neuropsychiatric: Alert and oriented x3, affect grossly appropriate.   Problem List and Plan   Coronary atherosclerosis Noted incidentally by recent screening chest CT done for other followup purposes. ECG shows sinus bradycardia. He does not report any exertional chest pain, stable functional capacity with ADLs, regular golf and intermittent exercise at the gym. He does report mildly elevated cholesterol levels over time. Plan is to obtain an exercise echocardiogram for objective ischemic evaluation, he reports no prior stress testing. This will serve as a baseline:going forward. I did recommend that he followup with his primary care provider and consider more aggressive management of cholesterol. Office will call him with the results of this testing.  Malignant neoplasm of tail of pancreas Followed by Oncology, in remission at this point.    Satira Sark, M.D., F.A.C.C.

## 2013-11-13 ENCOUNTER — Ambulatory Visit (HOSPITAL_COMMUNITY)
Admission: RE | Admit: 2013-11-13 | Discharge: 2013-11-13 | Disposition: A | Discharge status: Home / Self Care | Payer: BC Managed Care – PPO | Source: Ambulatory Visit | Attending: Cardiology | Admitting: Cardiology

## 2013-11-13 ENCOUNTER — Encounter (HOSPITAL_COMMUNITY): Payer: Self-pay

## 2013-11-13 DIAGNOSIS — Z87891 Personal history of nicotine dependence: Secondary | ICD-10-CM | POA: Insufficient documentation

## 2013-11-13 DIAGNOSIS — I2510 Atherosclerotic heart disease of native coronary artery without angina pectoris: Secondary | ICD-10-CM

## 2013-11-13 DIAGNOSIS — I498 Other specified cardiac arrhythmias: Secondary | ICD-10-CM | POA: Insufficient documentation

## 2013-11-13 NOTE — Progress Notes (Signed)
Stress Lab Nurses Notes - [facility] 11/13/2013 Reason for doing test: Coronary Atherosclerosis Type of test: Stress Echo Nurse performing test: Gerrit Halls, RN Nuclear Medicine Tech: Not Applicable Echo Tech: Jamison Neighbor MD performing test: S. McDowell/K.Purcell Nails NP Family MD: McGough Test explained and consent signed: yes IV started: No IV started Symptoms: slight SOB Treatment/Intervention: None Reason test stopped: fatigue After recovery IV was: NA Patient to return to [facility]. Med at : NA Patient discharged: Home Patient's Condition upon discharge was: stable Comments: During test peak BP 181/77 & HR 146 .  Recovery BP 143/80 & HR 92.  Symptoms resolved in recovery. Jon Pena

## 2013-11-13 NOTE — Progress Notes (Signed)
  Echocardiogram 2D Echocardiogram has been performed.  Jon Pena 11/13/2013, 10:11 AM

## 2013-11-27 ENCOUNTER — Other Ambulatory Visit: Payer: BC Managed Care – PPO

## 2013-11-27 ENCOUNTER — Ambulatory Visit: Payer: BC Managed Care – PPO | Admitting: Oncology

## 2014-03-05 ENCOUNTER — Ambulatory Visit (HOSPITAL_BASED_OUTPATIENT_CLINIC_OR_DEPARTMENT_OTHER): Payer: BC Managed Care – PPO | Admitting: Oncology

## 2014-03-05 ENCOUNTER — Other Ambulatory Visit (HOSPITAL_BASED_OUTPATIENT_CLINIC_OR_DEPARTMENT_OTHER): Payer: BC Managed Care – PPO

## 2014-03-05 ENCOUNTER — Telehealth: Payer: Self-pay | Admitting: Oncology

## 2014-03-05 VITALS — BP 133/68 | HR 55 | Temp 98.6°F | Resp 18 | Ht 71.0 in | Wt 213.2 lb

## 2014-03-05 DIAGNOSIS — C259 Malignant neoplasm of pancreas, unspecified: Secondary | ICD-10-CM

## 2014-03-05 DIAGNOSIS — N281 Cyst of kidney, acquired: Secondary | ICD-10-CM

## 2014-03-05 DIAGNOSIS — L723 Sebaceous cyst: Secondary | ICD-10-CM

## 2014-03-05 DIAGNOSIS — K8689 Other specified diseases of pancreas: Secondary | ICD-10-CM

## 2014-03-05 DIAGNOSIS — Z8509 Personal history of malignant neoplasm of other digestive organs: Secondary | ICD-10-CM

## 2014-03-05 DIAGNOSIS — Z87891 Personal history of nicotine dependence: Secondary | ICD-10-CM

## 2014-03-05 LAB — CBC WITH DIFFERENTIAL/PLATELET
BASO%: 0.6 % (ref 0.0–2.0)
BASOS ABS: 0.1 10*3/uL (ref 0.0–0.1)
EOS%: 4.9 % (ref 0.0–7.0)
Eosinophils Absolute: 0.4 10*3/uL (ref 0.0–0.5)
HEMATOCRIT: 46.8 % (ref 38.4–49.9)
HEMOGLOBIN: 15.4 g/dL (ref 13.0–17.1)
LYMPH#: 2.9 10*3/uL (ref 0.9–3.3)
LYMPH%: 33.6 % (ref 14.0–49.0)
MCH: 30.4 pg (ref 27.2–33.4)
MCHC: 32.9 g/dL (ref 32.0–36.0)
MCV: 92.3 fL (ref 79.3–98.0)
MONO#: 0.9 10*3/uL (ref 0.1–0.9)
MONO%: 10.4 % (ref 0.0–14.0)
NEUT%: 50.5 % (ref 39.0–75.0)
NEUTROS ABS: 4.4 10*3/uL (ref 1.5–6.5)
Platelets: 277 10*3/uL (ref 140–400)
RBC: 5.07 10*6/uL (ref 4.20–5.82)
RDW: 14.3 % (ref 11.0–14.6)
WBC: 8.7 10*3/uL (ref 4.0–10.3)

## 2014-03-05 LAB — CANCER ANTIGEN 19-9: CA 19-9: <1.2 U/mL (ref <35.0)

## 2014-03-05 NOTE — Telephone Encounter (Signed)
Pt confirmed labs/ov per 09/17 POF, spk w/C[facility] confirmed apt for referral w/Dr. Charlestine Night, gave pt AVS..Marland KitchenKJ

## 2014-03-05 NOTE — Progress Notes (Signed)
  [facility] OFFICE PROGRESS NOTE   Diagnosis: Pancreas cancer  INTERVAL HISTORY:   Jon Pena returns as scheduled. He feels well. Good appetite. He is golfing regularly. He reports a few episodes of transient upper abdominal pain. This is not a consistent symptom. He complains of pain in the hands and believes he may be developing arthritis.  Objective:  Vital signs in last 24 hours:  Blood pressure 133/68, pulse 55, temperature 98.6 F (37 C), temperature source Oral, resp. rate 18, height 5\' 11"  (1.803 m), weight 213 lb 3.2 oz (96.707 kg).    HEENT: Neck without mass Lymphatics: No cervical, supra-clavicular, axillary, or inguinal nodes Resp: Lungs clear bilaterally Cardio: Regular rate and rhythm GI: No hepatosplenomegaly, nontender, no mass Vascular: No leg edema Musculoskeletal: There is mild synovial thickening at the PIP and DIP joints bilaterally. There are nodular growths at the DIP of several fingers       Lab Results:  Lab Results  Component Value Date   WBC 8.7 03/05/2014   HGB 15.4 03/05/2014   HCT 46.8 03/05/2014   MCV 92.3 03/05/2014   PLT 277 03/05/2014   NEUTROABS 4.4 03/05/2014   CA 19-9 on 11/04/2013-4.8  CA 19-9 pending Medications: I have reviewed the patient's current medications.  Assessment/Plan: 1. 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 Adjuvant gemcitabine 03/27/2013, 04/03/2013 and 04/10/2013.  Initiation of radiation and capecitabine 04/28/2013. Discontinued 05/22/2013 secondary to nausea.  Adjuvant gemcitabine resumed 06/17/2013. Last gemcitabine given 07/22/2013. 2. Left renal cyst with a mural nodular component on MRI 02/20/2013. Indeterminate. 3. Acute appendicitis status post laparoscopic appendectomy 01/26/2013. 4. Port-A-Cath placement 03/21/2013, removed 08/26/2013. 5. Right mid to low back sebaceous cyst.  6. Rosacea rash over the  face. 7. History of tobacco use  8. Screening chest CT 08/05/2013 revealed masslike and nodular opacities in the medial segment of the right middle lobe and in the inferior aspect of the lingula, followup chest CT 10/31/2013 revealed improvement 9. Cystic-appearing lesion in the body of the pancreas adjacent to the resection line noted on the CT 08/05/2013 and 10/31/2013-discussed with Dr. Barry Dienes, felt to represent postoperative change  Disposition:  He remains in clinical remission from pancreas cancer. We will followup on the CA19-9 from today. He will return for an office visit in 4 months.  We will make a referral to rheumatology for evaluation of the hand symptoms.  Jon Pena declined an influenza vaccine.  Jon Coder, MD  03/05/2014  9:23 AM

## 2014-03-06 ENCOUNTER — Telehealth: Payer: Self-pay | Admitting: *Deleted

## 2014-03-06 NOTE — Telephone Encounter (Signed)
Message copied by Brien Few on Fri Mar 06, 2014  4:52 PM ------      Message from: Betsy Coder B      Created: Thu Mar 05, 2014  9:51 PM       Please call patient, ca19-9 is normal ------

## 2014-03-06 NOTE — Telephone Encounter (Signed)
Left message on voicemail informing pt that lab was normal and is available on Mychart.

## 2014-03-19 ENCOUNTER — Encounter: Payer: Self-pay | Admitting: Gastroenterology

## 2014-05-04 ENCOUNTER — Ambulatory Visit (AMBULATORY_SURGERY_CENTER): Payer: Self-pay | Admitting: *Deleted

## 2014-05-04 VITALS — Ht 71.0 in | Wt 215.4 lb

## 2014-05-04 DIAGNOSIS — Z86010 Personal history of colonic polyps: Secondary | ICD-10-CM

## 2014-05-04 MED ORDER — MOVIPREP 100 G PO SOLR: 1.0000 | Freq: Once | ORAL | Status: DC

## 2014-05-04 NOTE — Progress Notes (Signed)
No egg or soy allergy. ewm No home 02 use. ewm No diet pills. ewm No issues with past sedation/ ewm

## 2014-05-05 ENCOUNTER — Encounter: Payer: Self-pay | Admitting: Gastroenterology

## 2014-05-19 ENCOUNTER — Encounter: Payer: Self-pay | Admitting: Gastroenterology

## 2014-05-19 ENCOUNTER — Ambulatory Visit (AMBULATORY_SURGERY_CENTER): Payer: BC Managed Care – PPO | Admitting: Gastroenterology

## 2014-05-19 VITALS — BP 106/78 | HR 56 | Temp 97.2°F | Resp 16 | Ht 71.0 in | Wt 215.0 lb

## 2014-05-19 DIAGNOSIS — D128 Benign neoplasm of rectum: Secondary | ICD-10-CM

## 2014-05-19 DIAGNOSIS — K621 Rectal polyp: Secondary | ICD-10-CM

## 2014-05-19 DIAGNOSIS — Z86010 Personal history of colonic polyps: Secondary | ICD-10-CM

## 2014-05-19 DIAGNOSIS — D129 Benign neoplasm of anus and anal canal: Secondary | ICD-10-CM

## 2014-05-19 MED ORDER — SODIUM CHLORIDE 0.9 % IV SOLN: 500.0000 mL | INTRAVENOUS | Status: DC

## 2014-05-19 NOTE — Patient Instructions (Signed)
YOU HAD AN ENDOSCOPIC PROCEDURE TODAY AT THE [facility]: Refer to the procedure report that was given to you for any specific questions about what was found during the examination.  If the procedure report does not answer your questions, please call your gastroenterologist to clarify.  If you requested that your care partner not be given the details of your procedure findings, then the procedure report has been included in a sealed envelope for you to review at your convenience later.  YOU SHOULD EXPECT: Some feelings of bloating in the abdomen. Passage of more gas than usual.  Walking can help get rid of the air that was put into your GI tract during the procedure and reduce the bloating. If you had a lower endoscopy (such as a colonoscopy or flexible sigmoidoscopy) you may notice spotting of blood in your stool or on the toilet paper. If you underwent a bowel prep for your procedure, then you may not have a normal bowel movement for a few days.  DIET: Your first meal following the procedure should be a light meal and then it is ok to progress to your normal diet.  A half-sandwich or bowl of soup is an example of a good first meal.  Heavy or fried foods are harder to digest and may make you feel nauseous or bloated.  Likewise meals heavy in dairy and vegetables can cause extra gas to form and this can also increase the bloating.  Drink plenty of fluids but you should avoid alcoholic beverages for 24 hours.  ACTIVITY: Your care partner should take you home directly after the procedure.  You should plan to take it easy, moving slowly for the rest of the day.  You can resume normal activity the day after the procedure however you should NOT DRIVE or use heavy machinery for 24 hours (because of the sedation medicines used during the test).    SYMPTOMS TO REPORT IMMEDIATELY: A gastroenterologist can be reached at any hour.  During normal business hours, 8:30 AM to 5:00 PM Monday through Friday,  call [phone].  After hours and on weekends, please call the GI answering service at [phone] who will take a message and have the physician on call contact you.   Following lower endoscopy (colonoscopy or flexible sigmoidoscopy):  Excessive amounts of blood in the stool  Significant tenderness or worsening of abdominal pains  Swelling of the abdomen that is new, acute  Fever of 100F or higher  FOLLOW UP: If any biopsies were taken you will be contacted by phone or by letter within the next 1-3 weeks.  Call your gastroenterologist if you have not heard about the biopsies in 3 weeks.  Our staff will call the home number listed on your records the next business day following your procedure to check on you and address any questions or concerns that you may have at that time regarding the information given to you following your procedure. This is a courtesy call and so if there is no answer at the home number and we have not heard from you through the emergency physician on call, we will assume that you have returned to your regular daily activities without incident.  SIGNATURES/CONFIDENTIALITY: You and/or your care partner have signed paperwork which will be entered into your electronic medical record.  These signatures attest to the fact that that the information above on your After Visit Summary has been reviewed and is understood.  Full responsibility of the confidentiality of this   discharge information lies with you and/or your care-partner.    Resume medications. Information given on polyps with discharge instructions. 

## 2014-05-19 NOTE — Op Note (Signed)
[facility]  [address] [place], [address]   COLONOSCOPY PROCEDURE REPORT  PATIENT: Jon Pena, Jon Pena  MR#: [id number] BIRTHDATE: 19-Nov-1950 , 63  yrs. old GENDER: male ENDOSCOPIST: Milus Banister, MD PROCEDURE DATE:  05/19/2014 PROCEDURE:   Colonoscopy with snare polypectomy First Screening Colonoscopy - Avg.  risk and is 50 yrs.  old or older - No.  Prior Negative Screening - Now for repeat screening. 10 or more years since last screening  History of Adenoma - Now for follow-up colonoscopy & has been > or = to 3 yrs.  N/A  Polyps Removed Today? Yes. ASA CLASS:   Class II INDICATIONS:average risk for colon cancer (colonoscopy 10 years ago Dr. Sharlett Iles, no precancerous polyps). MEDICATIONS: Monitored anesthesia care and Propofol 200 mg IV  DESCRIPTION OF PROCEDURE:   After the risks benefits and alternatives of the procedure were thoroughly explained, informed consent was obtained.  The digital rectal exam revealed no abnormalities of the rectum.   The [id number]  endoscope was introduced through the anus and advanced to the cecum, which was identified by both the appendix and ileocecal valve. No adverse events experienced.   The quality of the prep was excellent.  The instrument was then slowly withdrawn as the colon was fully examined.   COLON FINDINGS: A sessile polyp measuring 3 mm in size was found in the rectum.  A polypectomy was performed with a cold snare.  The resection was complete, the polyp tissue was completely retrieved and sent to histology.   The examination was otherwise normal. Retroflexed views revealed no abnormalities. The time to cecum=2 minutes 06 seconds.  Withdrawal time=12 minutes 36 seconds.  The scope was withdrawn and the procedure completed. COMPLICATIONS: There were no immediate complications.  ENDOSCOPIC IMPRESSION: 1.   Sessile polyp was found in the rectum; polypectomy was performed with a cold snare 2.   The  examination was otherwise normal  RECOMMENDATIONS: If the polyp(s) removed today are proven to be adenomatous (pre-cancerous) polyps, you will need a repeat colonoscopy in 5 years.  Otherwise you should continue to follow colorectal cancer screening guidelines for "routine risk" patients with colonoscopy in 10 years.  You will receive a letter within 1-2 weeks with the results of your biopsy as well as final recommendations.  Please call my office if you have not received a letter after 3 weeks.  eSigned:  Milus Banister, MD 05/19/2014 9:02 AM

## 2014-05-19 NOTE — Progress Notes (Signed)
Called to room to assist during endoscopic procedure.  Patient ID and intended procedure confirmed with present staff. Received instructions for my participation in the procedure from the performing physician.Called to room to assist during endoscopic procedure.  Patient ID and intended procedure confirmed with present staff. Received instructions for my participation in the procedure from the performing physician. 

## 2014-05-19 NOTE — Progress Notes (Signed)
Report to PACU, RN, vss, BBS= Clear.  

## 2014-05-20 ENCOUNTER — Telehealth: Payer: Self-pay | Admitting: *Deleted

## 2014-05-20 NOTE — Telephone Encounter (Signed)
  Follow up Call-  Call back number 05/19/2014  Post procedure Call Back phone  # [phone]  Permission to leave phone message Yes     Patient questions:  Do you have a fever, pain , or abdominal swelling? No. Pain Score  0 *  Have you tolerated food without any problems? Yes.    Have you been able to return to your normal activities? Yes.    Do you have any questions about your discharge instructions: Diet   No. Medications  No. Follow up visit  No.  Do you have questions or concerns about your Care? No.  Actions: * If pain score is 4 or above: No action needed, pain <4.

## 2014-05-27 ENCOUNTER — Encounter: Payer: Self-pay | Admitting: Gastroenterology

## 2014-07-06 ENCOUNTER — Other Ambulatory Visit (HOSPITAL_BASED_OUTPATIENT_CLINIC_OR_DEPARTMENT_OTHER): Payer: BLUE CROSS/BLUE SHIELD

## 2014-07-06 ENCOUNTER — Ambulatory Visit (HOSPITAL_BASED_OUTPATIENT_CLINIC_OR_DEPARTMENT_OTHER): Payer: BLUE CROSS/BLUE SHIELD | Admitting: Oncology

## 2014-07-06 ENCOUNTER — Telehealth: Payer: Self-pay | Admitting: Oncology

## 2014-07-06 VITALS — BP 127/82 | HR 57 | Temp 98.2°F | Resp 18 | Ht 71.0 in | Wt 211.4 lb

## 2014-07-06 DIAGNOSIS — Z87891 Personal history of nicotine dependence: Secondary | ICD-10-CM

## 2014-07-06 DIAGNOSIS — C259 Malignant neoplasm of pancreas, unspecified: Secondary | ICD-10-CM

## 2014-07-06 DIAGNOSIS — L723 Sebaceous cyst: Secondary | ICD-10-CM

## 2014-07-06 DIAGNOSIS — Z8507 Personal history of malignant neoplasm of pancreas: Secondary | ICD-10-CM

## 2014-07-06 LAB — CANCER ANTIGEN 19-9: CA 19-9: 3.8 U/mL (ref <35.0)

## 2014-07-06 NOTE — Telephone Encounter (Signed)
Pt confirmed labs/ov per 01/18 POF, gave pt AVS... KJ

## 2014-07-06 NOTE — Addendum Note (Signed)
Addended by: Tania Ade on: 07/06/2014 09:34 AM   Modules accepted: Medications

## 2014-07-06 NOTE — Progress Notes (Signed)
  [place] OFFICE PROGRESS NOTE   Diagnosis: Pancreas Caner  INTERVAL HISTORY:   He returns as scheduled. He feels well. Good appetite and energy level. The hand discomfort has improved. He is no longer smoking.  Objective:  Vital signs in last 24 hours:  Blood pressure 127/82, pulse 57, temperature 98.2 F (36.8 C), temperature source Oral, resp. rate 18, height 5\' 11"  (1.803 m), weight 211 lb 6.4 oz (95.89 kg), SpO2 99 %.    HEENT: Neck without mass Lymphatics: No cervical, supra-clavicular, axillary, or inguinal nodes Resp: Lungs clear bilaterally Cardio: Regular rate and rhythm GI: No hepatomegaly, nontender, no mass Vascular: No leg edema   Lab Results:  CA-19-9 pending  Medications: I have reviewed the patient's current medications.  Assessment/Plan: 1. 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  Adjuvant gemcitabine 03/27/2013, 04/03/2013 and 04/10/2013.   Initiation of radiation and capecitabine 04/28/2013. Discontinued 05/22/2013 secondary to nausea.   Adjuvant gemcitabine resumed 06/17/2013. Last gemcitabine given 07/22/2013. 2. Left renal cyst with a mural nodular component on MRI 02/20/2013. Indeterminate. 3. Acute appendicitis status post laparoscopic appendectomy 01/26/2013. 4. Port-A-Cath placement 03/21/2013, removed 08/26/2013. 5. Right mid to low back sebaceous cyst.  6. History of Rosacea rash over the face. 7. History of tobacco use  8. Screening chest CT 08/05/2013 revealed masslike and nodular opacities in the medial segment of the right middle lobe and in the inferior aspect of the lingula, followup chest CT 10/31/2013 revealed improvement 9. Cystic-appearing lesion in the body of the pancreas adjacent to the resection line noted on the CT 08/05/2013 and 10/31/2013-discussed with Dr. Barry Dienes, felt to represent postoperative  change   Disposition:  Jon Pena remains in clinical remission from pancreas cancer. We will follow-up on the CA 19-9 from today. He will return for an office visit in 6 months.  Betsy Coder, MD  07/06/2014  8:55 AM

## 2014-07-07 ENCOUNTER — Telehealth: Payer: Self-pay

## 2014-07-07 NOTE — Telephone Encounter (Signed)
-----   Message from Tania Ade, RN sent at 07/07/2014  3:18 PM EST -----   ----- Message -----    From: Ladell Pier, MD    Sent: 07/06/2014   7:01 PM      To: Tania Ade, RN, Ludwig Lean, RN, #  Please call patient, ca19-9 is normal

## 2014-07-07 NOTE — Telephone Encounter (Signed)
Called and spoke with [facility], pt mother, informed her of pt results. States she wrote it down for pt. Told her to have pt call us of he had any questions or concerns. Left message on pt cell phone as well.

## 2015-01-04 ENCOUNTER — Telehealth: Payer: Self-pay | Admitting: Oncology

## 2015-01-04 ENCOUNTER — Other Ambulatory Visit (HOSPITAL_BASED_OUTPATIENT_CLINIC_OR_DEPARTMENT_OTHER): Payer: BLUE CROSS/BLUE SHIELD

## 2015-01-04 ENCOUNTER — Ambulatory Visit (HOSPITAL_BASED_OUTPATIENT_CLINIC_OR_DEPARTMENT_OTHER): Payer: BLUE CROSS/BLUE SHIELD | Admitting: Oncology

## 2015-01-04 VITALS — BP 128/73 | HR 53 | Temp 98.0°F | Resp 19 | Ht 71.0 in | Wt 201.6 lb

## 2015-01-04 DIAGNOSIS — Z8507 Personal history of malignant neoplasm of pancreas: Secondary | ICD-10-CM | POA: Diagnosis not present

## 2015-01-04 DIAGNOSIS — C259 Malignant neoplasm of pancreas, unspecified: Secondary | ICD-10-CM

## 2015-01-04 DIAGNOSIS — R109 Unspecified abdominal pain: Secondary | ICD-10-CM | POA: Diagnosis not present

## 2015-01-04 LAB — CANCER ANTIGEN 19-9: CA 19 9: 1.3 U/mL (ref <35.0)

## 2015-01-04 NOTE — Telephone Encounter (Signed)
S/w pt confirming labs/ov per 07/18 POF, mailed schedule to pt... KJ

## 2015-01-04 NOTE — Progress Notes (Signed)
  [place] OFFICE PROGRESS NOTE   Diagnosis: Pancreas cancer  INTERVAL HISTORY:   Jon Pena returns as scheduled. He feels well. He golfs frequently. Good appetite. He reports left upper abdominal discomfort described as a dull ache for the past 3 weeks. The discomfort is better today. No nausea or vomiting. No difficulty with bowel or bladder function. He has tenderness at the low anterior left chest wall.  Objective:  Vital signs in last 24 hours:  Blood pressure 128/73, pulse 53, temperature 98 F (36.7 C), temperature source Oral, resp. rate 19, height 5\' 11"  (1.803 m), weight 201 lb 9.6 oz (91.445 kg), SpO2 99 %.    HEENT: Neck without mass Lymphatics: No cervical, supraclavicular, axillary, or inguinal nodes Resp: Lungs clear bilaterally Cardio: Regular rate and rhythm GI: The abdomen is soft and nontender, no hepatosplenomegaly, no mass, no apparent ascites Vascular: No leg edema   Lab Results: 07/06/2014,CA 19-9: 3.8   Medications: I have reviewed the patient's current medications.  Assessment/Plan: 1. 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  Adjuvant gemcitabine 03/27/2013, 04/03/2013 and 04/10/2013.   Initiation of radiation and capecitabine 04/28/2013. Discontinued 05/22/2013 secondary to nausea.   Adjuvant gemcitabine resumed 06/17/2013. Last gemcitabine given 07/22/2013. 2. Left renal cyst with a mural nodular component on MRI 02/20/2013. Indeterminate. 3. Acute appendicitis status post laparoscopic appendectomy 01/26/2013. 4. Port-A-Cath placement 03/21/2013, removed 08/26/2013. 5. Right mid to low back sebaceous cyst.  6. History of Rosacea rash over the face. 7. History of tobacco use  8. Screening chest CT 08/05/2013 revealed masslike and nodular opacities in the medial segment of the right middle lobe and in the inferior aspect of the lingula,  followup chest CT 10/31/2013 revealed improvement 9. Cystic-appearing lesion in the body of the pancreas adjacent to the resection line noted on the CT 08/05/2013 and 10/31/2013-discussed with Dr. Barry Dienes, felt to represent postoperative change  Disposition:  Jon Pena remains in clinical remission from pancreas cancer. He will return for an office visit and CA 19-9 in 6 months. We will follow-up on the CA 19-9 from today.  He will contact us if the left upper abdominal discomfort does not improve. This may be related to a benign musculoskeletal condition or gastritis.  Betsy Coder, MD  01/04/2015  9:26 AM

## 2015-01-05 ENCOUNTER — Telehealth: Payer: Self-pay | Admitting: *Deleted

## 2015-01-05 NOTE — Telephone Encounter (Signed)
Discussed results with pt, voices understanding and appreciated call.

## 2015-01-05 NOTE — Telephone Encounter (Signed)
-----   Message from Ladell Pier, MD sent at 01/04/2015  5:42 PM EDT ----- Please call patient ca19-9 is normal

## 2015-01-07 ENCOUNTER — Telehealth: Payer: Self-pay | Admitting: *Deleted

## 2015-01-07 NOTE — Telephone Encounter (Signed)
Patient states he saw his PCP Dr. Hilma Favors and had a complete physical. Continues to have pancreatic pain of unknown etiology. MD suggested pt have CT or referral to Dr. Barry Dienes. Per Dr. Benay Spice, pt does not need a referral to see Dr Barry Dienes, as he has seen her already before. Pt voices understanding and will call for appt. Voices understanding to call us with any other concerns or trouble with getting an appt.

## 2015-07-06 ENCOUNTER — Ambulatory Visit (HOSPITAL_BASED_OUTPATIENT_CLINIC_OR_DEPARTMENT_OTHER): Payer: BLUE CROSS/BLUE SHIELD | Admitting: Oncology

## 2015-07-06 ENCOUNTER — Telehealth: Payer: Self-pay | Admitting: Oncology

## 2015-07-06 ENCOUNTER — Other Ambulatory Visit (HOSPITAL_BASED_OUTPATIENT_CLINIC_OR_DEPARTMENT_OTHER): Payer: BLUE CROSS/BLUE SHIELD

## 2015-07-06 VITALS — BP 112/71 | HR 56 | Temp 97.8°F | Resp 18 | Ht 71.0 in | Wt 202.4 lb

## 2015-07-06 DIAGNOSIS — C252 Malignant neoplasm of tail of pancreas: Secondary | ICD-10-CM | POA: Diagnosis not present

## 2015-07-06 DIAGNOSIS — Z8507 Personal history of malignant neoplasm of pancreas: Secondary | ICD-10-CM | POA: Diagnosis not present

## 2015-07-06 DIAGNOSIS — C259 Malignant neoplasm of pancreas, unspecified: Secondary | ICD-10-CM

## 2015-07-06 NOTE — Progress Notes (Signed)
  [place] OFFICE PROGRESS NOTE   Diagnosis: Pancreas cancer  INTERVAL HISTORY:   Jon Pena returns as scheduled. He feels well. Good appetite. He is exercising and playing golf. He has occasional mid abdominal pain after eating certain foods.  Objective:  Vital signs in last 24 hours:  Blood pressure 112/71, pulse 56, temperature 97.8 F (36.6 C), temperature source Oral, resp. rate 18, height 5\' 11"  (1.803 m), weight 202 lb 6.4 oz (91.808 kg), SpO2 95 %.    HEENT: Neck without mass Lymphatics: No cervical, supra-clavicular, axillary, or inguinal nodes Resp: Lungs clear bilaterally Cardio: Regular rate and rhythm GI: No hepatomegaly, no mass, nontender Vascular: No leg edema   Lab Results:  CA 19-9 pending  Medications: I have reviewed the patient's current medications.  Assessment/Plan: 1. 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  Adjuvant gemcitabine 03/27/2013, 04/03/2013 and 04/10/2013.   Initiation of radiation and capecitabine 04/28/2013. Discontinued 05/22/2013 secondary to nausea.   Adjuvant gemcitabine resumed 06/17/2013. Last gemcitabine given 07/22/2013. 2. Left renal cyst with a mural nodular component on MRI 02/20/2013. Indeterminate. 3. Acute appendicitis status post laparoscopic appendectomy 01/26/2013. 4. Port-A-Cath placement 03/21/2013, removed 08/26/2013. 5. Right mid to low back sebaceous cyst.  6. History of Rosacea rash over the face. 7. History of tobacco use  8. Screening chest CT 08/05/2013 revealed masslike and nodular opacities in the medial segment of the right middle lobe and in the inferior aspect of the lingula, followup chest CT 10/31/2013 revealed improvement 9. Cystic-appearing lesion in the body of the pancreas adjacent to the resection line noted on the CT 08/05/2013 and 10/31/2013-discussed with Dr. Barry Dienes, felt to represent  postoperative change    Disposition:  Jon Pena remains in clinical remission from pancreas cancer. We will follow-up on the CA 19-9 from today. He will return for an office visit and CA 19-9 in 6 months.  Betsy Coder, MD  07/06/2015  9:52 AM

## 2015-07-06 NOTE — Telephone Encounter (Signed)
Talked to patient here in office. Scheduled appt.       [facility]. °

## 2015-07-07 ENCOUNTER — Telehealth: Payer: Self-pay | Admitting: *Deleted

## 2015-07-07 LAB — CANCER ANTIGEN 19-9: CAN 19-9: 1 U/mL (ref 0–35)

## 2015-07-07 LAB — CANCER ANTIGEN 19-9 (PARALLEL TESTING): CA 19 9: 4.6 U/mL (ref <35.0)

## 2015-07-07 NOTE — Telephone Encounter (Signed)
-----   Message from Ladell Pier, MD sent at 07/07/2015  8:18 AM EST ----- Please call patient, ca19-9 is normal

## 2015-07-07 NOTE — Telephone Encounter (Signed)
Per Dr. Sherrill; notified pt that ca19-9 is normal.  Pt verbalized understanding and expressed appreciation for call. 

## 2015-11-05 ENCOUNTER — Encounter: Payer: Self-pay | Admitting: Gastroenterology

## 2016-01-03 ENCOUNTER — Ambulatory Visit (HOSPITAL_BASED_OUTPATIENT_CLINIC_OR_DEPARTMENT_OTHER): Payer: PPO | Admitting: Oncology

## 2016-01-03 ENCOUNTER — Other Ambulatory Visit (HOSPITAL_BASED_OUTPATIENT_CLINIC_OR_DEPARTMENT_OTHER): Payer: PPO

## 2016-01-03 VITALS — BP 123/78 | HR 53 | Temp 98.1°F | Resp 18 | Ht 71.0 in | Wt 196.8 lb

## 2016-01-03 DIAGNOSIS — R634 Abnormal weight loss: Secondary | ICD-10-CM | POA: Diagnosis not present

## 2016-01-03 DIAGNOSIS — C252 Malignant neoplasm of tail of pancreas: Secondary | ICD-10-CM

## 2016-01-03 NOTE — Progress Notes (Signed)
  [facility] P[facility]   Diagnosis: Pancreas cancer  INTERVAL HISTORY:   Jon Pena returns as scheduled. He feels well. He is exercising and playing golf. He works around his home. He has a good appetite. He notes GI upset after eating certain foods. No diarrhea. No nausea or pain.  Objective:  Vital signs in last 24 hours:  Blood pressure 123/78, pulse 53, temperature 98.1 F (36.7 C), temperature source Oral, resp. rate 18, height 5\' 11"  (1.803 m), weight 196 lb 12.8 oz (89.268 kg), SpO2 97 %.    HEENT: Neck without mass Lymphatics: No cervical, supraclavicular, axillary, or inguinal nodes Resp: Lungs clear bilaterally Cardio: Regular rate and rhythm GI: No hepatosplenomegaly, no mass, nontender, no apparent ascites Vascular: No leg edema  Medications: I have reviewed the patient's current medications.  Assessment/Plan: 1. 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  Adjuvant gemcitabine 03/27/2013, 04/03/2013 and 04/10/2013.   Initiation of radiation and capecitabine 04/28/2013. Discontinued 05/22/2013 secondary to nausea.   Adjuvant gemcitabine resumed 06/17/2013. Last gemcitabine given 07/22/2013. 2. Left renal cyst with a mural nodular component on MRI 02/20/2013. Indeterminate. 3. Acute appendicitis status post laparoscopic appendectomy 01/26/2013. 4. Port-A-Cath placement 03/21/2013, removed 08/26/2013. 5. Right mid to low back sebaceous cyst.  6. History of Rosacea rash over the face. 7. History of tobacco use  8. Screening chest CT 08/05/2013 revealed masslike and nodular opacities in the medial segment of the right middle lobe and in the inferior aspect of the lingula, followup chest CT 10/31/2013 revealed improvement 9. Cystic-appearing lesion in the body of the pancreas adjacent to the resection line noted on the CT 08/05/2013 and 10/31/2013-discussed  with Dr. Barry Dienes, felt to represent postoperative change   Disposition:  Jon Pena remains in clinical remission from pancreas cancer. He has lost weight over the past 6 months, but reports a good appetite. I suspect the weight loss is related to his exercise regimen. He will contact us for consistent abdominal pain or nausea.  We will follow-up on the CA 19-9 from today. Jon Pena will return for an office visit in 6 months.  Betsy Coder, MD  01/03/2016  9:02 AM

## 2016-01-04 ENCOUNTER — Telehealth: Payer: Self-pay | Admitting: Oncology

## 2016-01-04 ENCOUNTER — Telehealth: Payer: Self-pay | Admitting: *Deleted

## 2016-01-04 LAB — CANCER ANTIGEN 19-9: CA 19-9: 1 U/mL (ref 0–35)

## 2016-01-04 LAB — CANCER ANTIGEN 19-9 (PARALLEL TESTING): CA 19-9: 3 U/mL (ref <34)

## 2016-01-04 NOTE — Telephone Encounter (Signed)
lvm to inform pt of 6 month follow up appt 1/18 at 1130 am

## 2016-01-04 NOTE — Telephone Encounter (Signed)
-----   Message from Brien Few, RN sent at 01/04/2016 10:50 AM EDT -----   ----- Message -----    From: Ladell Pier, MD    Sent: 01/04/2016   7:36 AM      To: Arlice Colt [facility]  Please call patient, tumor marker is normal

## 2016-01-04 NOTE — Telephone Encounter (Signed)
Spoke to pt advised normal results. Pt denies any question or concerns at this time.

## 2016-01-18 DIAGNOSIS — H61009 Unspecified perichondritis of external ear, unspecified ear: Secondary | ICD-10-CM | POA: Diagnosis not present

## 2016-01-18 DIAGNOSIS — L814 Other melanin hyperpigmentation: Secondary | ICD-10-CM | POA: Diagnosis not present

## 2016-01-18 DIAGNOSIS — L820 Inflamed seborrheic keratosis: Secondary | ICD-10-CM | POA: Diagnosis not present

## 2016-01-18 DIAGNOSIS — L821 Other seborrheic keratosis: Secondary | ICD-10-CM | POA: Diagnosis not present

## 2016-01-18 DIAGNOSIS — D225 Melanocytic nevi of trunk: Secondary | ICD-10-CM | POA: Diagnosis not present

## 2016-02-18 DIAGNOSIS — Z6825 Body mass index (BMI) 25.0-25.9, adult: Secondary | ICD-10-CM | POA: Diagnosis not present

## 2016-02-18 DIAGNOSIS — E782 Mixed hyperlipidemia: Secondary | ICD-10-CM | POA: Diagnosis not present

## 2016-02-18 DIAGNOSIS — K859 Acute pancreatitis without necrosis or infection, unspecified: Secondary | ICD-10-CM | POA: Diagnosis not present

## 2016-02-18 DIAGNOSIS — D017 Carcinoma in situ of other specified digestive organs: Secondary | ICD-10-CM | POA: Diagnosis not present

## 2016-02-18 DIAGNOSIS — Z1389 Encounter for screening for other disorder: Secondary | ICD-10-CM | POA: Diagnosis not present

## 2016-02-18 DIAGNOSIS — R7309 Other abnormal glucose: Secondary | ICD-10-CM | POA: Diagnosis not present

## 2016-03-15 DIAGNOSIS — Z6825 Body mass index (BMI) 25.0-25.9, adult: Secondary | ICD-10-CM | POA: Diagnosis not present

## 2016-03-15 DIAGNOSIS — S83411A Sprain of medial collateral ligament of right knee, initial encounter: Secondary | ICD-10-CM | POA: Diagnosis not present

## 2016-03-15 DIAGNOSIS — Z1389 Encounter for screening for other disorder: Secondary | ICD-10-CM | POA: Diagnosis not present

## 2016-06-26 ENCOUNTER — Other Ambulatory Visit: Payer: Self-pay | Admitting: Nurse Practitioner

## 2016-06-30 DIAGNOSIS — C257 Malignant neoplasm of other parts of pancreas: Secondary | ICD-10-CM | POA: Diagnosis not present

## 2016-06-30 DIAGNOSIS — Z1389 Encounter for screening for other disorder: Secondary | ICD-10-CM | POA: Diagnosis not present

## 2016-06-30 DIAGNOSIS — R1013 Epigastric pain: Secondary | ICD-10-CM | POA: Diagnosis not present

## 2016-06-30 DIAGNOSIS — E663 Overweight: Secondary | ICD-10-CM | POA: Diagnosis not present

## 2016-06-30 DIAGNOSIS — E538 Deficiency of other specified B group vitamins: Secondary | ICD-10-CM | POA: Diagnosis not present

## 2016-06-30 DIAGNOSIS — J329 Chronic sinusitis, unspecified: Secondary | ICD-10-CM | POA: Diagnosis not present

## 2016-06-30 DIAGNOSIS — Z6825 Body mass index (BMI) 25.0-25.9, adult: Secondary | ICD-10-CM | POA: Diagnosis not present

## 2016-06-30 DIAGNOSIS — K120 Recurrent oral aphthae: Secondary | ICD-10-CM | POA: Diagnosis not present

## 2016-07-06 ENCOUNTER — Other Ambulatory Visit (HOSPITAL_BASED_OUTPATIENT_CLINIC_OR_DEPARTMENT_OTHER): Payer: PPO

## 2016-07-06 ENCOUNTER — Telehealth: Payer: Self-pay | Admitting: Oncology

## 2016-07-06 ENCOUNTER — Ambulatory Visit (HOSPITAL_BASED_OUTPATIENT_CLINIC_OR_DEPARTMENT_OTHER): Payer: PPO | Admitting: Oncology

## 2016-07-06 VITALS — BP 122/62 | HR 57 | Temp 98.1°F | Resp 17 | Ht 71.0 in | Wt 185.8 lb

## 2016-07-06 DIAGNOSIS — C252 Malignant neoplasm of tail of pancreas: Secondary | ICD-10-CM | POA: Diagnosis not present

## 2016-07-06 DIAGNOSIS — R52 Pain, unspecified: Secondary | ICD-10-CM

## 2016-07-06 NOTE — Progress Notes (Signed)
  [place] OFFICE PROGRESS NOTE   Diagnosis: Pancreas cancer  INTERVAL HISTORY:   Mr. Beckius returns as scheduled. He has a good appetite. He has altered his diet since being diagnosed with pancreatitis last fall. He reports several episodes of "pancreatitis "managed by his primary physician. He had another episode beginning approximately 2 weeks ago. He develops mid abdominal pain that improves when he changes to a liquid diet. He has decreased sugar intake. The pain is improving.  Objective:  Vital signs in last 24 hours:  Blood pressure 122/62, pulse (!) 57, temperature 98.1 F (36.7 C), temperature source Oral, resp. rate 17, height 5\' 11"  (1.803 m), weight 185 lb 12.8 oz (84.3 kg), SpO2 97 %.    HEENT: Neck without mass Lymphatics: No cervical, supraclavicular, axillary, or inguinal nodes Resp: Lungs clear bilaterally Cardio: Regular rate and rhythm GI: No hepatosplenomegaly, nontender, no mass Vascular: No leg edema   Lab Results:  CA 19-9 pending   Medications: I have reviewed the patient's current medications.  Assessment/Plan: 1. 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  Adjuvant gemcitabine 03/27/2013, 04/03/2013 and 04/10/2013.   Initiation of radiation and capecitabine 04/28/2013. Discontinued 05/22/2013 secondary to nausea.   Adjuvant gemcitabine resumed 06/17/2013. Last gemcitabine given 07/22/2013. 2. Left renal cyst with a mural nodular component on MRI 02/20/2013. Indeterminate. 3. Acute appendicitis status post laparoscopic appendectomy 01/26/2013. 4. Port-A-Cath placement 03/21/2013, removed 08/26/2013. 5. Right mid to low back sebaceous cyst.  6. History of Rosacea rash over the face. 7. History of tobacco use  8. Screening chest CT 08/05/2013 revealed masslike and nodular opacities in the medial segment of the right middle lobe and in the inferior  aspect of the lingula, followup chest CT 10/31/2013 revealed improvement 9. Cystic-appearing lesion in the body of the pancreas adjacent to the resection line noted on the CT 08/05/2013 and 10/31/2013-discussed with Dr. Barry Dienes, felt to represent postoperative change 10. "Pancreatitis "-managed by primary M.D.    Disposition:  Mr. Rosezella Florida remains in clinical remission from pancreas cancer. He relates weight loss to a change in his diet and several episodes of pancreatitis. He has a good appetite and energy level. We will follow-up on the CA 19-9 from today, understanding the CA 19-9 could be elevated in the setting of pancreatitis. He will contact us if the pain does not resolve.  Mr. Rosezella Florida will return for an office and lab visit in 6 months. We are available to see him sooner as needed.  Betsy Coder, MD  07/06/2016  12:07 PM

## 2016-07-06 NOTE — Telephone Encounter (Signed)
Appointments scheduled per 1/18 LOS. Patient given AVS report and calendars with future scheduled appointments. °

## 2016-07-07 ENCOUNTER — Telehealth: Payer: Self-pay | Admitting: *Deleted

## 2016-07-07 LAB — CANCER ANTIGEN 19-9: CA 19-9: 2 U/mL (ref 0–35)

## 2016-07-07 NOTE — Telephone Encounter (Signed)
-----   Message from Ladell Pier, MD sent at 07/07/2016  4:48 PM EST ----- Please call patient, ca19-9 is normal, call if abdomen pain does not improve

## 2016-07-07 NOTE — Telephone Encounter (Signed)
Called pt with CA19-9 result. He understands to call office if abdominal pain does not improve.

## 2016-11-28 DIAGNOSIS — R7309 Other abnormal glucose: Secondary | ICD-10-CM | POA: Diagnosis not present

## 2016-11-28 DIAGNOSIS — Z1389 Encounter for screening for other disorder: Secondary | ICD-10-CM | POA: Diagnosis not present

## 2016-11-28 DIAGNOSIS — Z23 Encounter for immunization: Secondary | ICD-10-CM | POA: Diagnosis not present

## 2016-11-28 DIAGNOSIS — Z0000 Encounter for general adult medical examination without abnormal findings: Secondary | ICD-10-CM | POA: Diagnosis not present

## 2016-11-28 DIAGNOSIS — M6281 Muscle weakness (generalized): Secondary | ICD-10-CM | POA: Diagnosis not present

## 2016-11-28 DIAGNOSIS — R200 Anesthesia of skin: Secondary | ICD-10-CM | POA: Diagnosis not present

## 2016-11-28 DIAGNOSIS — C259 Malignant neoplasm of pancreas, unspecified: Secondary | ICD-10-CM | POA: Diagnosis not present

## 2016-11-28 DIAGNOSIS — Z6824 Body mass index (BMI) 24.0-24.9, adult: Secondary | ICD-10-CM | POA: Diagnosis not present

## 2016-11-28 DIAGNOSIS — R1032 Left lower quadrant pain: Secondary | ICD-10-CM | POA: Diagnosis not present

## 2016-11-29 ENCOUNTER — Other Ambulatory Visit (HOSPITAL_COMMUNITY): Payer: Self-pay | Admitting: Family Medicine

## 2016-11-29 DIAGNOSIS — R1032 Left lower quadrant pain: Secondary | ICD-10-CM

## 2016-12-05 DIAGNOSIS — E782 Mixed hyperlipidemia: Secondary | ICD-10-CM | POA: Diagnosis not present

## 2016-12-06 ENCOUNTER — Encounter: Payer: Self-pay | Admitting: Cardiology

## 2016-12-08 ENCOUNTER — Ambulatory Visit (HOSPITAL_COMMUNITY)
Admission: RE | Admit: 2016-12-08 | Discharge: 2016-12-08 | Disposition: A | Discharge status: Home / Self Care | Payer: PPO | Source: Ambulatory Visit | Attending: Family Medicine | Admitting: Family Medicine

## 2016-12-08 DIAGNOSIS — N281 Cyst of kidney, acquired: Secondary | ICD-10-CM | POA: Diagnosis not present

## 2016-12-08 DIAGNOSIS — I700 Atherosclerosis of aorta: Secondary | ICD-10-CM | POA: Diagnosis not present

## 2016-12-08 DIAGNOSIS — Z8507 Personal history of malignant neoplasm of pancreas: Secondary | ICD-10-CM | POA: Diagnosis not present

## 2016-12-08 DIAGNOSIS — N400 Enlarged prostate without lower urinary tract symptoms: Secondary | ICD-10-CM | POA: Diagnosis not present

## 2016-12-08 DIAGNOSIS — I2510 Atherosclerotic heart disease of native coronary artery without angina pectoris: Secondary | ICD-10-CM | POA: Diagnosis not present

## 2016-12-08 DIAGNOSIS — M4316 Spondylolisthesis, lumbar region: Secondary | ICD-10-CM | POA: Diagnosis not present

## 2016-12-08 DIAGNOSIS — R1032 Left lower quadrant pain: Secondary | ICD-10-CM | POA: Diagnosis not present

## 2016-12-08 DIAGNOSIS — Z90411 Acquired partial absence of pancreas: Secondary | ICD-10-CM | POA: Insufficient documentation

## 2016-12-08 DIAGNOSIS — K5730 Diverticulosis of large intestine without perforation or abscess without bleeding: Secondary | ICD-10-CM | POA: Diagnosis not present

## 2016-12-08 DIAGNOSIS — Z9889 Other specified postprocedural states: Secondary | ICD-10-CM | POA: Insufficient documentation

## 2016-12-08 MED ORDER — IOPAMIDOL (ISOVUE-M 300) INJECTION 61%: 15.0000 mL | Freq: Once | INTRAMUSCULAR | Status: DC | PRN

## 2016-12-08 MED ORDER — IOPAMIDOL (ISOVUE-300) INJECTION 61%
INTRAVENOUS | Status: AC
  Administered 2016-12-08: 100 mL
  Filled 2016-12-08: qty 100

## 2016-12-10 ENCOUNTER — Telehealth: Payer: Self-pay | Admitting: Oncology

## 2016-12-10 NOTE — Telephone Encounter (Signed)
Lvm advising appt chg from 7/17 md pal to 8/7 @ 7.45am.

## 2016-12-12 ENCOUNTER — Telehealth: Payer: Self-pay | Admitting: *Deleted

## 2016-12-12 NOTE — Telephone Encounter (Signed)
Patient called to notify appointment with Dr. Benay Spice at [facility] on 7/6. Patient confirms appointment time. Message sent to scheduling to have this added. Patient verbalized an understanding to call this office if symptoms get worse.

## 2016-12-12 NOTE — Telephone Encounter (Signed)
Message from [facility] call service: pt called to report he had CT done and they recommend MRI. PCP recommended he follow up with Dr. Benay Spice re: need for MRI. Returned call, pt reports he had abdominal pain for several weeks prompting CT. Pt thinks it may have been pancreatitis. LUQ pain is now intermittent but always in the same place.  Pt reports 13-15 lb weight loss after cutting sugar and white bread/ potatoes. Marland Kitchen  MRI has not been ordered.

## 2016-12-22 ENCOUNTER — Ambulatory Visit (HOSPITAL_BASED_OUTPATIENT_CLINIC_OR_DEPARTMENT_OTHER): Payer: PPO | Admitting: Oncology

## 2016-12-22 VITALS — BP 118/64 | HR 51 | Temp 97.8°F | Resp 18 | Ht 71.0 in | Wt 176.9 lb

## 2016-12-22 DIAGNOSIS — R109 Unspecified abdominal pain: Secondary | ICD-10-CM

## 2016-12-22 DIAGNOSIS — C252 Malignant neoplasm of tail of pancreas: Secondary | ICD-10-CM

## 2016-12-22 NOTE — Progress Notes (Signed)
  [place] OFFICE PROGRESS NOTE   Diagnosis: Pancreas cancer  INTERVAL HISTORY:   Jon Pena returns prior to scheduled visit. He reports intermittent left abdominal pain for the past several months. The pain is relieved with meloxicam. He relates the pain to hot pepper added to his food. Good appetite and energy level. He golfs frequently. He is scheduled to see neurology for evaluation of discomfort in his legs.  He relates weight loss to a low carbohydrate diet.  An abdominal CT obtained by Dr. Hilma Favors on 12/08/2016 revealed increased irregular soft tissue encasing the superior mesenteric artery compared to a chest CT from May 2015. There is also new prominence of the left adrenal gland.  Objective:  Vital signs in last 24 hours:  Blood pressure 118/64, pulse (!) 51, temperature 97.8 F (36.6 C), temperature source Oral, resp. rate 18, height 5\' 11"  (1.803 m), weight 176 lb 14.4 oz (80.2 kg), SpO2 98 %.    HEENT: Neck without mass Lymphatics: No cervical, supraclavicular, axillary, or inguinal nodes Resp: Lungs clear bilaterally Cardio: Regular rate and rhythm GI: No hepatosplenomegaly, no mass, no apparent ascites, nontender Vascular: No leg edema     Imaging:  CT images from 12/08/2016 reviewed with Jon Pena  Medications: I have reviewed the patient's current medications.  Assessment/Plan: 1. 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  Adjuvant gemcitabine 03/27/2013, 04/03/2013 and 04/10/2013.   Initiation of radiation and capecitabine 04/28/2013. Discontinued 05/22/2013 secondary to nausea.   Adjuvant gemcitabine resumed 06/17/2013. Last gemcitabine given 07/22/2013. 2. Left renal cyst with a mural nodular component on MRI 02/20/2013. Indeterminate. 3. Acute appendicitis status post laparoscopic appendectomy 01/26/2013. 4. Port-A-Cath placement 03/21/2013, removed  08/26/2013. 5. Right mid to low back sebaceous cyst.  6. History of Rosacea rash over the face. 7. History of tobacco use  8. Screening chest CT 08/05/2013 revealed masslike and nodular opacities in the medial segment of the right middle lobe and in the inferior aspect of the lingula, followup chest CT 10/31/2013 revealed improvement 9. Cystic-appearing lesion in the body of the pancreas adjacent to the resection line noted on the CT 08/05/2013 and 10/31/2013-discussed with Dr. Barry Dienes, felt to represent postoperative change 10. "Pancreatitis "-managed by primary M.D.   Disposition:  He remains in clinical remission from pancreas cancer. There is increased soft tissue density at the resection bed compared to a chest CT from 2015. I reviewed the CT images with Jon Pena. The pain is intermittent and relieved with meloxicam. We discussed a PET scan and biopsy of the soft tissue density. He does not wish to proceed with additional evaluation at present. He will return for an office visit as scheduled next month.  I will present his case at the GI tumor conference within the next few weeks. I will let Dr. Barry Dienes know about the CT finding.  25 minutes were spent with the patient today. The majority of the time was used for counseling and coordination of care.  Donneta Romberg, MD  12/22/2016  8:36 AM

## 2016-12-26 ENCOUNTER — Encounter: Payer: Self-pay | Admitting: Genetics

## 2016-12-28 ENCOUNTER — Ambulatory Visit: Payer: PPO | Admitting: Cardiology

## 2016-12-28 ENCOUNTER — Telehealth: Payer: Self-pay | Admitting: *Deleted

## 2016-12-28 DIAGNOSIS — C252 Malignant neoplasm of tail of pancreas: Secondary | ICD-10-CM

## 2016-12-28 NOTE — Telephone Encounter (Signed)
-----   Message from Ladell Pier, MD sent at 12/27/2016  9:21 PM EDT ----- Please call him, case was presented at GI conference 7/11 Recommendation is to proceed with PET to evaluate soft tissue mass at pancreas resection bed  Please scheduled PET, initial skull base to thigh, for next few weeks, he can f/u 8/7 as scheduled or sooner if he wants

## 2016-12-28 NOTE — Telephone Encounter (Signed)
Left message for pt to call office. Orders entered for PET.

## 2017-01-02 ENCOUNTER — Ambulatory Visit: Payer: PPO | Admitting: Oncology

## 2017-01-02 ENCOUNTER — Other Ambulatory Visit: Payer: PPO

## 2017-01-11 ENCOUNTER — Ambulatory Visit (INDEPENDENT_AMBULATORY_CARE_PROVIDER_SITE_OTHER): Payer: PPO | Admitting: Neurology

## 2017-01-11 ENCOUNTER — Encounter: Payer: Self-pay | Admitting: Neurology

## 2017-01-11 VITALS — BP 133/81 | HR 49 | Ht 71.0 in | Wt 178.0 lb

## 2017-01-11 DIAGNOSIS — M6281 Muscle weakness (generalized): Secondary | ICD-10-CM | POA: Insufficient documentation

## 2017-01-11 NOTE — Patient Instructions (Signed)
We will get blood work today and get EMG and NCV evaluation to look at muscle and nerve function of the legs.

## 2017-01-11 NOTE — Progress Notes (Signed)
Reason for visit: Muscle weakness  Referring physician: Dr. Carrington Clamp Jon Pena is a 66 y.o. male  History of present illness:  Jon Pena is a 67 year old right-handed white male with a history of pancreatic cancer that was diagnosed 4 years ago. The patient underwent chemotherapy with Gemcitabine, and he has done quite well over the last several years. The patient indicates that he initially lost about 50 pounds, but over the last year he has been stable with his weight. He has had no definite recurrence of the pancreatic cancer. The patient indicates that over the last 2 years she has had an indolent change in his muscle strength involving the legs. He claims that he remains quite active physically, he plays golf several times a week and he does well with this. The patient indicates that he has intermittent symptoms where he may feel weak in the legs. If he stands in one place too long, he may develop a tingling sensation in the feet, and his legs feel weak as if they may collapse. The patient has had arterial Doppler studies of the legs he claims that did not show critical peripheral vascular disease. The patient has recently undergone blood work through his primary care doctor that includes a thyroid profile and a B12 level, the results are not available to me, but the patient believes that these were unremarkable. The patient denies any significant neck pain or back pain or pain down the legs. He has noted some atrophy of the muscles below the knees. The patient usually does not note any problems with balance, he denies problems climbing stairs or getting up out of a chair. He denies any significant discomfort in his feet at nighttime when he tries to sleep. He denies issues controlling the bowels or the bladder. He is sent to this office for further evaluation.  Past Medical History:  Diagnosis Date  . GERD (gastroesophageal reflux disease)   . HOH (hard of hearing)   . Pancreatic  adenocarcinoma ([place]) 02/27/13   Stage IIB (T3 N1) status post distal pancreatectomy/splenectomy, status post chemotherapy and XRT  . Pancreatitis     Past Surgical History:  Procedure Laterality Date  . APPENDECTOMY    . COLONOSCOPY    . EUS N/A 02/20/2013   Procedure: UPPER ENDOSCOPIC ULTRASOUND (EUS) LINEAR;  Surgeon: Milus Banister, MD;  Location: [facility];  Service: Endoscopy;  Laterality: N/A;  . LAPAROSCOPIC APPENDECTOMY N/A 01/26/2013   Procedure: APPENDECTOMY LAPAROSCOPIC;  Surgeon: Zenovia Jarred, MD;  Location: [facility];  Service: General;  Laterality: N/A;  . LAPAROSCOPIC SPLENECTOMY N/A 02/27/2013   Procedure: LAPAROSCOPIC SPLENECTOMY;  Surgeon: Stark Klein, MD;  Location: [facility];  Service: General;  Laterality: N/A;  . PANCREATECTOMY N/A 02/27/2013   Procedure: LAPAROSCOPIC PANCREATECTOMY;  Surgeon: Stark Klein, MD;  Location: [facility];  Service: General;  Laterality: N/A;  . POLYPECTOMY    . PORT-A-CATH REMOVAL Left 08/26/2013   Procedure: REMOVAL PORT-A-CATH;  Surgeon: Stark Klein, MD;  Location: [facility];  Service: General;  Laterality: Left;  . PORTACATH PLACEMENT N/A 03/21/2013   Procedure: INSERTION PORT-A-CATH;  Surgeon: Stark Klein, MD;  Location: [facility];  Service: General;  Laterality: N/A;  . TONSILLECTOMY      Family History  Problem Relation Age of Onset  . Breast cancer Paternal Aunt   . Colon cancer Neg Hx   . Rectal cancer Neg Hx   . Stomach cancer Neg Hx  Social history:  reports that he quit smoking about 3 years ago. His smoking use included Cigarettes. He has a 20.00 pack-year smoking history. He has never used smokeless tobacco. He reports that he does not drink alcohol or use drugs.  Medications:  Prior to Admission medications   Medication Sig Start Date End Date Taking? Authorizing Provider  aspirin 81 MG tablet Take 81 mg by mouth at bedtime.    [provider]  Blood Glucose Monitoring Suppl  (BLOOD GLUCOSE METER) kit Use as directed 03/05/13   Stark Klein, MD  famotidine (PEPCID) 10 MG tablet Take 10 mg by mouth 2 (two) times daily as needed for heartburn.     [provider]  Glucose Blood (BLOOD GLUCOSE TEST STRIPS) STRP 1 strip by Subdermal route 2 (two) times daily. Patient not taking: Reported on 07/06/2015 03/05/13   Stark Klein, MD  meloxicam (MOBIC) 15 MG tablet Take 15 mg by mouth daily. 05/31/16   [provider]  naproxen sodium (ANAPROX) 220 MG tablet Take 220 mg by mouth as needed. Takes 2 prn when he plays golf    [provider]  tobramycin-dexamethasone (TOBRADEX) ophthalmic solution USE 1 DROP IN RIGHT EYE 4 TIMES A DAY 12/03/14   [provider]     No Known Allergies  ROS:  Out of a complete 14 system review of symptoms, the patient complains only of the following symptoms, and all other reviewed systems are negative.  Fatigue Chest pain, palpitations of the heart Hearing loss, ringing in the ears Itching, moles Blurred vision, loss of vision, flashing lights in the right eye Joint swelling Numbness, weakness Not enough sleep  Blood pressure 133/81, pulse (!) 49, height 5' 11"  (1.803 m), weight 178 lb (80.7 kg).  Physical Exam  General: The patient is alert and cooperative at the time of the examination.  Eyes: Pupils are equal, round, and reactive to light. Discs are flat bilaterally.  Neck: The neck is supple, no carotid bruits are noted.  Respiratory: The respiratory examination is clear.  Cardiovascular: The cardiovascular examination reveals a regular rate and rhythm, no obvious murmurs or rubs are noted.  Neuromuscular: Range of movement the low back was full.  Skin: Extremities are without significant edema.  Neurologic Exam  Mental status: The patient is alert and oriented x 3 at the time of the examination. The patient has apparent normal recent and remote memory, with an apparently normal  attention span and concentration ability.  Cranial nerves: Facial symmetry is present. There is good sensation of the face to pinprick and soft touch bilaterally. The strength of the facial muscles and the muscles to head turning and shoulder shrug are normal bilaterally. Speech is well enunciated, no aphasia or dysarthria is noted. Extraocular movements are full. Visual fields are full. The tongue is midline, and the patient has symmetric elevation of the soft palate. No obvious hearing deficits are noted.  Motor: The motor testing reveals 5 over 5 strength of all 4 extremities, with exception that there is a mild right foot drop. Good symmetric motor tone is noted throughout.  Sensory: Sensory testing is intact to pinprick, soft touch, vibration sensation, and position sense on all 4 extremities, with exception that there is some decrease in vibration sensation on the right foot as compared to left. No definite stocking pattern pinprick sensory deficit is noted. No evidence of extinction is noted.  Coordination: Cerebellar testing reveals good finger-nose-finger and heel-to-shin bilaterally.  Gait and station: Gait is  normal. Tandem gait is normal. Romberg is negative. No drift is seen. The patient is able to walk on the toes bilaterally, he has difficulty walking on the heels on the right, somewhat better on the left  Reflexes: Deep tendon reflexes are symmetric and normal bilaterally. Toes are downgoing bilaterally.   Assessment/Plan:  1. Reported leg weakness  2. History of pancreatic cancer  The patient does have a mild foot drop on the right, otherwise he has no definite weakness of the extremities. The patient reports intermittent symptoms, he is worse when he is trying to stand for long period of time. He does not report dizziness or fainting sensations. His heart rate today in the office was 49. The patient will be set up for blood work today, he will have EMG evaluation on the right  leg, nerve conduction studies on both legs. We may consider MRI of the low back to exclude lumbosacral spinal stenosis in the future. He will follow-up with above study.  Jill Alexanders MD 01/11/2017 8:35 AM  [facility] [address] [place], [place] [address]  Phone [phone] Fax [phone]

## 2017-01-17 ENCOUNTER — Encounter: Payer: Self-pay | Admitting: Cardiology

## 2017-01-17 NOTE — Progress Notes (Signed)
Cardiology Office Note  Date: 01/18/2017   ID: Jon Pena, DOB July 04, 1950, MRN [id number]  PCP: Sharilyn Sites, MD  Consulting Cardiologist: Rozann Lesches, MD   Chief Complaint  Patient presents with  . Leg Pain    History of Present Illness: Jon Pena is a 66 y.o. male referred for cardiology consultation by Dr. Hilma Favors for the evaluation of leg pain and weakness. He states that he has been having pain in both legs, also tingling in his feet, particularly when he stands for prolonged periods of time, not necessarily when he is walking. He has had pain in his lower legs, mainly right sided.  He was seen recently for neurological evaluation by Dr. Jannifer Franklin for further evaluation of leg weakness. He was found to have mild foot drop on the right. Further workup is pending including MRI and EMG studies.  He did undergo a health screening which indicated abnormal ABIs on the right side, only mildly reduced however.  He was seen by our practice back in 2015 at which point he underwent an exercise echocardiogram that was negative for ischemia. He does not report any angina symptoms.  Past Medical History:  Diagnosis Date  . GERD (gastroesophageal reflux disease)   . HOH (hard of hearing)   . Hyperlipidemia   . Pancreatic adenocarcinoma ([place]) 02/27/13   Stage IIB (T3 N1) status post distal pancreatectomy/splenectomy, status post chemotherapy and XRT  . Pancreatitis     Past Surgical History:  Procedure Laterality Date  . APPENDECTOMY    . COLONOSCOPY    . EUS N/A 02/20/2013   Procedure: UPPER ENDOSCOPIC ULTRASOUND (EUS) LINEAR;  Surgeon: Milus Banister, MD;  Location: [facility];  Service: Endoscopy;  Laterality: N/A;  . LAPAROSCOPIC APPENDECTOMY N/A 01/26/2013   Procedure: APPENDECTOMY LAPAROSCOPIC;  Surgeon: Zenovia Jarred, MD;  Location: [facility];  Service: General;  Laterality: N/A;  . LAPAROSCOPIC SPLENECTOMY N/A 02/27/2013   Procedure: LAPAROSCOPIC SPLENECTOMY;   Surgeon: Stark Klein, MD;  Location: [facility];  Service: General;  Laterality: N/A;  . PANCREATECTOMY N/A 02/27/2013   Procedure: LAPAROSCOPIC PANCREATECTOMY;  Surgeon: Stark Klein, MD;  Location: [facility];  Service: General;  Laterality: N/A;  . POLYPECTOMY    . PORT-A-CATH REMOVAL Left 08/26/2013   Procedure: REMOVAL PORT-A-CATH;  Surgeon: Stark Klein, MD;  Location: [facility];  Service: General;  Laterality: Left;  . PORTACATH PLACEMENT N/A 03/21/2013   Procedure: INSERTION PORT-A-CATH;  Surgeon: Stark Klein, MD;  Location: [facility];  Service: General;  Laterality: N/A;  . TONSILLECTOMY      Current Outpatient Prescriptions  Medication Sig Dispense Refill  . aspirin 81 MG tablet Take 81 mg by mouth at bedtime.    . famotidine (PEPCID) 10 MG tablet Take 10 mg by mouth 2 (two) times daily as needed for heartburn.     . meloxicam (MOBIC) 15 MG tablet Take 15 mg by mouth daily.     No current facility-administered medications for this visit.    Allergies:  Patient has no known allergies.   Social History: The patient  reports that he quit smoking about 3 years ago. His smoking use included Cigarettes. He has a 20.00 pack-year smoking history. He has never used smokeless tobacco. He reports that he does not drink alcohol or use drugs.   Family History: The patient's family history includes Breast cancer in his paternal aunt; Pancreatitis in his father.   ROS:  Please see the history of present  illness. Otherwise, complete review of systems is positive for none.  All other systems are reviewed and negative.   Physical Exam: VS:  BP 122/64 (BP Location: Left Arm)   Pulse (!) 55   Ht 5\' 10"  (1.778 m)   Wt 175 lb (79.4 kg)   SpO2 97%   BMI 25.11 kg/m , BMI Body mass index is 25.11 kg/m.  Wt Readings from Last 3 Encounters:  01/18/17 175 lb (79.4 kg)  01/11/17 178 lb (80.7 kg)  12/22/16 176 lb 14.4 oz (80.2 kg)    General: Patient appears comfortable at  rest. HEENT: Conjunctiva and lids normal, oropharynx clear. Neck: Supple, no elevated JVP or carotid bruits, no thyromegaly. Lungs: Clear to auscultation, nonlabored breathing at rest. Cardiac: Regular rate and rhythm, no S3 or significant systolic murmur, no pericardial rub. Abdomen: Soft, nontender, bowel sounds present, no guarding or rebound. Extremities: No pitting edema, DPs 1+ to 2+. Skin: Warm and dry. Musculoskeletal: No kyphosis. Neuropsychiatric: Alert and oriented x3, affect grossly appropriate.  ECG: I personally reviewed the tracing from 11/11/2013 which showed sinus bradycardia.   Recent Labwork:  September 2017: Cholesterol 260, HDL 46, LDL 195, triglycerides 96 June 2018: Cholesterol 173, HDL 39, LDL 113, triglycerides 104  Other Studies Reviewed Today:  Exercise echocardiogram 11/13/2013: Study Conclusions  - Stress ECG conclusions: The stress ECG was negative for ischemia. Duke scoring: exercise time of 11.5 min; maximum ST deviation of 0.5 mm; no angina; resulting score is 9. This score predicts a low risk of cardiac events. - Staged echo: There was no echocardiographic evidence for stress-induced ischemia.  Assessment and Plan:  1. Bilateral leg pain, right worse than left, also tingling in the feet. Symptoms sound more neuropathic than due to arterial insufficiency, may even be related to spinal stenosis based on description. He did have a recent health screening that indicated some degree of mild PAD by ABIs on the right. To be complete we will proceed with lower extremity arterial Dopplers to clarify, but it looks like PAD is not a definitive source of his symptoms. He has ongoing workup by Neurology as well.  2. Hyperlipidemia, lipids looked much better based on recent screening. He has lost a significant amount of weight over time.  3. Tobacco abuse in remission.  4. History of pancreatic cancer. Still follows with oncology and states that he has  a follow-up screening PET scan pending.  Current medicines were reviewed with the patient today.   Orders Placed This Encounter  Procedures  . EKG 12-Lead    Disposition: Follow-up will lower extremity arterial Dopplers in the [facility] office for patient convenience. Call with results.  Signed, Satira Sark, MD, [facility] 01/18/2017 9:44 AM    [facility] [place]. [address], [place], [place] [address] Phone: [phone]; Fax: [phone]

## 2017-01-18 ENCOUNTER — Encounter: Payer: Self-pay | Admitting: Cardiology

## 2017-01-18 ENCOUNTER — Ambulatory Visit (INDEPENDENT_AMBULATORY_CARE_PROVIDER_SITE_OTHER): Payer: PPO | Admitting: Cardiology

## 2017-01-18 VITALS — BP 122/64 | HR 55 | Ht 70.0 in | Wt 175.0 lb

## 2017-01-18 DIAGNOSIS — E782 Mixed hyperlipidemia: Secondary | ICD-10-CM

## 2017-01-18 DIAGNOSIS — Z8507 Personal history of malignant neoplasm of pancreas: Secondary | ICD-10-CM

## 2017-01-18 DIAGNOSIS — F17201 Nicotine dependence, unspecified, in remission: Secondary | ICD-10-CM

## 2017-01-18 DIAGNOSIS — M79604 Pain in right leg: Secondary | ICD-10-CM | POA: Diagnosis not present

## 2017-01-18 DIAGNOSIS — M79605 Pain in left leg: Secondary | ICD-10-CM | POA: Diagnosis not present

## 2017-01-18 NOTE — Patient Instructions (Signed)
Your physician recommends that you schedule a follow-up appointment- we will call you with results     Your physician has requested that you have an ankle brachial index (ABI). During this test an ultrasound and blood pressure cuff are used to evaluate the arteries that supply the arms and legs with blood. Allow thirty minutes for this exam. There are no restrictions or special instructions.     Your physician recommends that you continue on your current medications as directed. Please refer to the Current Medication list given to you today.      Thank you for choosing [facility] !

## 2017-01-19 ENCOUNTER — Ambulatory Visit (HOSPITAL_COMMUNITY): Payer: PPO

## 2017-01-22 ENCOUNTER — Encounter (HOSPITAL_COMMUNITY)
Admission: RE | Admit: 2017-01-22 | Discharge: 2017-01-22 | Disposition: A | Discharge status: Home / Self Care | Payer: PPO | Source: Ambulatory Visit | Attending: Oncology | Admitting: Oncology

## 2017-01-22 DIAGNOSIS — K869 Disease of pancreas, unspecified: Secondary | ICD-10-CM | POA: Diagnosis not present

## 2017-01-22 DIAGNOSIS — C252 Malignant neoplasm of tail of pancreas: Secondary | ICD-10-CM | POA: Diagnosis not present

## 2017-01-22 LAB — GLUCOSE, CAPILLARY: GLUCOSE-CAPILLARY: 117 mg/dL — AB (ref 65–99)

## 2017-01-22 MED ORDER — FLUDEOXYGLUCOSE F - 18 (FDG) INJECTION
8.6200 | Freq: Once | INTRAVENOUS | Status: AC | PRN
  Administered 2017-01-22: 8.62 via INTRAVENOUS

## 2017-01-23 ENCOUNTER — Telehealth: Payer: Self-pay

## 2017-01-23 ENCOUNTER — Ambulatory Visit (HOSPITAL_BASED_OUTPATIENT_CLINIC_OR_DEPARTMENT_OTHER): Payer: PPO | Admitting: Oncology

## 2017-01-23 ENCOUNTER — Other Ambulatory Visit: Payer: PPO

## 2017-01-23 VITALS — BP 143/76 | HR 52 | Temp 98.0°F | Resp 18 | Ht 70.0 in | Wt 172.6 lb

## 2017-01-23 DIAGNOSIS — R109 Unspecified abdominal pain: Secondary | ICD-10-CM | POA: Diagnosis not present

## 2017-01-23 DIAGNOSIS — C252 Malignant neoplasm of tail of pancreas: Secondary | ICD-10-CM

## 2017-01-23 DIAGNOSIS — K769 Liver disease, unspecified: Secondary | ICD-10-CM

## 2017-01-23 DIAGNOSIS — M21371 Foot drop, right foot: Secondary | ICD-10-CM | POA: Diagnosis not present

## 2017-01-23 DIAGNOSIS — M799 Soft tissue disorder, unspecified: Secondary | ICD-10-CM

## 2017-01-23 NOTE — Telephone Encounter (Signed)
-----   Message from Milus Banister, MD sent at 01/23/2017  1:49 PM EDT ----- [name], I should be able to get it.  We'll contact him.  Malin Sambrano, He needs upper EUS, radial +/- linear, for pancreatic cancer, abdominal mass.  Next available EUS Thursday with MAC.  Thanks  DJ  ----- Message ----- From: Ladell Pier, MD Sent: 01/23/2017   8:48 AM To: Milus Banister, MD  Probable local recurrence of pancreas cancer, see PET 8/6 Can you biopsy the mass in surgical bed or satellite nodule  Hoping to get him on treatment soon if we can get biopsy confirmation of recurrence  Thanks,  Brad

## 2017-01-23 NOTE — Progress Notes (Signed)
[facility] OFFICE PROGRESS NOTE   Diagnosis: Pancreas cancer  INTERVAL HISTORY:   Mr. Kronick returns as scheduled. He has abdominal pain. He takes mobile daily. He has developed weakness with dorsi flexion at the right foot. He saw Dr. Jannifer Franklin and is scheduled for nerve conduction studies tomorrow. He also has numbness at the right foot and ankle. He continues to play golf. He reports a good appetite. No difficulty with bowel or bladder function.  Objective:  Vital signs in last 24 hours:  Blood pressure (!) 143/76, pulse (!) 52, temperature 98 F (36.7 C), temperature source Oral, resp. rate 18, height 5\' 10"  (1.778 m), weight 172 lb 9.6 oz (78.3 kg), SpO2 100 %.   Resp: Lungs clear bilaterally Cardio: Regular rate and rhythm GI: No hepatomegaly, no mass, mild tenderness in the left mid lateral abdomen Vascular: No leg edema Neuro: 3+/5 strength with dorsi flexion and he version at the right foot. The motor exam is otherwise intact in the lower extremities. 2+ deep tendon reflexes at the knee bilaterally     Imaging:  Nm Pet Image Initial (pi) Skull Base To Thigh  Result Date: 01/22/2017 CLINICAL DATA:  Initial treatment strategy for pancreatic mass. EXAM: NUCLEAR MEDICINE PET SKULL BASE TO THIGH TECHNIQUE: 80.62 mCi F-18 FDG was injected intravenously. Full-ring PET imaging was performed from the skull base to thigh after the radiotracer. CT data was obtained and used for attenuation correction and anatomic localization. FASTING BLOOD GLUCOSE:  Value: 117 mg/dl COMPARISON:  CT the abdomen and pelvis 12/08/2016. Chest CT 11/01/2015. FINDINGS: NECK: No hypermetabolic lymph nodes in the neck. CHEST: No hypermetabolic mediastinal or hilar nodes. 5 mm nodule in the inferior segment of the lingula (axial image 54 of series 7) demonstrates no hypermetabolism, nonspecific, but smaller than prior study 08/04/2013, likely benign. No other larger more suspicious pulmonary nodules  on the CT scan. There is aortic atherosclerosis, as well as atherosclerosis of the great vessels of the mediastinum and the coronary arteries, including calcified atherosclerotic plaque in the left main, left anterior descending, left circumflex and right coronary arteries. Calcifications of the aortic valve. ABDOMEN/PELVIS: Again noted is a partially calcified soft tissue attenuation mass associated with the body of the pancreas adjacent to the distal pancreatic resection site, which currently measures 2.7 x 3.6 cm (axial image 121 of series 4) and is hypermetabolic (SUVmax = 6.9). Adjacent to this mass intimately associated with the inferior aspect of the proximal fundus of the stomach there is a small soft tissue nodule measuring approximately 1 cm (axial image 118 of series 4) which also is in contact with the anterior aspect of the left adrenal gland, which is hypermetabolic (SUVmax = 4.3). 2 foci of mild hypermetabolism in segment 2 and segment 4B of the liver (SUVmax = 4.5 and 4.5 respectively) are above background at metabolic activity in the liver, suspicious for potential metastatic lesions. These areas demonstrate no discernible lesion on CT images, however.) No abnormal hypermetabolic activity within the adrenal glands, or spleen. No hypermetabolic lymph nodes in the abdomen or pelvis. A few scattered colonic diverticulae are noted, without surrounding inflammatory changes to suggest an acute diverticulitis at this time. Status post appendectomy. SKELETON: No focal hypermetabolic activity to suggest skeletal metastasis. IMPRESSION: 1. The retroperitoneal lesion associated with the body of the pancreas at the resection site in this patient status post distal pancreatectomy is hypermetabolic, highly concerning for local recurrence of disease. There is an adjacent satellite lesion intimately associated with  the anterior aspect of the left adrenal gland and undersurface of the proximal fundus of the stomach,  as well as 2 areas of hypermetabolism in the liver suspicious for potential metastatic lesions. 2. Aortic atherosclerosis, in addition to left main and 3 vessel coronary artery disease. Please note that although the presence of coronary artery calcium documents the presence of coronary artery disease, the severity of this disease and any potential stenosis cannot be assessed on this non-gated CT examination. Assessment for potential risk factor modification, dietary therapy or pharmacologic therapy may be warranted, if clinically indicated. 3. There are calcifications of the aortic valve. Echocardiographic correlation for evaluation of potential valvular dysfunction may be warranted if clinically indicated. 4. Additional incidental findings, as above. Aortic Atherosclerosis (ICD10-I70.0). Electronically Signed   By: Vinnie Langton M.D.   On: 01/22/2017 10:01    Medications: I have reviewed the patient's current medications.  Assessment/Plan: 1. 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  Adjuvant gemcitabine 03/27/2013, 04/03/2013 and 04/10/2013.   Initiation of radiation and capecitabine 04/28/2013. Discontinued 05/22/2013 secondary to nausea.   Adjuvant gemcitabine resumed 06/17/2013. Last gemcitabine given 07/22/2013.  PET scan 01/22/2017-hypermetabolic soft tissue adjacent to the resection site, hypermetabolic adjacent satellite lesion, 2 areas of hypermetabolism in the liver 2. Left renal cyst with a mural nodular component on MRI 02/20/2013. Indeterminate. 3. Acute appendicitis status post laparoscopic appendectomy 01/26/2013. 4. Port-A-Cath placement 03/21/2013, removed 08/26/2013. 5. Right mid to low back sebaceous cyst.  6. History of Rosacea rash over the face. 7. History of tobacco use  8. Screening chest CT 08/05/2013 revealed masslike and nodular opacities in the medial segment of the right middle  lobe and in the inferior aspect of the lingula, followup chest CT 10/31/2013 revealed improvement 9. Cystic-appearing lesion in the body of the pancreas adjacent to the resection line noted on the CT 08/05/2013 and 10/31/2013-discussed with Dr. Barry Dienes, felt to represent postoperative change 10. "Pancreatitis "-managed by primary M.D.   Disposition:  Jon Pena has persistent abdominal pain. The restaging PET scan is consistent with recurrent pancreas cancer involving a mass at the resection site, a satellite nodule, and potentially liver metastases. I discussed the probable diagnosis and reviewed the PET images with Mr. Barrie. I will refer him to Dr. Ardis Hughs to consider an EUS biopsy.  He has developed a right foot drop. This is most likely related to peripheral nerve compression in the setting of weight loss. He is scheduled for nerve conduction studies with Dr. Jannifer Franklin tomorrow.  We discussed the likely recommendation for systemic chemotherapy if he is proven to have recurrent pancreas cancer.  Mr. Kraai will return for an office visit and further discussion after the biopsy procedure.  He declined prescription pain medication.  25 minutes were spent with the patient today. The majority of the time was used for counseling and coordination of care.  Donneta Romberg, MD  01/23/2017  8:22 AM

## 2017-01-24 ENCOUNTER — Encounter: Payer: Self-pay | Admitting: Neurology

## 2017-01-24 ENCOUNTER — Ambulatory Visit (INDEPENDENT_AMBULATORY_CARE_PROVIDER_SITE_OTHER): Payer: PPO | Admitting: Neurology

## 2017-01-24 ENCOUNTER — Ambulatory Visit (INDEPENDENT_AMBULATORY_CARE_PROVIDER_SITE_OTHER): Payer: Self-pay | Admitting: Neurology

## 2017-01-24 DIAGNOSIS — M6281 Muscle weakness (generalized): Secondary | ICD-10-CM

## 2017-01-24 LAB — CANCER ANTIGEN 19-9: CAN 19-9: 1 U/mL (ref 0–35)

## 2017-01-24 NOTE — Progress Notes (Addendum)
   The patient comes in today for EMG and nerve conduction study evaluation. 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 The possibility of an overlying lumbosacral radiculopathy is suggested, spinal stenosis is a possibility.  The patient has been recent diagnosed with recurrent pancreatic cancer, if he decides to pursue further workup for the leg weakness, we will do MRI evaluation on the low back.  The patient will contact me concerning this.       [place]    Nerve / Sites Muscle Latency Ref. Amplitude Ref. Rel Amp Segments Distance Velocity Ref. Area    ms ms mV mV %  cm m/s m/s mVms  L Peroneal - EDB     Ankle EDB 5.8 ?6.5 3.0 ?2.0 100 Ankle - EDB 9   6.6     Fib head EDB 15.3  2.4  80.4 Fib head - Ankle 42 44 ?44 6.9     Pop fossa EDB 17.3  2.1  88.8 Pop fossa - Fib head 10 51 ?44 6.9         Pop fossa - Ankle      R Peroneal - EDB     Ankle EDB 5.9 ?6.5 2.0 ?2.0 100 Ankle - EDB 9   5.8     Fib head EDB 14.7  1.1  55.3 Fib head - Ankle 42 48 ?44 3.1     Pop fossa EDB 16.6  1.2  107 Pop fossa - Fib head 10 52 ?44 3.1         Pop fossa - Ankle      L Tibial - AH     Ankle AH 5.0 ?5.8 0.9 ?4.0 100 Ankle - AH 9   2.1     Pop fossa AH 16.4  0.9  98.7 Pop fossa - Ankle 45 40 ?41 2.8  R Tibial - AH     Ankle AH 5.7 ?5.8 2.4 ?4.0 100 Ankle - AH 9   6.3     Pop fossa AH 15.7  1.7  72.9 Pop fossa - Ankle 45 45 ?41 3.8             SNC    Nerve / Sites Rec. Site Peak Lat Ref.  Amp Ref. Segments Distance    ms ms V V  cm  L Superficial peroneal - Ankle     Lat leg Ankle 3.3 ?4.4 6 ?6 Lat leg - Ankle 14  R Superficial peroneal - Ankle     Lat leg Ankle 2.9 ?4.4 9 ?6 Lat leg - Ankle 14         H Reflex    Nerve H Lat Lat Hmax   ms ms   Left Right Ref. Left Right Ref.  Tibial - [place] 32.1 55.8 ?35.0 32.1  ?35.0       L Tibial - [place]: no response      R  Tibial - S[place]: no respnse    EMG full

## 2017-01-24 NOTE — Procedures (Signed)
     HISTORY:  Jon Pena is a 66 year old gentleman with a history of pancreatic cancer who has reported some problems with walking, sensation of weakness in the legs, slapping the right foot when he walks. The patient is being evaluated for this issue.  NERVE CONDUCTION STUDIES:  Nerve conduction studies were performed on both lower extremities. The distal motor latencies for the peroneal nerves were within normal limits bilaterally with normal motor amplitudes for these nerves. Nerve conduction velocities for the peroneal nerves were normal bilaterally. The distal motor latencies for the posterior tibial nerves were normal bilaterally with a low motor amplitudes for these nerves bilaterally and with slowing seen for the left posterior tibial nerve, normal nerve conduction velocity for the right posterior tibial nerve. The peroneal sensory latencies were within normal limits bilaterally, but the H reflex latencies were unobtainable bilaterally  EMG STUDIES:  EMG study was performed on the right lower extremity:  The tibialis anterior muscle reveals 2 to 5K motor units with decreased recruitment. No fibrillations or positive waves were seen. The peroneus tertius muscle reveals 2 to 6K motor units with markedly decreased recruitment. No fibrillations or positive waves were seen. The medial gastrocnemius muscle reveals 2 to 5K motor units with decreased recruitment. No fibrillations or positive waves were seen. The vastus lateralis muscle reveals 2 to 4K motor units with full recruitment. No fibrillations or positive waves were seen. The iliopsoas muscle reveals 2 to 4K motor units with full recruitment. No fibrillations or positive waves were seen. The biceps femoris muscle (long head) reveals 2 to 4K motor units with full recruitment. No fibrillations or positive waves were seen. The lumbosacral paraspinal muscles were tested at 3 levels, and revealed no abnormalities of insertional activity  at all 3 levels tested. There was good relaxation.   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 Sensory sparing is seen. EMG evaluation of the right lower extremity shows distal chronic stable denervation affecting some of the L5 and S1 innervated muscles. The possibility of overlying chronic lumbosacral radiculopathy is suggested by this study.  Jill Alexanders MD 01/24/2017 1:55 PM  [facility] [address] [place], [place] [address]  Phone [phone] Fax [phone]

## 2017-01-24 NOTE — Progress Notes (Signed)
Please refer to EMG and nerve conduction study procedure note. 

## 2017-01-26 ENCOUNTER — Telehealth: Payer: Self-pay | Admitting: *Deleted

## 2017-01-26 ENCOUNTER — Other Ambulatory Visit: Payer: Self-pay

## 2017-01-26 ENCOUNTER — Telehealth: Payer: Self-pay | Admitting: Gastroenterology

## 2017-01-26 DIAGNOSIS — R1900 Intra-abdominal and pelvic swelling, mass and lump, unspecified site: Secondary | ICD-10-CM

## 2017-01-26 DIAGNOSIS — C259 Malignant neoplasm of pancreas, unspecified: Secondary | ICD-10-CM

## 2017-01-26 LAB — CK: Total CK: 134 U/L (ref 24–204)

## 2017-01-26 LAB — VGCC ANTIBODY: VGCC Antibody: NEGATIVE

## 2017-01-26 LAB — SEDIMENTATION RATE: SED RATE: 2 mm/h (ref 0–30)

## 2017-01-26 LAB — ACETYLCHOLINE RECEPTOR, BINDING

## 2017-01-26 NOTE — Telephone Encounter (Signed)
Brien Few, RN sent to Jeoffrey Massed, RN        Hi Yolando Gillum,  Pt is calling us requesting to be scheduled ASAP. Please call on cell phone. (Mom is at home # with dementia- he may not get the message)   Thanks,  Tanya RN/ Dr. Benay Spice

## 2017-01-26 NOTE — Telephone Encounter (Signed)
Pt came in to office today to report EUS is scheduled for 8/30. He is worried about waiting 3 weeks for a biopsy.  He is interested in pursuing "proton beam radiation" in [place], [place] and is also looking into surgery in [place]. He will need biopsy results before being considered for either. Pt is asking if he should just "pay more to be seen out of network" to get biopsy done. Message to Dr. Benay Spice to discuss with GI.

## 2017-01-26 NOTE — Telephone Encounter (Signed)
EUS scheduled, pt instructed and medications reviewed.  Patient instructions mailed to home.  Patient to call with any questions or concerns.  

## 2017-01-26 NOTE — Telephone Encounter (Signed)
EUS scheduled for 02/15/17 at 1030 am Left message on machine to call back

## 2017-01-29 ENCOUNTER — Telehealth: Payer: Self-pay

## 2017-01-29 NOTE — Telephone Encounter (Signed)
-----   Message from Milus Banister, MD sent at 01/29/2017  7:26 AM EDT ----- [name] get him in sooner. Thanks    Briele Lagasse, Can you call [facility] endo and ask about this Thursday (16th) again. Would like to move my current last MAC case (ms. [name], EGD) out till later that same day but with MODERATE sedation if that is all [facility] can support. Please put Jon Pena in the 1:15 MAC spot instead. He has an 8/30 spot already but I'd like to get it done sooner.  Thanks  dj  ----- Message ----- From: Ladell Pier, MD Sent: 01/28/2017   3:09 PM To: Milus Banister, MD  They scheduled him for EUS biopsy on 8/30 Can you get him in sooner?  He is anxious to establish diagnosis  Thanks,   [name]

## 2017-01-29 NOTE — Telephone Encounter (Signed)
The pt was moved to 8/16 at 1:15 pm.  He has been notified and re instructed.  Pt verbalized understanding

## 2017-01-30 ENCOUNTER — Telehealth: Payer: Self-pay

## 2017-01-30 NOTE — Telephone Encounter (Signed)
Called pt to ask if he wanted to move his OV to 8/21 so MD Benay Spice would have his biopsy results at the [facility]. Pt is agreeable to moving his appointment and verbalizes understanding. Message sent to scheduling to move his OV.

## 2017-02-01 ENCOUNTER — Ambulatory Visit (HOSPITAL_COMMUNITY): Payer: PPO | Admitting: Anesthesiology

## 2017-02-01 ENCOUNTER — Ambulatory Visit (HOSPITAL_COMMUNITY)
Admission: RE | Admit: 2017-02-01 | Discharge: 2017-02-01 | Disposition: A | Discharge status: Home / Self Care | Payer: PPO | Source: Ambulatory Visit | Attending: Gastroenterology | Admitting: Gastroenterology

## 2017-02-01 ENCOUNTER — Telehealth: Payer: Self-pay

## 2017-02-01 ENCOUNTER — Encounter (HOSPITAL_COMMUNITY): Payer: Self-pay

## 2017-02-01 ENCOUNTER — Encounter (HOSPITAL_COMMUNITY)
Admission: RE | Disposition: A | Discharge status: Home / Self Care | Payer: Self-pay | Source: Ambulatory Visit | Attending: Gastroenterology

## 2017-02-01 DIAGNOSIS — I2510 Atherosclerotic heart disease of native coronary artery without angina pectoris: Secondary | ICD-10-CM | POA: Diagnosis not present

## 2017-02-01 DIAGNOSIS — Z87891 Personal history of nicotine dependence: Secondary | ICD-10-CM | POA: Diagnosis not present

## 2017-02-01 DIAGNOSIS — Z923 Personal history of irradiation: Secondary | ICD-10-CM | POA: Insufficient documentation

## 2017-02-01 DIAGNOSIS — C252 Malignant neoplasm of tail of pancreas: Secondary | ICD-10-CM | POA: Diagnosis not present

## 2017-02-01 DIAGNOSIS — Z9221 Personal history of antineoplastic chemotherapy: Secondary | ICD-10-CM | POA: Insufficient documentation

## 2017-02-01 DIAGNOSIS — N281 Cyst of kidney, acquired: Secondary | ICD-10-CM | POA: Diagnosis not present

## 2017-02-01 DIAGNOSIS — Z79899 Other long term (current) drug therapy: Secondary | ICD-10-CM | POA: Insufficient documentation

## 2017-02-01 DIAGNOSIS — R1900 Intra-abdominal and pelvic swelling, mass and lump, unspecified site: Secondary | ICD-10-CM

## 2017-02-01 DIAGNOSIS — I700 Atherosclerosis of aorta: Secondary | ICD-10-CM | POA: Diagnosis not present

## 2017-02-01 DIAGNOSIS — R0602 Shortness of breath: Secondary | ICD-10-CM | POA: Diagnosis not present

## 2017-02-01 DIAGNOSIS — Z7982 Long term (current) use of aspirin: Secondary | ICD-10-CM | POA: Diagnosis not present

## 2017-02-01 DIAGNOSIS — R935 Abnormal findings on diagnostic imaging of other abdominal regions, including retroperitoneum: Secondary | ICD-10-CM

## 2017-02-01 DIAGNOSIS — K8689 Other specified diseases of pancreas: Secondary | ICD-10-CM | POA: Diagnosis not present

## 2017-02-01 DIAGNOSIS — K219 Gastro-esophageal reflux disease without esophagitis: Secondary | ICD-10-CM | POA: Insufficient documentation

## 2017-02-01 HISTORY — PX: EUS: SHX5427

## 2017-02-01 SURGERY — UPPER ENDOSCOPIC ULTRASOUND (EUS) LINEAR
Anesthesia: Monitor Anesthesia Care

## 2017-02-01 MED ORDER — LIDOCAINE 2% (20 MG/ML) 5 ML SYRINGE
INTRAMUSCULAR | Status: AC
  Filled 2017-02-01: qty 5

## 2017-02-01 MED ORDER — PROPOFOL 10 MG/ML IV BOLUS
INTRAVENOUS | Status: DC | PRN
  Administered 2017-02-01: 50 mg via INTRAVENOUS
  Administered 2017-02-01: 20 mg via INTRAVENOUS

## 2017-02-01 MED ORDER — PROPOFOL 10 MG/ML IV BOLUS
INTRAVENOUS | Status: AC
  Filled 2017-02-01: qty 20

## 2017-02-01 MED ORDER — LIDOCAINE 2% (20 MG/ML) 5 ML SYRINGE
INTRAMUSCULAR | Status: DC | PRN
  Administered 2017-02-01: 100 mg via INTRAVENOUS

## 2017-02-01 MED ORDER — PROPOFOL 10 MG/ML IV BOLUS
INTRAVENOUS | Status: AC
  Filled 2017-02-01: qty 40

## 2017-02-01 MED ORDER — LACTATED RINGERS IV SOLN
INTRAVENOUS | Status: DC | PRN
  Administered 2017-02-01: 14:00:00 via INTRAVENOUS

## 2017-02-01 MED ORDER — PROPOFOL 500 MG/50ML IV EMUL
INTRAVENOUS | Status: DC | PRN
  Administered 2017-02-01: 125 ug/kg/min via INTRAVENOUS

## 2017-02-01 NOTE — Anesthesia Procedure Notes (Signed)
Procedure Name: MAC Performed by: Noralyn Pick D Oxygen Delivery Method: Nasal cannula

## 2017-02-01 NOTE — Telephone Encounter (Signed)
Called patient and spoke with him concerning appointment for 8/17. Patient said this appointment date had been changed due to a procedure on 8/16 and test results want be in until 8/20.

## 2017-02-01 NOTE — Transfer of Care (Signed)
Immediate Anesthesia Transfer of Care Note  Patient: Jon Pena  Procedure(s) Performed: Procedure(s): UPPER ENDOSCOPIC ULTRASOUND (EUS) LINEAR (N/A)  Patient Location: PACU  Anesthesia Type:MAC  Level of Consciousness: awake, alert  and oriented  Airway & Oxygen Therapy: Patient Spontanous Breathing and Patient connected to nasal cannula oxygen  Post-op Assessment: Report given to RN and Post -op Vital signs reviewed and stable  Post vital signs: Reviewed and stable  Last Vitals:  Vitals:   02/01/17 1232 02/01/17 1543  BP: 131/75 (!) 114/47  Pulse: (!) 51 (!) 50  Resp: 12 17  Temp: 36.7 C   SpO2: 99% 97%    Last Pain:  Vitals:   02/01/17 1543  TempSrc: Oral         Complications: No apparent anesthesia complications

## 2017-02-01 NOTE — Discharge Instructions (Signed)

## 2017-02-01 NOTE — H&P (View-Only) (Signed)
[facility] OFFICE PROGRESS NOTE   Diagnosis: Pancreas cancer  INTERVAL HISTORY:   Mr. Rhines returns as scheduled. He has abdominal pain. He takes mobile daily. He has developed weakness with dorsi flexion at the right foot. He saw Dr. Jannifer Franklin and is scheduled for nerve conduction studies tomorrow. He also has numbness at the right foot and ankle. He continues to play golf. He reports a good appetite. No difficulty with bowel or bladder function.  Objective:  Vital signs in last 24 hours:  Blood pressure (!) 143/76, pulse (!) 52, temperature 98 F (36.7 C), temperature source Oral, resp. rate 18, height 5\' 10"  (1.778 m), weight 172 lb 9.6 oz (78.3 kg), SpO2 100 %.   Resp: Lungs clear bilaterally Cardio: Regular rate and rhythm GI: No hepatomegaly, no mass, mild tenderness in the left mid lateral abdomen Vascular: No leg edema Neuro: 3+/5 strength with dorsi flexion and he version at the right foot. The motor exam is otherwise intact in the lower extremities. 2+ deep tendon reflexes at the knee bilaterally     Imaging:  Nm Pet Image Initial (pi) Skull Base To Thigh  Result Date: 01/22/2017 CLINICAL DATA:  Initial treatment strategy for pancreatic mass. EXAM: NUCLEAR MEDICINE PET SKULL BASE TO THIGH TECHNIQUE: 80.62 mCi F-18 FDG was injected intravenously. Full-ring PET imaging was performed from the skull base to thigh after the radiotracer. CT data was obtained and used for attenuation correction and anatomic localization. FASTING BLOOD GLUCOSE:  Value: 117 mg/dl COMPARISON:  CT the abdomen and pelvis 12/08/2016. Chest CT 11/01/2015. FINDINGS: NECK: No hypermetabolic lymph nodes in the neck. CHEST: No hypermetabolic mediastinal or hilar nodes. 5 mm nodule in the inferior segment of the lingula (axial image 54 of series 7) demonstrates no hypermetabolism, nonspecific, but smaller than prior study 08/04/2013, likely benign. No other larger more suspicious pulmonary nodules  on the CT scan. There is aortic atherosclerosis, as well as atherosclerosis of the great vessels of the mediastinum and the coronary arteries, including calcified atherosclerotic plaque in the left main, left anterior descending, left circumflex and right coronary arteries. Calcifications of the aortic valve. ABDOMEN/PELVIS: Again noted is a partially calcified soft tissue attenuation mass associated with the body of the pancreas adjacent to the distal pancreatic resection site, which currently measures 2.7 x 3.6 cm (axial image 121 of series 4) and is hypermetabolic (SUVmax = 6.9). Adjacent to this mass intimately associated with the inferior aspect of the proximal fundus of the stomach there is a small soft tissue nodule measuring approximately 1 cm (axial image 118 of series 4) which also is in contact with the anterior aspect of the left adrenal gland, which is hypermetabolic (SUVmax = 4.3). 2 foci of mild hypermetabolism in segment 2 and segment 4B of the liver (SUVmax = 4.5 and 4.5 respectively) are above background at metabolic activity in the liver, suspicious for potential metastatic lesions. These areas demonstrate no discernible lesion on CT images, however.) No abnormal hypermetabolic activity within the adrenal glands, or spleen. No hypermetabolic lymph nodes in the abdomen or pelvis. A few scattered colonic diverticulae are noted, without surrounding inflammatory changes to suggest an acute diverticulitis at this time. Status post appendectomy. SKELETON: No focal hypermetabolic activity to suggest skeletal metastasis. IMPRESSION: 1. The retroperitoneal lesion associated with the body of the pancreas at the resection site in this patient status post distal pancreatectomy is hypermetabolic, highly concerning for local recurrence of disease. There is an adjacent satellite lesion intimately associated with  the anterior aspect of the left adrenal gland and undersurface of the proximal fundus of the stomach,  as well as 2 areas of hypermetabolism in the liver suspicious for potential metastatic lesions. 2. Aortic atherosclerosis, in addition to left main and 3 vessel coronary artery disease. Please note that although the presence of coronary artery calcium documents the presence of coronary artery disease, the severity of this disease and any potential stenosis cannot be assessed on this non-gated CT examination. Assessment for potential risk factor modification, dietary therapy or pharmacologic therapy may be warranted, if clinically indicated. 3. There are calcifications of the aortic valve. Echocardiographic correlation for evaluation of potential valvular dysfunction may be warranted if clinically indicated. 4. Additional incidental findings, as above. Aortic Atherosclerosis (ICD10-I70.0). Electronically Signed   By: Vinnie Langton M.D.   On: 01/22/2017 10:01    Medications: I have reviewed the patient's current medications.  Assessment/Plan: 1. 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  Adjuvant gemcitabine 03/27/2013, 04/03/2013 and 04/10/2013.   Initiation of radiation and capecitabine 04/28/2013. Discontinued 05/22/2013 secondary to nausea.   Adjuvant gemcitabine resumed 06/17/2013. Last gemcitabine given 07/22/2013.  PET scan 01/22/2017-hypermetabolic soft tissue adjacent to the resection site, hypermetabolic adjacent satellite lesion, 2 areas of hypermetabolism in the liver 2. Left renal cyst with a mural nodular component on MRI 02/20/2013. Indeterminate. 3. Acute appendicitis status post laparoscopic appendectomy 01/26/2013. 4. Port-A-Cath placement 03/21/2013, removed 08/26/2013. 5. Right mid to low back sebaceous cyst.  6. History of Rosacea rash over the face. 7. History of tobacco use  8. Screening chest CT 08/05/2013 revealed masslike and nodular opacities in the medial segment of the right middle  lobe and in the inferior aspect of the lingula, followup chest CT 10/31/2013 revealed improvement 9. Cystic-appearing lesion in the body of the pancreas adjacent to the resection line noted on the CT 08/05/2013 and 10/31/2013-discussed with Dr. Barry Dienes, felt to represent postoperative change 10. "Pancreatitis "-managed by primary M.D.   Disposition:  Mr. Siever has persistent abdominal pain. The restaging PET scan is consistent with recurrent pancreas cancer involving a mass at the resection site, a satellite nodule, and potentially liver metastases. I discussed the probable diagnosis and reviewed the PET images with Mr. Hargett. I will refer him to Dr. Ardis Hughs to consider an EUS biopsy.  He has developed a right foot drop. This is most likely related to peripheral nerve compression in the setting of weight loss. He is scheduled for nerve conduction studies with Dr. Jannifer Franklin tomorrow.  We discussed the likely recommendation for systemic chemotherapy if he is proven to have recurrent pancreas cancer.  Mr. Mandley will return for an office visit and further discussion after the biopsy procedure.  He declined prescription pain medication.  25 minutes were spent with the patient today. The majority of the time was used for counseling and coordination of care.  Donneta Romberg, MD  01/23/2017  8:22 AM

## 2017-02-01 NOTE — Op Note (Signed)
[facility] ([place] Patient Name: Jon Pena Procedure Date: 02/01/2017 MRN: [id number] Attending MD: Milus Banister , MD Date of Birth: [birth year] CSN: [id number] Age: 66 Admit Type: Outpatient Procedure:                Upper EUS Indications:              Abnormal abdominal PET scan; 02/2013 tail of                            pancreas resection for adenocarcinoma T3N1;                            adjuvant chemo/XRT; now with enlarging and PET avid                            soft tissue mass abutting remaining pancreas in the                            surgical bed Providers:                Milus Banister, MD, Laverta Baltimore RN, RN,                            Elspeth Cho Tech., Technician Referring MD:             Julieanne Manson, MD Medicines:                Monitored Anesthesia Care Complications:            No immediate complications. Estimated blood loss:                            None. Estimated Blood Loss:     Estimated blood loss: none. Procedure:                Pre-Anesthesia Assessment:                           - Prior to the procedure, a History and Physical                            was performed, and patient medications and                            allergies were reviewed. The patient's tolerance of                            previous anesthesia was also reviewed. The risks                            and benefits of the procedure and the sedation                            options and risks were discussed with the patient.  All questions were answered, and informed consent                            was obtained. Prior Anticoagulants: The patient has                            taken no previous anticoagulant or antiplatelet                            agents. ASA Grade Assessment: II - A patient with                            mild systemic disease. After reviewing the risks                            and benefits, the patient  was deemed in                            satisfactory condition to undergo the procedure.                           After obtaining informed consent, the endoscope was                            passed under direct vision. Throughout the                            procedure, the patient's blood pressure, pulse, and                            oxygen saturations were monitored continuously. The                            Olympus GIF-[id number]) endoscope was introduced                            through the mouth, and advanced to the antrum of                            the stomach. The upper EUS was accomplished without                            difficulty. The patient tolerated the procedure                            well. Scope In: Scope Out: Findings:      Endoscopic Finding :      The examined esophagus was endoscopically normal.      Endosonographic Finding :      1. An irregular mass was found in the region of the surgical bed of 2014       tail of pancreas resection, abutting the remaining pancreas. The mass       was hypoechoic, heterogenous and calcified. The mass measured 2cm by 2cm  in maximal cross-sectional diameter. The endosonographic borders were       poorly-defined. Fine needle aspiration for cytology was performed. Color       Doppler imaging was utilized prior to needle puncture to confirm a lack       of significant vascular structures within the needle path. Five passes       were made with the 25 gauge needle using a transgastric approach. A       cytotechnologist was present to evaluate the adequacy of the specimen.       Final cytology results are pending.      2. The head, neck and proximal body of the pancreas were normal.      3. No peripancreatic adenopathy      4. Limited views of the liver, spleen were normal Impression:               - Irregular, PET avid, 2cm soft tissue mass                            abutting the remaing pancreas in the  region of the                            surgical bed from 2014 distal pancreatectomy. This                            was sampled with FNA, await final cytology results.                            Preliminary reading shows very atypical cells. Moderate Sedation:      N/A- Per Anesthesia Care Recommendation:           - Discharge patient to home (ambulatory).                           - Await cytology results. Procedure Code(s):        --- Professional ---                           [phone], 55, Esophagogastroduodenoscopy, flexible,                            transoral; with transendoscopic ultrasound-guided                            intramural or transmural fine needle                            aspiration/biopsy(s), (includes endoscopic                            ultrasound examination limited to the esophagus,                            stomach or duodenum, and adjacent structures) Diagnosis Code(s):        --- Professional ---  K86.89, Other specified diseases of pancreas                           R93.5, Abnormal findings on diagnostic imaging of                            other abdominal regions, including retroperitoneum CPT copyright 2016 [employer] Association. All rights reserved. The codes documented in this report are preliminary and upon coder review may  be revised to meet current compliance requirements. Milus Banister, MD 02/01/2017 3:47:35 PM This report has been signed electronically. Number of Addenda: 0

## 2017-02-01 NOTE — Anesthesia Preprocedure Evaluation (Signed)
Anesthesia Evaluation  Patient identified by MRN, date of birth, ID band Patient awake    Reviewed: Allergy & Precautions, H&P , NPO status , Patient's Chart, lab work & pertinent test results  Airway Mallampati: II       Dental   Pulmonary shortness of breath, former smoker,    breath sounds clear to auscultation       Cardiovascular + CAD   Rhythm:Regular Rate:Normal     Neuro/Psych    GI/Hepatic Neg liver ROS, GERD  ,  Endo/Other    Renal/GU negative Renal ROS     Musculoskeletal   Abdominal   Peds  Hematology   Anesthesia Other Findings   Reproductive/Obstetrics                             Anesthesia Physical  Anesthesia Plan  ASA: III  Anesthesia Plan: MAC   Post-op Pain Management:    Induction:   PONV Risk Score and Plan: 1 and Ondansetron and Propofol infusion  Airway Management Planned: Nasal Cannula  Additional Equipment:   Intra-op Plan:   Post-operative Plan:   Informed Consent: I have reviewed the patients History and Physical, chart, labs and discussed the procedure including the risks, benefits and alternatives for the proposed anesthesia with the patient or authorized representative who has indicated his/her understanding and acceptance.   Dental advisory given  Plan Discussed with: CRNA  Anesthesia Plan Comments:         Anesthesia Quick Evaluation

## 2017-02-01 NOTE — Interval H&P Note (Signed)
History and Physical Interval Note:  02/01/2017 1:42 PM  Jon Pena  has presented today for surgery, with the diagnosis of panc cancer, abd mass   The various methods of treatment have been discussed with the patient and family. After consideration of risks, benefits and other options for treatment, the patient has consented to  Procedure(s): UPPER ENDOSCOPIC ULTRASOUND (EUS) LINEAR (N/A) as a surgical intervention .  The patient's history has been reviewed, patient examined, no change in status, stable for surgery.  I have reviewed the patient's chart and labs.  Questions were answered to the patient's satisfaction.     Milus Banister

## 2017-02-02 ENCOUNTER — Ambulatory Visit: Payer: PPO | Admitting: Oncology

## 2017-02-02 ENCOUNTER — Encounter (HOSPITAL_COMMUNITY): Payer: Self-pay | Admitting: Gastroenterology

## 2017-02-02 ENCOUNTER — Encounter (HOSPITAL_COMMUNITY): Payer: PPO

## 2017-02-02 NOTE — Anesthesia Postprocedure Evaluation (Signed)
Anesthesia Post Note  Patient: Jon Pena  Procedure(s) Performed: Procedure(s) (LRB): UPPER ENDOSCOPIC ULTRASOUND (EUS) LINEAR (N/A)     Patient location during evaluation: PACU Anesthesia Type: MAC Level of consciousness: awake and alert Pain management: pain level controlled Vital Signs Assessment: post-procedure vital signs reviewed and stable Respiratory status: spontaneous breathing Cardiovascular status: stable Anesthetic complications: no    Last Vitals:  Vitals:   02/01/17 1600 02/01/17 1610  BP: (!) 184/95 (!) 156/87  Pulse: (!) 45 (!) 50  Resp: 15 18  Temp:    SpO2: 97% 99%    Last Pain:  Vitals:   02/01/17 1543  TempSrc: Oral   Pain Goal:                 Jon Pena

## 2017-02-06 ENCOUNTER — Ambulatory Visit (HOSPITAL_BASED_OUTPATIENT_CLINIC_OR_DEPARTMENT_OTHER): Payer: PPO | Admitting: Oncology

## 2017-02-06 ENCOUNTER — Telehealth: Payer: Self-pay | Admitting: Oncology

## 2017-02-06 ENCOUNTER — Ambulatory Visit: Payer: PPO | Admitting: Oncology

## 2017-02-06 VITALS — BP 126/65 | HR 51 | Temp 98.0°F | Resp 18 | Ht 70.0 in | Wt 173.9 lb

## 2017-02-06 DIAGNOSIS — R109 Unspecified abdominal pain: Secondary | ICD-10-CM | POA: Diagnosis not present

## 2017-02-06 DIAGNOSIS — C252 Malignant neoplasm of tail of pancreas: Secondary | ICD-10-CM | POA: Diagnosis not present

## 2017-02-06 MED ORDER — TRAMADOL HCL 50 MG PO TABS: 50.0000 mg | ORAL_TABLET | Freq: Four times a day (QID) | ORAL | 0 refills | Status: DC | PRN

## 2017-02-06 NOTE — Telephone Encounter (Signed)
Scheduled appt per 8/21 los - patient is aware of appt date and time.

## 2017-02-06 NOTE — Progress Notes (Signed)
  Jon Pena OFFICE PROGRESS NOTE   Diagnosis: Pancreas cancer  INTERVAL HISTORY:   Jon Pena returns as scheduled. He underwent an EUS biopsy of a mass in the surgical bed on 02/01/2017. The cytology ([id number]) revealed adenocarcinoma. He has intermittent abdominal pain. He is not taking pain medication. He reports a good appetite. No other complaint.  He is playing golf.  Objective:  Vital signs in last 24 hours:  Blood pressure 126/65, pulse (!) 51, temperature 98 F (36.7 C), temperature source Oral, resp. rate 18, height 5\' 10"  (1.778 m), weight 173 lb 14.4 oz (78.9 kg), SpO2 99 %.    Resp: Lungs clear bilaterally Cardio: Regular rate and rhythm GI: No mass, no hepatosplenomegaly, nontender Vascular: No leg edema   Medications: I have reviewed the patient's current medications.  Assessment/Plan: 1. 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  Adjuvant gemcitabine 03/27/2013, 04/03/2013 and 04/10/2013.   Initiation of radiation and capecitabine 04/28/2013. Discontinued 05/22/2013 secondary to nausea.   Adjuvant gemcitabine resumed 06/17/2013. Last gemcitabine given 07/22/2013.  PET scan 01/22/2017-hypermetabolic soft tissue adjacent to the resection site, hypermetabolic adjacent satellite lesion, 2 areas of hypermetabolism in the liver  EUS biopsy of pancreas bed mass on 02/01/2017-adenocarcinoma 2. Left renal cyst with a mural nodular component on MRI 02/20/2013. Indeterminate. 3. Acute appendicitis status post laparoscopic appendectomy 01/26/2013. 4. Port-A-Cath placement 03/21/2013, removed 08/26/2013. 5. Right mid to low back sebaceous cyst.  6. History of Rosacea rash over the face. 7. History of tobacco use  8. Screening chest CT 08/05/2013 revealed masslike and nodular opacities in the medial segment of the right middle lobe and in the inferior aspect of the  lingula, followup chest CT 10/31/2013 revealed improvement 9. Cystic-appearing lesion in the body of the pancreas adjacent to the resection line noted on the CT 08/05/2013 and 10/31/2013-discussed with Dr. Barry Dienes, felt to represent postoperative change 10. "Pancreatitis "-managed by primary M.D.     Disposition:  Jon Pena has been diagnosed with recurrent pancreas cancer. I discussed the prognosis and treatment options with him. I explained no therapy will be curative. We discussed observation versus a trial of systemic chemotherapy. He maintains a good performance status and has minimal abdominal pain related to the recurrent tumor. We discussed gemcitabine/Abraxane and FOLFIRINOX.  Jon Pena would like to consider resection of the locally recurrent tumor. I explained this will not be curative. He plans to schedule a surgical evaluation in [place]. He also plans to pursue "proton" therapy in [place].  Jon Pena plans to attend a "marijuana "clinic in the [place] for 3 months.  He does not wish to consider chemotherapy. He does not want to schedule a return appointment until December of this year.  I gave him a prescription for tramadol to use as needed for pain. He should not drive while taking pain medication. Jon Pena will contact us if he develops increased pain or new symptoms.  He will be scheduled for an office visit in December. I am available to see him sooner as needed.  25 minutes were spent with the patient today. The majority of the time was used for counseling and coordination of care.  Donneta Romberg, MD  02/06/2017  11:43 AM

## 2017-04-25 DIAGNOSIS — C799 Secondary malignant neoplasm of unspecified site: Secondary | ICD-10-CM | POA: Diagnosis not present

## 2017-04-25 DIAGNOSIS — Z6822 Body mass index (BMI) 22.0-22.9, adult: Secondary | ICD-10-CM | POA: Diagnosis not present

## 2017-04-25 DIAGNOSIS — Z1389 Encounter for screening for other disorder: Secondary | ICD-10-CM | POA: Diagnosis not present

## 2017-04-25 DIAGNOSIS — K529 Noninfective gastroenteritis and colitis, unspecified: Secondary | ICD-10-CM | POA: Diagnosis not present

## 2017-04-25 DIAGNOSIS — C259 Malignant neoplasm of pancreas, unspecified: Secondary | ICD-10-CM | POA: Diagnosis not present

## 2017-04-25 DIAGNOSIS — R7309 Other abnormal glucose: Secondary | ICD-10-CM | POA: Diagnosis not present

## 2017-05-21 DIAGNOSIS — Z6822 Body mass index (BMI) 22.0-22.9, adult: Secondary | ICD-10-CM | POA: Diagnosis not present

## 2017-05-21 DIAGNOSIS — Z0001 Encounter for general adult medical examination with abnormal findings: Secondary | ICD-10-CM | POA: Diagnosis not present

## 2017-06-08 ENCOUNTER — Ambulatory Visit (HOSPITAL_BASED_OUTPATIENT_CLINIC_OR_DEPARTMENT_OTHER): Payer: PPO | Admitting: Oncology

## 2017-06-08 ENCOUNTER — Other Ambulatory Visit: Payer: Self-pay

## 2017-06-08 ENCOUNTER — Ambulatory Visit (HOSPITAL_BASED_OUTPATIENT_CLINIC_OR_DEPARTMENT_OTHER): Payer: PPO

## 2017-06-08 ENCOUNTER — Encounter: Payer: Self-pay | Admitting: Oncology

## 2017-06-08 ENCOUNTER — Telehealth: Payer: Self-pay | Admitting: Oncology

## 2017-06-08 ENCOUNTER — Other Ambulatory Visit (HOSPITAL_COMMUNITY)
Admission: AD | Admit: 2017-06-08 | Discharge: 2017-06-08 | Disposition: A | Discharge status: Home / Self Care | Payer: PPO | Source: Ambulatory Visit | Attending: Oncology | Admitting: Oncology

## 2017-06-08 VITALS — BP 154/66 | HR 50 | Temp 98.5°F | Resp 18 | Ht 70.0 in | Wt 152.9 lb

## 2017-06-08 DIAGNOSIS — R109 Unspecified abdominal pain: Secondary | ICD-10-CM | POA: Diagnosis not present

## 2017-06-08 DIAGNOSIS — C252 Malignant neoplasm of tail of pancreas: Secondary | ICD-10-CM

## 2017-06-08 DIAGNOSIS — R634 Abnormal weight loss: Secondary | ICD-10-CM | POA: Diagnosis not present

## 2017-06-08 DIAGNOSIS — C259 Malignant neoplasm of pancreas, unspecified: Secondary | ICD-10-CM | POA: Insufficient documentation

## 2017-06-08 DIAGNOSIS — R197 Diarrhea, unspecified: Secondary | ICD-10-CM

## 2017-06-08 LAB — COMPREHENSIVE METABOLIC PANEL
ALBUMIN: 4.1 g/dL (ref 3.5–5.0)
ALK PHOS: 73 U/L (ref 40–150)
ALT: 13 U/L (ref 0–55)
AST: 15 U/L (ref 5–34)
Anion Gap: 8 mEq/L (ref 3–11)
BUN: 19.2 mg/dL (ref 7.0–26.0)
CALCIUM: 9.3 mg/dL (ref 8.4–10.4)
CO2: 25 mEq/L (ref 22–29)
CREATININE: 0.9 mg/dL (ref 0.7–1.3)
Chloride: 105 mEq/L (ref 98–109)
EGFR: >60 mL/min/{1.73_m2} (ref >60)
GLUCOSE: 96 mg/dL (ref 70–140)
POTASSIUM: 4.4 meq/L (ref 3.5–5.1)
SODIUM: 138 meq/L (ref 136–145)
TOTAL PROTEIN: 7 g/dL (ref 6.4–8.3)
Total Bilirubin: 0.53 mg/dL (ref 0.20–1.20)

## 2017-06-08 LAB — CBC WITH DIFFERENTIAL/PLATELET
BASO%: 0.6 % (ref 0.0–2.0)
BASOS ABS: 0.1 10*3/uL (ref 0.0–0.1)
EOS%: 3.7 % (ref 0.0–7.0)
Eosinophils Absolute: 0.3 10*3/uL (ref 0.0–0.5)
HEMATOCRIT: 43.4 % (ref 38.4–49.9)
HEMOGLOBIN: 14.9 g/dL (ref 13.0–17.1)
LYMPH#: 2.2 10*3/uL (ref 0.9–3.3)
LYMPH%: 26 % (ref 14.0–49.0)
MCH: 31.9 pg (ref 27.2–33.4)
MCHC: 34.3 g/dL (ref 32.0–36.0)
MCV: 92.9 fL (ref 79.3–98.0)
MONO#: 0.8 10*3/uL (ref 0.1–0.9)
MONO%: 9.3 % (ref 0.0–14.0)
NEUT%: 60.4 % (ref 39.0–75.0)
NEUTROS ABS: 5.1 10*3/uL (ref 1.5–6.5)
Platelets: 246 10*3/uL (ref 140–400)
RBC: 4.67 10*6/uL (ref 4.20–5.82)
RDW: 14.3 % (ref 11.0–14.6)
WBC: 8.5 10*3/uL (ref 4.0–10.3)

## 2017-06-08 LAB — C DIFFICILE QUICK SCREEN W PCR REFLEX
C DIFFICLE (CDIFF) ANTIGEN: NEGATIVE
C Diff interpretation: NOT DETECTED
C Diff toxin: NEGATIVE

## 2017-06-08 MED ORDER — MELOXICAM 15 MG PO TABS: 15.0000 mg | ORAL_TABLET | Freq: Every day | ORAL | 1 refills | Status: DC

## 2017-06-08 MED ORDER — PANCRELIPASE (LIP-PROT-AMYL) 36000-114000 UNITS PO CPEP: 36000.0000 [IU] | ORAL_CAPSULE | Freq: Three times a day (TID) | ORAL | 1 refills | Status: DC

## 2017-06-08 NOTE — Progress Notes (Signed)
[facility] OFFICE PROGRESS NOTE   Diagnosis: Pancreas cancer  INTERVAL HISTORY:   Jon Pena was last seen at the Cancer center in August.  He was seen in an alternative medical clinic in [place].  He has continued an alternative medical regimen for the past several months.  He feels this is helping his cancer. He has occasional abdominal pain.  He is losing weight.  Jon Pena reports frequent loose bowel movements.  Lomotil helps partially.  The loose stools are worse after eating.  He feels the PET tracer caused the diarrhea.  Objective:  Vital signs in last 24 hours:  Blood pressure (!) 154/66, pulse (!) 50, temperature 98.5 F (36.9 C), temperature source Oral, resp. rate 18, height 5\' 10"  (1.778 m), weight 152 lb 14.4 oz (69.4 kg), SpO2 97 %.   Resp: Lungs clear bilaterally Cardio: Regular rate and rhythm GI: No hepatomegaly, no mass, nontender, no apparent ascites Vascular: No leg edema     Lab Results:  Lab Results  Component Value Date   WBC 8.5 06/08/2017   HGB 14.9 06/08/2017   HCT 43.4 06/08/2017   MCV 92.9 06/08/2017   PLT 246 06/08/2017   NEUTROABS 5.1 06/08/2017    CMP     Component Value Date/Time   NA 138 06/08/2017 1306   K 4.4 06/08/2017 1306   CL 96 03/04/2013 1140   CO2 25 06/08/2017 1306   GLUCOSE 96 06/08/2017 1306   BUN 19.2 06/08/2017 1306   CREATININE 0.9 06/08/2017 1306   CALCIUM 9.3 06/08/2017 1306   PROT 7.0 06/08/2017 1306   ALBUMIN 4.1 06/08/2017 1306   AST 15 06/08/2017 1306   ALT 13 06/08/2017 1306   ALKPHOS 73 06/08/2017 1306   BILITOT 0.53 06/08/2017 1306   GFRNONAA >90 03/04/2013 1140   GFRAA >90 03/04/2013 1140     Medications: I have reviewed the patient's current medications.   Assessment/Plan: 1. 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  Adjuvant gemcitabine 03/27/2013, 04/03/2013 and 04/10/2013.   Initiation  of radiation and capecitabine 04/28/2013. Discontinued 05/22/2013 secondary to nausea.   Adjuvant gemcitabine resumed 06/17/2013. Last gemcitabine given 07/22/2013.  PET scan 01/22/2017-hypermetabolic soft tissue adjacent to the resection site, hypermetabolic adjacent satellite lesion, 2 areas of hypermetabolism in the liver  EUS biopsy of pancreas bed mass on 02/01/2017-adenocarcinoma 2. Left renal cyst with a mural nodular component on MRI 02/20/2013. Indeterminate. 3. Acute appendicitis status post laparoscopic appendectomy 01/26/2013. 4. Port-A-Cath placement 03/21/2013, removed 08/26/2013. 5. Right mid to low back sebaceous cyst.  6. History of Rosacea rash over the face. 7. History of tobacco use  8. Screening chest CT 08/05/2013 revealed masslike and nodular opacities in the medial segment of the right middle lobe and in the inferior aspect of the lingula, followup chest CT 10/31/2013 revealed improvement 9. Cystic-appearing lesion in the body of the pancreas adjacent to the resection line noted on the CT 08/05/2013 and 10/31/2013-discussed with Dr. Barry Dienes, felt to represent postoperative change 10. "Pancreatitis "-managed by primary M.D.   Disposition: [name] has metastatic pancreas cancer.  He has lost a significant amount of weight over the past few months.  I am concerned this is secondary to progression of the pancreas cancer.  He feels the cancer is improving.  He agrees to a repeat CA 19-9 and restaging CT.  The diarrhea may be related to pancreas insufficiency.  He will begin a trial of pancreas  enzyme replacement.  I have a low clinical suspicion for a C. difficile infection.  A stool sample today was formed.  Jon Pena will return for an office visit in 2 weeks.  25 minutes were spent with the patient today.  The majority of the time was used for counseling and coordination of care.  Betsy Coder, MD  06/08/2017  2:03 PM

## 2017-06-08 NOTE — Telephone Encounter (Signed)
Scheduled Appt Per 12/21 los - Gave patient AVS and calender per los.

## 2017-06-09 LAB — CANCER ANTIGEN 19-9: CAN 19-9: 1 U/mL (ref 0–35)

## 2017-06-11 ENCOUNTER — Telehealth: Payer: Self-pay | Admitting: *Deleted

## 2017-06-11 NOTE — Telephone Encounter (Signed)
Called pt with normal tumor marker result. He voiced understanding. Informed pt of negative CDiff result as well. Pt reports he continues to have BM after each meal. Instructed him to continue pancreas enzymes. Message to managed care for CT auth. Informed pt that radiology will contact him with appt.

## 2017-06-11 NOTE — Telephone Encounter (Signed)
-----   Message from Ladell Pier, MD sent at 06/10/2017 11:30 AM EST ----- Please call patient, ca19-9 is normal, f/u as scheduled

## 2017-06-14 ENCOUNTER — Telehealth: Payer: Self-pay | Admitting: Emergency Medicine

## 2017-06-14 NOTE — Telephone Encounter (Signed)
Pt called c/o of increased diarrhea that is relieved mildly with pancreatic enzymes. Pt tolerating fluids. Pt feels fatigued and states "im melting away". Pt has been seen with Dr.Golden who referred him to GI. Pt called to make [place] aware. Ned Card made aware of patients s/s. Lisa instructed patient to increase pancreatic enzymes to 2 tablets 3xdaily. Pt verbalized understanding of this. CT scheduling notified to call patient to schedule CT. Patient is to see Dr.Sherril on 1/4. Pt knows to call back for any increased diarrhea or other symptoms. Pt denies fever/ chills. Pt verbalized understanding.

## 2017-06-18 ENCOUNTER — Ambulatory Visit (HOSPITAL_COMMUNITY): Payer: PPO

## 2017-06-18 ENCOUNTER — Ambulatory Visit (HOSPITAL_COMMUNITY)
Admission: RE | Admit: 2017-06-18 | Discharge: 2017-06-18 | Disposition: A | Discharge status: Home / Self Care | Payer: PPO | Source: Ambulatory Visit | Attending: Oncology | Admitting: Oncology

## 2017-06-18 DIAGNOSIS — N289 Disorder of kidney and ureter, unspecified: Secondary | ICD-10-CM | POA: Diagnosis not present

## 2017-06-18 DIAGNOSIS — C252 Malignant neoplasm of tail of pancreas: Secondary | ICD-10-CM

## 2017-06-18 DIAGNOSIS — Z90411 Acquired partial absence of pancreas: Secondary | ICD-10-CM | POA: Insufficient documentation

## 2017-06-18 DIAGNOSIS — I700 Atherosclerosis of aorta: Secondary | ICD-10-CM | POA: Insufficient documentation

## 2017-06-18 DIAGNOSIS — R197 Diarrhea, unspecified: Secondary | ICD-10-CM | POA: Diagnosis not present

## 2017-06-18 MED ORDER — IOPAMIDOL (ISOVUE-300) INJECTION 61%
100.0000 mL | Freq: Once | INTRAVENOUS | Status: AC | PRN
  Administered 2017-06-18: 100 mL via INTRAVENOUS

## 2017-06-18 MED ORDER — IOPAMIDOL (ISOVUE-300) INJECTION 61%
INTRAVENOUS | Status: AC
  Filled 2017-06-18: qty 100

## 2017-06-22 ENCOUNTER — Ambulatory Visit (HOSPITAL_BASED_OUTPATIENT_CLINIC_OR_DEPARTMENT_OTHER): Payer: PPO | Admitting: Oncology

## 2017-06-22 ENCOUNTER — Encounter (INDEPENDENT_AMBULATORY_CARE_PROVIDER_SITE_OTHER): Payer: Self-pay | Admitting: Internal Medicine

## 2017-06-22 ENCOUNTER — Telehealth: Payer: Self-pay | Admitting: Oncology

## 2017-06-22 ENCOUNTER — Other Ambulatory Visit: Payer: Self-pay | Admitting: Emergency Medicine

## 2017-06-22 VITALS — BP 125/72 | HR 58 | Temp 98.1°F | Resp 17 | Ht 70.0 in | Wt 154.3 lb

## 2017-06-22 DIAGNOSIS — G893 Neoplasm related pain (acute) (chronic): Secondary | ICD-10-CM | POA: Diagnosis not present

## 2017-06-22 DIAGNOSIS — R197 Diarrhea, unspecified: Secondary | ICD-10-CM | POA: Diagnosis not present

## 2017-06-22 DIAGNOSIS — N289 Disorder of kidney and ureter, unspecified: Secondary | ICD-10-CM

## 2017-06-22 DIAGNOSIS — C252 Malignant neoplasm of tail of pancreas: Secondary | ICD-10-CM | POA: Diagnosis not present

## 2017-06-22 MED ORDER — HYDROCODONE-ACETAMINOPHEN 5-325 MG PO TABS: 1.0000 | ORAL_TABLET | ORAL | 0 refills | Status: DC | PRN

## 2017-06-22 NOTE — Progress Notes (Signed)
[facility] OFFICE PROGRESS NOTE   Diagnosis: Pancreas cancer  INTERVAL HISTORY:   Mr. Cuccaro returns as scheduled.  He continues to have diarrhea.  The pancreas enzyme replacement has helped partially.  He is scheduled to see Dr.Rehman in a few weeks. Abdominal pain is not relieved with tramadol.  He reports a good appetite.  He continues alternative treatment for the pancreas cancer.  Objective:  Vital signs in last 24 hours:  Blood pressure 125/72, pulse (!) 58, temperature 98.1 F (36.7 C), temperature source Oral, resp. rate 17, height 5\' 10"  (1.778 m), weight 154 lb 4.8 oz (70 kg), SpO2 100 %.    Resp: Lungs clear bilaterally Cardio: Regular rate and rhythm GI: No hepatomegaly, no mass, nontender Vascular: No leg edema      Lab Results:  Lab Results  Component Value Date   WBC 8.5 06/08/2017   HGB 14.9 06/08/2017   HCT 43.4 06/08/2017   MCV 92.9 06/08/2017   PLT 246 06/08/2017   NEUTROABS 5.1 06/08/2017    CMP     Component Value Date/Time   NA 138 06/08/2017 1306   K 4.4 06/08/2017 1306   CL 96 03/04/2013 1140   CO2 25 06/08/2017 1306   GLUCOSE 96 06/08/2017 1306   BUN 19.2 06/08/2017 1306   CREATININE 0.9 06/08/2017 1306   CALCIUM 9.3 06/08/2017 1306   PROT 7.0 06/08/2017 1306   ALBUMIN 4.1 06/08/2017 1306   AST 15 06/08/2017 1306   ALT 13 06/08/2017 1306   ALKPHOS 73 06/08/2017 1306   BILITOT 0.53 06/08/2017 1306   GFRNONAA >90 03/04/2013 1140   GFRAA >90 03/04/2013 1140    No results found for: CEA1  Lab Results  Component Value Date   INR 0.95 02/25/2013    Imaging:  Ct Abdomen Pelvis W Contrast  Result Date: 06/18/2017 CLINICAL DATA:  Followup of pancreatic cancer. Appendectomy and splenectomy. Partial pancreatectomy. Diarrhea for 4 months. Weight loss. EXAM: CT ABDOMEN AND PELVIS WITH CONTRAST TECHNIQUE: Multidetector CT imaging of the abdomen and pelvis was performed using the standard protocol following bolus  administration of intravenous contrast. CONTRAST:  154mL ISOVUE-300 IOPAMIDOL (ISOVUE-300) INJECTION 61% COMPARISON:  PET 01/22/2017.   CT 12/08/2016. FINDINGS: Lower chest: Clear lung bases. Normal heart size without pericardial or pleural effusion. Hepatobiliary: Possible heterogeneous hepatic steatosis, without well-defined liver lesion. Normal gallbladder, without biliary ductal dilatation. Pancreas: Distal pancreatectomy. Infiltrative partially calcified soft tissue mass centered to the left of the superior mesenteric artery measures on the order of 3.6 x 3.3 cm on image 22/series 2. Compare 3.8 x 2.2 cm on the prior exam. Soft tissue currently obscures the anterior portion of the left adrenal gland, progressive since the prior. There is also extension superiorly, with presumed tumor just left of the celiac on image 19/series 2 (compare image 27/series 2 of the 12/08/2016 exam). Similarly, this measures 3.8 cm craniocaudal on coronal image 46 today versus 2.4 cm on the prior exam (when remeasured). Spleen: Splenectomy. Adrenals/Urinary Tract: Normal right adrenal gland. The left adrenal is intimately associated with the infiltrative tumor as detailed above. An interpolar right renal too small to characterize lesion. Upper pole 1.6 cm left renal lesion has enlarged from 1.3 cm when measured similarly on the prior. Normal urinary bladder. Stomach/Bowel: Gastric antral underdistention. Extensive colonic diverticulosis. Normal terminal ileum. Normal small bowel. Vascular/Lymphatic: Aortic and branch vessel atherosclerosis. Celiac and superior mesenteric involvement by recurrent tumor. No separate areas of abdominal adenopathy. No pelvic sidewall adenopathy. Reproductive:  Normal prostate. Other: No significant free fluid. Musculoskeletal: Grade 1 L4-5 anterolisthesis. IMPRESSION: 1. Interval progression of infiltrative tumor recurrence within the retroperitoneum, in this patient who is status post partial  pancreatectomy for adenocarcinoma. 2. No new sites of disease identified. No evidence of hepatic metastasis. 3. interval enlargement of a left renal lesion which demonstrates apparent enhancement and is suspicious for a synchronous renal cell carcinoma. Given comorbidities, of doubtful clinical significance. 4.  Aortic Atherosclerosis (ICD10-I70.0). Electronically Signed   By: Abigail Miyamoto M.D.   On: 06/18/2017 19:51  CT images were reviewed with Mr. Soyars  Medications: I have reviewed the patient's current medications.   Assessment/Plan: 1. 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  Adjuvant gemcitabine 03/27/2013, 04/03/2013 and 04/10/2013.   Initiation of radiation and capecitabine 04/28/2013. Discontinued 05/22/2013 secondary to nausea.   Adjuvant gemcitabine resumed 06/17/2013. Last gemcitabine given 07/22/2013.  PET scan 01/22/2017-hypermetabolic soft tissue adjacent to the resection site, hypermetabolic adjacent satellite lesion, 2 areas of hypermetabolism in the liver  EUS biopsy of pancreas bed mass on 02/01/2017-adenocarcinoma  CT abdomen/pelvis 06/18/2017-partially calcified soft tissue mass to the left of the SMA is slightly larger with extension superior to the left of the celiac and to the left adrenal gland, no new sites of disease.  1.6 cm left renal lesion has enlarged from 1.3 cm 2. Left renal cyst with a mural nodular component on MRI 02/20/2013. Indeterminate. 3. Acute appendicitis status post laparoscopic appendectomy 01/26/2013. 4. Port-A-Cath placement 03/21/2013, removed 08/26/2013. 5. Right mid to low back sebaceous cyst.  6. History of Rosacea rash over the face. 7. History of tobacco use  8. Screening chest CT 08/05/2013 revealed masslike and nodular opacities in the medial segment of the right middle lobe and in the inferior aspect of the lingula, followup chest CT 10/31/2013  revealed improvement 9. Cystic-appearing lesion in the body of the pancreas adjacent to the resection line noted on the CT 08/05/2013 and 10/31/2013-discussed with Dr. Barry Dienes, felt to represent postoperative change 10. "Pancreatitis "-managed by primary M.D.   Disposition: Mr. Moscato appears unchanged.  A restaging CT reveals enlargement of the pancreas bed mass compared to scans from last summer.  I reviewed the images with Mr. Meyerhoff.  I recommend systemic chemotherapy.  He does not wish to consider chemotherapy.  He plans to continue alternative therapy.  He continues to have diarrhea, potentially related to pancreas insufficiency.  He is scheduled to see Dr. Laural Golden later this month.  A stool sample for C. difficile was negative on 06/08/2017.  He will continue pancreatic enzyme replacement.  He has abdominal pain secondary to the pancreas bed mass.  Tramadol has not helped.  We prescribed hydrocodone.  Mr. Lagrand will return for an office visit in 1 month.  25 minutes were spent with the patient today.  The majority of the time was used for counseling and coordination of care.  Betsy Coder, MD  06/22/2017  1:22 PM

## 2017-06-22 NOTE — Telephone Encounter (Signed)
Gave avs and calendar for February  °

## 2017-06-25 ENCOUNTER — Other Ambulatory Visit: Payer: Self-pay

## 2017-06-25 DIAGNOSIS — C252 Malignant neoplasm of tail of pancreas: Secondary | ICD-10-CM

## 2017-06-25 MED ORDER — PANCRELIPASE (LIP-PROT-AMYL) 36000-114000 UNITS PO CPEP: 72000.0000 [IU] | ORAL_CAPSULE | Freq: Three times a day (TID) | ORAL | 1 refills | Status: DC

## 2017-06-25 NOTE — Telephone Encounter (Signed)
Call from [facility] at [facility], pt came in for Creon refill since he ran out early due to dose increase. Refill escribed to pharmacy.

## 2017-07-03 DIAGNOSIS — D3102 Benign neoplasm of left conjunctiva: Secondary | ICD-10-CM | POA: Diagnosis not present

## 2017-07-09 ENCOUNTER — Ambulatory Visit (INDEPENDENT_AMBULATORY_CARE_PROVIDER_SITE_OTHER): Payer: PPO | Admitting: Internal Medicine

## 2017-07-09 ENCOUNTER — Encounter (INDEPENDENT_AMBULATORY_CARE_PROVIDER_SITE_OTHER): Payer: Self-pay | Admitting: Internal Medicine

## 2017-07-09 VITALS — BP 128/80 | HR 72 | Temp 98.5°F | Ht 71.0 in | Wt 157.6 lb

## 2017-07-09 DIAGNOSIS — K8689 Other specified diseases of pancreas: Secondary | ICD-10-CM | POA: Diagnosis not present

## 2017-07-09 DIAGNOSIS — K219 Gastro-esophageal reflux disease without esophagitis: Secondary | ICD-10-CM

## 2017-07-09 DIAGNOSIS — C252 Malignant neoplasm of tail of pancreas: Secondary | ICD-10-CM

## 2017-07-09 DIAGNOSIS — R197 Diarrhea, unspecified: Secondary | ICD-10-CM | POA: Diagnosis not present

## 2017-07-09 MED ORDER — PANTOPRAZOLE SODIUM 40 MG PO TBEC: 40.0000 mg | DELAYED_RELEASE_TABLET | Freq: Every day | ORAL | 3 refills | Status: AC

## 2017-07-09 NOTE — Progress Notes (Addendum)
Subjective:    Patient ID: Jon Pena, male    DOB: 09-May-1951, 67 y.o.   MRN: [id number]  HPI Referred by Dr. Hilma Favors for diarrhea x 120 days. Hx of same. Has been started on  Creon 36000 (Take 2 caps) by mouth three times a day. Stool studies for C-diff x 2 have been negative. Patient has significant hx of recurrent pancreatic cancer.  Underwent an EUS by Dr. Ardis Hughs which revealed adenocarcinoma of the pancrease.  Hx of adenocarcinoma of the tail of the pancreas,stage 11B status post distal pancreatectomy/splenecdtomy in 2015.  Underwent adjjunvant gemcitabine and radiation in 2015. Was discontinued 05/2013 secondary to nausea.  States today he has had diarrhea for about 140 days.  Stools mainly are watery. Stools usually occur all during the day. Worse in the afternoon.' He is having 6-12 stools a day. He is taking Gas X for gas.  He is taking Lomotil 3 times a day. He thinks what is helping more than anything is he has adjusted his diet. He is not eating as much red meat or pork.  His appetite is good. He has lost 25 pounds in the past 3-4 months. He states he is treating his pancreatic naturally  Martin Majestic to [place] in September).   Pain across his upper abdomen since August 2018.   Diarrhea started after the PET scan in August.  Has been on Creon for about 3 weeks. Takes before his meals.   04/25/2017 H and H 16.0 and 45.2  06/18/2017 CT abdomen/pelvis w CM: follow up of pancreatic cancer. IMPRESSION: 1. Interval progression of infiltrative tumor recurrence within the retroperitoneum, in this patient who is status post partial pancreatectomy for adenocarcinoma. 2. No new sites of disease identified. No evidence of hepatic metastasis. 3. interval enlargement of a left renal lesion which demonstrates apparent enhancement and is suspicious for a synchronous renal cell carcinoma. Given comorbidities, of doubtful clinical significance.  01/22/2017 NM PET image initial skull base  to thigh IMPRESSION: 1. The retroperitoneal lesion associated with the body of the pancreas at the resection site in this patient status post distal pancreatectomy is hypermetabolic, highly concerning for local recurrence of disease. There is an adjacent satellite lesion intimately associated with the anterior aspect of the left adrenal gland and undersurface of the proximal fundus of the stomach, as well as 2 areas of hypermetabolism in the liver suspicious for potential metastatic lesions. 2. Aortic atherosclerosis, in addition to left main and 3 vessel coronary artery disease. Please note that although the presence of coronary artery calcium documents the presence of coronary artery disease, the severity of this disease and any potential stenosis cannot be assessed on this non-gated CT examination. Assessment for potential risk factor modification, dietary therapy or pharmacologic therapy may be warranted, if clinically indicated. 3. There are calcifications of the aortic valve. Echocardiographic correlation for evaluation of potential valvular dysfunction may be warranted if clinically indicated. 4. Additional incidental findings, as above. Aortic Atherosclerosis (ICD10-I70.0).  His last colonoscopy was in 2016 by Dr. Ardis Hughs (screening).  One polyp which was hyperplastic.  Review of Systems Past Medical History:  Diagnosis Date  . GERD (gastroesophageal reflux disease)   . HOH (hard of hearing)   . Hyperlipidemia   . Pancreatic adenocarcinoma ([place]) 02/27/13   Stage IIB (T3 N1) status post distal pancreatectomy/splenectomy, status post chemotherapy and XRT  . Pancreatitis     Past Surgical History:  Procedure Laterality Date  . APPENDECTOMY    . COLONOSCOPY    .  EUS N/A 02/20/2013   Procedure: UPPER ENDOSCOPIC ULTRASOUND (EUS) LINEAR;  Surgeon: Milus Banister, MD;  Location: [facility];  Service: Endoscopy;  Laterality: N/A;  . EUS N/A 02/01/2017   Procedure: UPPER  ENDOSCOPIC ULTRASOUND (EUS) LINEAR;  Surgeon: Milus Banister, MD;  Location: [facility];  Service: Endoscopy;  Laterality: N/A;  . LAPAROSCOPIC APPENDECTOMY N/A 01/26/2013   Procedure: APPENDECTOMY LAPAROSCOPIC;  Surgeon: Zenovia Jarred, MD;  Location: [facility];  Service: General;  Laterality: N/A;  . LAPAROSCOPIC SPLENECTOMY N/A 02/27/2013   Procedure: LAPAROSCOPIC SPLENECTOMY;  Surgeon: Stark Klein, MD;  Location: [facility];  Service: General;  Laterality: N/A;  . PANCREATECTOMY N/A 02/27/2013   Procedure: LAPAROSCOPIC PANCREATECTOMY;  Surgeon: Stark Klein, MD;  Location: [facility];  Service: General;  Laterality: N/A;  . POLYPECTOMY    . PORT-A-CATH REMOVAL Left 08/26/2013   Procedure: REMOVAL PORT-A-CATH;  Surgeon: Stark Klein, MD;  Location: [place];  Service: General;  Laterality: Left;  . PORTACATH PLACEMENT N/A 03/21/2013   Procedure: INSERTION PORT-A-CATH;  Surgeon: Stark Klein, MD;  Location: [facility];  Service: General;  Laterality: N/A;  . TONSILLECTOMY      No Known Allergies  Current Outpatient Medications on File Prior to Visit  Medication Sig Dispense Refill  . aspirin EC 81 MG tablet Take 81 mg by mouth at bedtime.    . diphenoxylate-atropine (LOMOTIL) 2.5-0.025 MG tablet Take 1 tablet by mouth 4 (four) times daily as needed.    . famotidine (PEPCID) 10 MG tablet Take 10 mg by mouth 2 (two) times daily as needed for heartburn.     Marland Kitchen HYDROcodone-acetaminophen (NORCO/VICODIN) 5-325 MG tablet Take 1 tablet by mouth every 4 (four) hours as needed for moderate pain (Take 1 Tablet every 4 hours as needed for pain). 60 tablet 0  . lipase/protease/amylase (CREON) 36000 UNITS CPEP capsule Take 2 capsules (72,000 Units total) by mouth 3 (three) times daily before meals. 180 capsule 1  . meloxicam (MOBIC) 15 MG tablet Take 1 tablet (15 mg total) by mouth daily. 30 tablet 1  . ondansetron (ZOFRAN-ODT) 4 MG disintegrating tablet Take 4 mg by mouth every 6  (six) hours as needed.    Vladimir Faster Glycol-Propyl Glycol (LUBRICANT EYE DROPS) 0.4-0.3 % SOLN Place 1 drop into both eyes 3 (three) times daily as needed (for dry/irritated eyes.).     No current facility-administered medications on file prior to visit.         Objective:   Physical Exam Blood pressure 128/80, pulse 72, temperature 98.5 F (36.9 C), height 5\' 11"  (1.803 m), weight 157 lb 9.6 oz (71.5 kg).Alert and oriented. Skin warm and dry. Oral mucosa is moist.   . Sclera anicteric, conjunctivae is pink. Thyroid not enlarged. No cervical lymphadenopathy. Lungs clear. Heart regular rate and rhythm.  Abdomen is soft. Bowel sounds are positive. No hepatomegaly. No abdominal masses felt.LUQ tenderness.  No edema to lower extremities.           Assessment & Plan:  Diarrhea over 140 days. Hx of pancreatic cancer. Wt loss of 25 pounds past 3-4 months. Continue the Creon. Rx for Protonix sent to his pharmacy. Stool diary in 2 weeks.

## 2017-07-09 NOTE — Patient Instructions (Addendum)
Fecal fat.  Take two bites of food take one pill,  With 1/3 of meal take pill, End of meal take other pill. One with snack. Stool diary in 2 weeks and send to [place].

## 2017-07-16 ENCOUNTER — Telehealth (INDEPENDENT_AMBULATORY_CARE_PROVIDER_SITE_OTHER): Payer: Self-pay | Admitting: Internal Medicine

## 2017-07-16 ENCOUNTER — Telehealth (INDEPENDENT_AMBULATORY_CARE_PROVIDER_SITE_OTHER): Payer: Self-pay | Admitting: *Deleted

## 2017-07-16 DIAGNOSIS — C252 Malignant neoplasm of tail of pancreas: Secondary | ICD-10-CM

## 2017-07-16 MED ORDER — PANCRELIPASE (LIP-PROT-AMYL) 36000-114000 UNITS PO CPEP: ORAL_CAPSULE | ORAL | 3 refills | Status: DC

## 2017-07-16 NOTE — Telephone Encounter (Signed)
Rx has been sent  

## 2017-07-16 NOTE — Telephone Encounter (Signed)
MaryAnn at [facility] called stating that the patient is currently taking creon 36000 units 2 capsules 3 times daily that his cancer doctor has prescribed. Velta Addison stated the patient told her that Terri wants him to take 3 capsules daily, Velta Addison is requesting a prescription to qty of 270. Please advise [phone]

## 2017-07-23 ENCOUNTER — Encounter: Payer: Self-pay | Admitting: Oncology

## 2017-07-23 ENCOUNTER — Telehealth: Payer: Self-pay | Admitting: Oncology

## 2017-07-23 ENCOUNTER — Inpatient Hospital Stay: Payer: PPO | Attending: Oncology | Admitting: Oncology

## 2017-07-23 VITALS — BP 122/65 | HR 50 | Temp 97.8°F | Resp 18 | Ht 71.0 in | Wt 153.2 lb

## 2017-07-23 DIAGNOSIS — Z87891 Personal history of nicotine dependence: Secondary | ICD-10-CM

## 2017-07-23 DIAGNOSIS — G893 Neoplasm related pain (acute) (chronic): Secondary | ICD-10-CM | POA: Diagnosis not present

## 2017-07-23 DIAGNOSIS — L719 Rosacea, unspecified: Secondary | ICD-10-CM | POA: Diagnosis not present

## 2017-07-23 DIAGNOSIS — Z90411 Acquired partial absence of pancreas: Secondary | ICD-10-CM

## 2017-07-23 DIAGNOSIS — C252 Malignant neoplasm of tail of pancreas: Secondary | ICD-10-CM

## 2017-07-23 DIAGNOSIS — Z9221 Personal history of antineoplastic chemotherapy: Secondary | ICD-10-CM | POA: Diagnosis not present

## 2017-07-23 DIAGNOSIS — Z9081 Acquired absence of spleen: Secondary | ICD-10-CM | POA: Diagnosis not present

## 2017-07-23 DIAGNOSIS — L723 Sebaceous cyst: Secondary | ICD-10-CM

## 2017-07-23 DIAGNOSIS — Z923 Personal history of irradiation: Secondary | ICD-10-CM | POA: Diagnosis not present

## 2017-07-23 DIAGNOSIS — N281 Cyst of kidney, acquired: Secondary | ICD-10-CM

## 2017-07-23 DIAGNOSIS — C250 Malignant neoplasm of head of pancreas: Secondary | ICD-10-CM

## 2017-07-23 MED ORDER — OXYCODONE-ACETAMINOPHEN 5-325 MG PO TABS: 1.0000 | ORAL_TABLET | ORAL | 0 refills | Status: DC | PRN

## 2017-07-23 NOTE — Progress Notes (Signed)
  [facility] OFFICE PROGRESS NOTE   Diagnosis: Pancreas cancer  INTERVAL HISTORY:   Jon Pena returns as scheduled.  He continues to have approximately 5 bowel movements per day.  He was seen by gastroenterology and prescribed Protonix.  He is scheduled to return a stool diary to gastroenterology.  He continues to have intermittent abdominal pain.  The pain is not adequately relieved with hydrocodone.  He reports a good appetite.  He continues alternative treatment for the pancreas cancer.  Objective:  Vital signs in last 24 hours:  Blood pressure 122/65, pulse (!) 50, temperature 97.8 F (36.6 C), temperature source Oral, resp. rate 18, height 5\' 11"  (1.803 m), weight 153 lb 3.2 oz (69.5 kg), SpO2 99 %.  Resp: Lungs clear bilaterally Cardio: Regular rate and rhythm GI: No hepatomegaly, no mass, nontender Vascular: No leg edema    Medications: I have reviewed the patient's current medications.   Assessment/Plan: 1. 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  Adjuvant gemcitabine 03/27/2013, 04/03/2013 and 04/10/2013.   Initiation of radiation and capecitabine 04/28/2013. Discontinued 05/22/2013 secondary to nausea.   Adjuvant gemcitabine resumed 06/17/2013. Last gemcitabine given 07/22/2013.  PET scan 01/22/2017-hypermetabolic soft tissue adjacent to the resection site, hypermetabolic adjacent satellite lesion, 2 areas of hypermetabolism in the liver  EUS biopsy of pancreas bed mass on 02/01/2017-adenocarcinoma  CT abdomen/pelvis 06/18/2017-partially calcified soft tissue mass to the left of the SMA is slightly larger with extension superior to the left of the celiac and to the left adrenal gland, no new sites of disease.  1.6 cm left renal lesion has enlarged from 1.3 cm 2. Left renal cyst with a mural nodular component on MRI 02/20/2013. Indeterminate. 3. Acute appendicitis  status post laparoscopic appendectomy 01/26/2013. 4. Port-A-Cath placement 03/21/2013, removed 08/26/2013. 5. Right mid to low back sebaceous cyst.  6. History of Rosacea rash over the face. 7. History of tobacco use  8. Screening chest CT 08/05/2013 revealed masslike and nodular opacities in the medial segment of the right middle lobe and in the inferior aspect of the lingula, followup chest CT 10/31/2013 revealed improvement 9. Cystic-appearing lesion in the body of the pancreas adjacent to the resection line noted on the CT 08/05/2013 and 10/31/2013-discussed with Dr. Barry Dienes, felt to represent postoperative change 10. "Pancreatitis "-managed by primary M.D. 11. Frequent bowel movements-likely secondary to pancreas insufficiency, he continues Creon and Lomotil   Disposition: Jon Pena has recurrent pancreas cancer.  He stated again today that he will not agree to systemic chemotherapy.  He continues alternative medications.  I recommended he follow-up with Dr. Laural Golden for evaluation and management of the diarrhea.  I gave him a prescription for oxycodone to use as needed for pain.  He will contact us if this does not relieve the pain.  Jon Pena will return for an office visit in approximately 1 month.  15 minutes were spent with the patient today.  The majority of the time was used for counseling and coordination of care.  Betsy Coder, MD  07/23/2017  1:03 PM

## 2017-07-23 NOTE — Telephone Encounter (Signed)
Appointment scheduled printed AVS/Calendar

## 2017-07-31 ENCOUNTER — Telehealth (INDEPENDENT_AMBULATORY_CARE_PROVIDER_SITE_OTHER): Payer: Self-pay | Admitting: *Deleted

## 2017-07-31 NOTE — Telephone Encounter (Signed)
I am going to show Dr. Laural Golden stool diary and ask for his recommendations.

## 2017-07-31 NOTE — Telephone Encounter (Signed)
Patient called left message stating he wanted to know what else he needs to do, patient states his weight is 150 and he is still loosing weight and it is not changing. Please advise [phone]

## 2017-08-02 ENCOUNTER — Telehealth (INDEPENDENT_AMBULATORY_CARE_PROVIDER_SITE_OTHER): Payer: Self-pay | Admitting: Internal Medicine

## 2017-08-02 ENCOUNTER — Other Ambulatory Visit (INDEPENDENT_AMBULATORY_CARE_PROVIDER_SITE_OTHER): Payer: Self-pay | Admitting: Internal Medicine

## 2017-08-02 MED ORDER — SUCRALFATE 1 G PO TABS
1.0000 g | ORAL_TABLET | Freq: Three times a day (TID) | ORAL | 1 refills | Status: DC
Start: 2017-08-02 — End: 2017-10-02

## 2017-08-02 NOTE — Progress Notes (Unsigned)
Stool diary reviewed with patient. Still having frequent loose stools and excessive flatus. He has lost 5 pounds since he was last seen in the office. He is also waking up in the middle of night with epigastric burning pain.   Prescription sent for sucralfate 1 g 4 times daily but he will start 2 g nightly to begin with He will increase pancreatic enzyme supplement to 3-4 capsules with each meal depending on the type of foods he is eating. We also consider antibiotic for possible small intestinal bacterial overgrowth. Patient will keep stool diary and call with progress report in 2 weeks.

## 2017-08-02 NOTE — Telephone Encounter (Signed)
Increase to 4 Creon with meals and 1 with snack. PR in a couple of weeks.

## 2017-08-03 ENCOUNTER — Other Ambulatory Visit: Payer: Self-pay | Admitting: Oncology

## 2017-08-03 ENCOUNTER — Telehealth: Payer: Self-pay | Admitting: *Deleted

## 2017-08-03 DIAGNOSIS — C252 Malignant neoplasm of tail of pancreas: Secondary | ICD-10-CM

## 2017-08-03 NOTE — Telephone Encounter (Signed)
Patient here to pick up pain medicine refill.  Expressed "diarrhea however it has improved from twenty stools daily to five now three stools daily.  I weigh only 152 lbs." B.R.A.T diet suggested   While here to pick up prescription. "I can't do that,the white rice, white bread breaks down to sugar affecting my pancreas.   I've never met or worked with Corporate treasurer but I'd like to if there's any help."    Routing for review tomorrow by provider for any orders.

## 2017-08-03 NOTE — Telephone Encounter (Signed)
Spoke with pt regarding refill. Informed pt that prescription paper for Hydrocodone is available for pickup. Voiced undersatnding.

## 2017-08-05 NOTE — Telephone Encounter (Signed)
He should f/u with GI for diarrhea.  They are seeing him for this.

## 2017-08-06 ENCOUNTER — Telehealth (INDEPENDENT_AMBULATORY_CARE_PROVIDER_SITE_OTHER): Payer: Self-pay | Admitting: *Deleted

## 2017-08-06 NOTE — Telephone Encounter (Signed)
Patient called to report in that he is still losing weight and having difficulty strength wise walking patient said his weight is 147. patient states he is eating a lot and when he goes to the bathroom it's almost like he loses half a pound. Please advise [phone].   Patient wants to know what else does he need to do, patient requested for this message to go to [facility] and Dr Laural Golden.

## 2017-08-06 NOTE — Telephone Encounter (Signed)
Per Dr.Rehman - may call in Xifaxan 500 mg three times daily to see if this will help with the bloating or diarrhea. Patient was called and made aware.

## 2017-08-06 NOTE — Telephone Encounter (Signed)
Be treated with Xifaxan for possible SIBO.

## 2017-08-06 NOTE — Telephone Encounter (Signed)
This was called to [facility] in [place].

## 2017-08-06 NOTE — Telephone Encounter (Signed)
Dr Laural Golden is going to talk with patient

## 2017-08-07 ENCOUNTER — Telehealth (INDEPENDENT_AMBULATORY_CARE_PROVIDER_SITE_OTHER): Payer: Self-pay | Admitting: *Deleted

## 2017-08-07 ENCOUNTER — Telehealth (INDEPENDENT_AMBULATORY_CARE_PROVIDER_SITE_OTHER): Payer: Self-pay | Admitting: Internal Medicine

## 2017-08-07 DIAGNOSIS — C252 Malignant neoplasm of tail of pancreas: Secondary | ICD-10-CM

## 2017-08-07 MED ORDER — PANCRELIPASE (LIP-PROT-AMYL) 36000-114000 UNITS PO CPEP
ORAL_CAPSULE | ORAL | 3 refills | Status: DC
Start: 2017-08-07 — End: 2017-10-23

## 2017-08-07 NOTE — Telephone Encounter (Signed)
Rx sent to his pharmacy

## 2017-08-07 NOTE — Telephone Encounter (Signed)
[facility] called left message stating the patient will need a new prescription of creon 36000 units 480 capsules 16 tablets a day 1 month supply. Patient is taking 3-4 capsules with meals when it was 3 capsules with meals and 1 with snacks and he has increased his snacks since he is losing weight. The insurance will need a new prescription. Any questions please call [phone].

## 2017-08-07 NOTE — Telephone Encounter (Signed)
I have sent Rx

## 2017-08-18 ENCOUNTER — Ambulatory Visit (HOSPITAL_COMMUNITY)
Admission: EM | Admit: 2017-08-18 | Discharge: 2017-08-18 | Disposition: A | Discharge status: Other Acute Inpatient Hospital | Payer: PPO

## 2017-08-20 DIAGNOSIS — Z23 Encounter for immunization: Secondary | ICD-10-CM | POA: Diagnosis not present

## 2017-08-22 ENCOUNTER — Inpatient Hospital Stay: Payer: PPO | Attending: Oncology | Admitting: Nurse Practitioner

## 2017-08-22 ENCOUNTER — Telehealth: Payer: Self-pay | Admitting: Nurse Practitioner

## 2017-08-22 ENCOUNTER — Encounter: Payer: Self-pay | Admitting: Nurse Practitioner

## 2017-08-22 VITALS — BP 106/66 | HR 53 | Temp 97.7°F | Resp 18 | Ht 71.0 in | Wt 152.1 lb

## 2017-08-22 DIAGNOSIS — R197 Diarrhea, unspecified: Secondary | ICD-10-CM | POA: Diagnosis not present

## 2017-08-22 DIAGNOSIS — Z9081 Acquired absence of spleen: Secondary | ICD-10-CM | POA: Diagnosis not present

## 2017-08-22 DIAGNOSIS — C252 Malignant neoplasm of tail of pancreas: Secondary | ICD-10-CM | POA: Diagnosis not present

## 2017-08-22 DIAGNOSIS — Z90411 Acquired partial absence of pancreas: Secondary | ICD-10-CM

## 2017-08-22 NOTE — Progress Notes (Addendum)
[facility] OFFICE PROGRESS NOTE   Diagnosis: Pancreas cancer  INTERVAL HISTORY:   Jon Pena returns as scheduled.  He notes improvement in the diarrhea.  He is typically having a bowel movement twice a day.  He had some left-sided abdominal pain this morning.  This was relieved with a bowel movement.  He has periodic "gas pain".  He reports a good appetite.  He is concerned about loss of muscle mass.  He continues alternative treatment of the pancreas cancer.  Legs feel weak.  He began B12 injections recently.  He notes some improvement in the leg weakness.  Objective:  Vital signs in last 24 hours:  Blood pressure 106/66, pulse (!) 53, temperature 97.7 F (36.5 C), temperature source Oral, resp. rate 18, height 5\' 11"  (1.803 m), weight 152 lb 1.6 oz (69 kg), SpO2 99 %.    HEENT: Neck without mass. Lymphatics: No palpable cervical, supraclavicular or axillary lymph nodes. Resp: Lungs clear bilaterally. Cardio: Regular rate and rhythm. GI: Abdomen soft and nontender.  No hepatomegaly.  No mass.  No apparent ascites. Vascular: No leg edema.  Calves soft and nontender.  Skin: Skin in general has a dry appearance.   Lab Results:  Lab Results  Component Value Date   WBC 8.5 06/08/2017   HGB 14.9 06/08/2017   HCT 43.4 06/08/2017   MCV 92.9 06/08/2017   PLT 246 06/08/2017   NEUTROABS 5.1 06/08/2017    Imaging:  No results found.  Medications: I have reviewed the patient's current medications.  Assessment/Plan: 1. 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  Adjuvant gemcitabine 03/27/2013, 04/03/2013 and 04/10/2013.   Initiation of radiation and capecitabine 04/28/2013. Discontinued 05/22/2013 secondary to nausea.   Adjuvant gemcitabine resumed 06/17/2013. Last gemcitabine given 07/22/2013.  PET scan 01/22/2017-hypermetabolic soft tissue adjacent to the resection site,  hypermetabolic adjacent satellite lesion, 2 areas of hypermetabolism in the liver  EUS biopsy of pancreas bed mass on 02/01/2017-adenocarcinoma  CT abdomen/pelvis 06/18/2017-partially calcified soft tissue mass to the left of the SMA is slightly larger with extension superior to the left of the celiac and to the left adrenal gland, no new sites of disease. 1.6 cm left renal lesion has enlarged from 1.3 cm 2. Left renal cyst with a mural nodular component on MRI 02/20/2013. Indeterminate. 3. Acute appendicitis status post laparoscopic appendectomy 01/26/2013. 4. Port-A-Cath placement 03/21/2013, removed 08/26/2013. 5. Right mid to low back sebaceous cyst.  6. History of Rosacea rash over the face. 7. History of tobacco use  8. Screening chest CT 08/05/2013 revealed masslike and nodular opacities in the medial segment of the right middle lobe and in the inferior aspect of the lingula, followup chest CT 10/31/2013 revealed improvement 9. Cystic-appearing lesion in the body of the pancreas adjacent to the resection line noted on the CT 08/05/2013 and 10/31/2013-discussed with Dr. Barry Dienes, felt to represent postoperative change 10. "Pancreatitis "-managed by primary M.D. 11. Frequent bowel movements-likely secondary to pancreas insufficiency, he continues Creon and Lomotil   Disposition: [name] appears unchanged.  He declines systemic chemotherapy.  He plans to continue alternative treatment.  The diarrhea overall is better.  He continues to follow-up with Dr. Laural Golden.  He would like to meet with the [place] dietitian to discuss weight gain.  We made the referral.  He agrees to return for a follow-up visit in 3 months.  We will check a CA-19-9 at that time.  He  will contact the office in the interim with any problems.  Patient seen with Dr. Benay Spice.    Ned Card ANP/GNP-BC   08/22/2017  11:28 AM  This was a shared visit with Ned Card.  Jon Pena appears unchanged.  He  does not wish to consider systemic chemotherapy.  He is followed by Dr. Laural Golden for diarrhea.  He will return for an office visit in 3 months.  We are available to see him in the interim as needed.  Jon Manson, MD

## 2017-08-22 NOTE — Telephone Encounter (Signed)
Scheduled appt per 3/6 los - sent reminder letter In the mail - patient is aware of appt date and time.

## 2017-08-24 DIAGNOSIS — E538 Deficiency of other specified B group vitamins: Secondary | ICD-10-CM | POA: Diagnosis not present

## 2017-09-07 ENCOUNTER — Other Ambulatory Visit: Payer: Self-pay | Admitting: Oncology

## 2017-09-07 DIAGNOSIS — C252 Malignant neoplasm of tail of pancreas: Secondary | ICD-10-CM

## 2017-09-13 ENCOUNTER — Inpatient Hospital Stay: Payer: PPO | Admitting: Nutrition

## 2017-09-13 NOTE — Progress Notes (Signed)
67 year old male diagnoses with Pancreas cancer. He is a patient of Dr. Benay Spice.  PMH includes HLD, HOH, and GERD.  Medications include Lomotil, Pepcid, Creon, Zofran, Protoniz, and Carafate.  Labs were reviewed.  Height: 5'11" Weight: 152.1 pounds. UBW: 200 pounds in January of 2017. BMI: 21.21.  Patient declines chemotherapy and is choosing alternative treatments. Reports good appetite but wants to gain weight. Reports diarrhea for ~ 6 months. Appears to be taking Creon appropriately. States he has run out of Lomotil and is requesting a refill. Patient also takes Probiotics. Dietary recall reveals patient eats a variety of foods but avoids foods that cause him distress.  Nutrition Diagnosis: Food and Nutrition Related Knowledge Deficit related to Pancreas cancer as evidenced by about 50 lb weight loss over 2 years from UBW.  Intervention: Patient educated on strategies to add calories to meals. Reviewed foods to include to improve diarrhea. Recommended he continue Creon, Lomotil and Probiotics. Questions answered and teach back method used.  Monitoring, Evaluation, Goals. Patient will increase calories and protein to promote weight and muscle gain.  No follow up scheduled.

## 2017-09-24 ENCOUNTER — Telehealth (INDEPENDENT_AMBULATORY_CARE_PROVIDER_SITE_OTHER): Payer: Self-pay | Admitting: Internal Medicine

## 2017-09-24 DIAGNOSIS — E538 Deficiency of other specified B group vitamins: Secondary | ICD-10-CM | POA: Diagnosis not present

## 2017-09-24 DIAGNOSIS — C259 Malignant neoplasm of pancreas, unspecified: Secondary | ICD-10-CM | POA: Diagnosis not present

## 2017-09-24 DIAGNOSIS — Z6821 Body mass index (BMI) 21.0-21.9, adult: Secondary | ICD-10-CM | POA: Diagnosis not present

## 2017-09-24 NOTE — Telephone Encounter (Signed)
err

## 2017-09-24 NOTE — Telephone Encounter (Signed)
Patient called stated he is still having diarrhea - has lost down to 149 lbs - nothing seems to be working - please call ph# [phone]

## 2017-10-01 NOTE — Telephone Encounter (Signed)
Dr. Laural Golden, this is gentleman with pancreatic cancer and diarrhea. Could u please talk to him. Thanks.  (diarrhea

## 2017-10-02 ENCOUNTER — Telehealth: Payer: Self-pay

## 2017-10-02 ENCOUNTER — Other Ambulatory Visit (INDEPENDENT_AMBULATORY_CARE_PROVIDER_SITE_OTHER): Payer: Self-pay | Admitting: Internal Medicine

## 2017-10-02 ENCOUNTER — Encounter: Payer: Self-pay | Admitting: Oncology

## 2017-10-02 ENCOUNTER — Encounter: Payer: Self-pay | Admitting: Nurse Practitioner

## 2017-10-02 DIAGNOSIS — C252 Malignant neoplasm of tail of pancreas: Secondary | ICD-10-CM

## 2017-10-02 NOTE — Telephone Encounter (Signed)
Patient left message routed to [facility].

## 2017-10-02 NOTE — Telephone Encounter (Signed)
Patient's call returned. I told him that I was sorry that my office did not return his call last week while I was out of town. Ms. [name] should  have addressed this call. Jon Pena remains with diarrhea.  He had 10 stools yesterday and day before.  His average has been 5-6 stools.  Today he only had one stool.  He feels hydrocodone that he has used for pain is helped his diarrhea. She is advised to discontinue sucralfate and Lomotil as these medications have not helped. Continue pancreatic enzyme supplement.  He is currently receiving 720 0 units/kg/day which should be enough. Patient advised to take hydrocodone on schedule either 2 or 3 times a day. He can take Gas-X up to 3 tablets a day.  He is presently taking 1 a day. Patient advised to send me pictures of his next 6 stools. Not gotten report on GI pathogen panel.  He says he did take a sample to the lab. He was unsure about the bill and therefore did not paid in has been reported to collection agency.  Unless he has consistent improvement with hydrocodone would consider 2-week trial with Xifaxan prior to further stool studies and may switch him to codeine and consider Sandostatin.

## 2017-10-02 NOTE — Telephone Encounter (Signed)
Attempted to call pt in response to MyChart message and voicemail left regarding the content of his Dr. Note from Dr. Benay Spice, refills needed, and difficulty with communication from Gastroenterology office. No answer on home or cell phone. LWM on cell for pt to return call to our clinic.

## 2017-10-03 MED ORDER — HYDROCODONE-ACETAMINOPHEN 5-325 MG PO TABS: ORAL_TABLET | ORAL | 0 refills | Status: DC

## 2017-10-03 NOTE — Telephone Encounter (Signed)
Spoke with pt. Pt states he spoke with Dr. Laural Golden who was out of the office for 2 weeks. Pt states "I wasn't implying anything by my MyChart message, I just wanted to know what was in the note". Pt informed that he can obtain provider's note from medical records if unable to view on MyChart. Pt voiced gratitude for Dr. Benay Spice. Also requests hydrocodone refill, ok per MD.

## 2017-10-04 NOTE — Telephone Encounter (Signed)
Dr.Rehman talked with the patient.

## 2017-10-06 ENCOUNTER — Other Ambulatory Visit (INDEPENDENT_AMBULATORY_CARE_PROVIDER_SITE_OTHER): Payer: Self-pay | Admitting: Internal Medicine

## 2017-10-06 MED ORDER — CODEINE SULFATE 30 MG PO TABS: 30.0000 mg | ORAL_TABLET | Freq: Four times a day (QID) | ORAL | 0 refills | Status: DC

## 2017-10-06 MED ORDER — METRONIDAZOLE 250 MG PO TABS: 250.0000 mg | ORAL_TABLET | Freq: Three times a day (TID) | ORAL | 0 refills | Status: DC

## 2017-10-06 NOTE — Progress Notes (Signed)
Patient remains with diarrhea. He sent 6 pictures of his stool.  First 1 had soft semi-formed stool.  Rest of the bowel movements are loose watery. We will treat patient with metronidazole 250 mg p.o. 3 times daily for 2 weeks for possible SIBO. Patient advised not to take hydrocodone for diarrhea and will start him on codeine 30 mg p.o. 6 hours.  Scription sent for 120 doses. Progress report in 5 days.

## 2017-10-08 ENCOUNTER — Inpatient Hospital Stay: Payer: PPO

## 2017-10-08 ENCOUNTER — Inpatient Hospital Stay: Payer: PPO | Attending: Oncology | Admitting: Medical

## 2017-10-08 ENCOUNTER — Telehealth: Payer: Self-pay

## 2017-10-08 VITALS — BP 109/70 | HR 83 | Temp 98.6°F | Resp 17 | Ht 71.0 in | Wt 147.5 lb

## 2017-10-08 DIAGNOSIS — C259 Malignant neoplasm of pancreas, unspecified: Secondary | ICD-10-CM

## 2017-10-08 DIAGNOSIS — N281 Cyst of kidney, acquired: Secondary | ICD-10-CM | POA: Insufficient documentation

## 2017-10-08 DIAGNOSIS — Z87891 Personal history of nicotine dependence: Secondary | ICD-10-CM

## 2017-10-08 DIAGNOSIS — E785 Hyperlipidemia, unspecified: Secondary | ICD-10-CM | POA: Diagnosis not present

## 2017-10-08 DIAGNOSIS — C252 Malignant neoplasm of tail of pancreas: Secondary | ICD-10-CM | POA: Diagnosis not present

## 2017-10-08 DIAGNOSIS — Z9081 Acquired absence of spleen: Secondary | ICD-10-CM | POA: Insufficient documentation

## 2017-10-08 DIAGNOSIS — R1012 Left upper quadrant pain: Secondary | ICD-10-CM | POA: Insufficient documentation

## 2017-10-08 DIAGNOSIS — K219 Gastro-esophageal reflux disease without esophagitis: Secondary | ICD-10-CM | POA: Insufficient documentation

## 2017-10-08 DIAGNOSIS — Z803 Family history of malignant neoplasm of breast: Secondary | ICD-10-CM | POA: Diagnosis not present

## 2017-10-08 DIAGNOSIS — I700 Atherosclerosis of aorta: Secondary | ICD-10-CM | POA: Diagnosis not present

## 2017-10-08 DIAGNOSIS — R634 Abnormal weight loss: Secondary | ICD-10-CM | POA: Insufficient documentation

## 2017-10-08 LAB — CBC WITH DIFFERENTIAL (CANCER CENTER ONLY)
BASOS ABS: 0.1 10*3/uL (ref 0.0–0.1)
Basophils Relative: 1 %
Eosinophils Absolute: 0.3 10*3/uL (ref 0.0–0.5)
Eosinophils Relative: 4 %
HCT: 42 % (ref 38.4–49.9)
HEMOGLOBIN: 14.2 g/dL (ref 13.0–17.1)
LYMPHS PCT: 27 %
Lymphs Abs: 2.3 10*3/uL (ref 0.9–3.3)
MCH: 32.1 pg (ref 27.2–33.4)
MCHC: 33.7 g/dL (ref 32.0–36.0)
MCV: 95.2 fL (ref 79.3–98.0)
Monocytes Absolute: 0.9 10*3/uL (ref 0.1–0.9)
Monocytes Relative: 11 %
NEUTROS ABS: 4.7 10*3/uL (ref 1.5–6.5)
NEUTROS PCT: 57 %
PLATELETS: 280 10*3/uL (ref 140–400)
RBC: 4.41 MIL/uL (ref 4.20–5.82)
RDW: 13.1 % (ref 11.0–14.6)
WBC: 8.3 10*3/uL (ref 4.0–10.3)

## 2017-10-08 LAB — CMP (CANCER CENTER ONLY)
ALK PHOS: 89 U/L (ref 40–150)
ALT: 17 U/L (ref 0–55)
AST: 15 U/L (ref 5–34)
Albumin: 3.9 g/dL (ref 3.5–5.0)
Anion gap: 9 (ref 3–11)
BUN: 25 mg/dL (ref 7–26)
CHLORIDE: 105 mmol/L (ref 98–109)
CO2: 24 mmol/L (ref 22–29)
CREATININE: 1.07 mg/dL (ref 0.70–1.30)
Calcium: 9.4 mg/dL (ref 8.4–10.4)
GFR, Est AFR Am: >60 mL/min (ref >60)
Glucose, Bld: 145 mg/dL — ABNORMAL HIGH (ref 70–140)
Potassium: 4.3 mmol/L (ref 3.5–5.1)
Sodium: 138 mmol/L (ref 136–145)
Total Bilirubin: 0.5 mg/dL (ref 0.2–1.2)
Total Protein: 6.8 g/dL (ref 6.4–8.3)

## 2017-10-08 LAB — MAGNESIUM: Magnesium: 1.9 mg/dL (ref 1.7–2.4)

## 2017-10-08 NOTE — Telephone Encounter (Signed)
Call from pt c/o "extreme pain at 8/10 pain". Pt states "'i've had diarrhea 3x today since 0730 and 7x yday". Pt to be seen in [facility] per Sandi Mealy. Pt voiced understanding.

## 2017-10-08 NOTE — Patient Instructions (Signed)

## 2017-10-08 NOTE — Progress Notes (Signed)
[facility] Progress Note   Jon Pena [id number] Jun 14, 1951 67 y.o.  Jon Pena is managed by Dr. Dominica Severin B. Sherrill  Actively treated with chemotherapy: no   Assessment: Plan:    Malignant neoplasm of pancreas, unspecified location of malignancy ([place]) - Plan: CT Abdomen Pelvis W Contrast  Left upper quadrant pain - Plan: CT Abdomen Pelvis W Contrast   Metastatic malignant neoplasm of the pancreas with left upper quadrant abdominal pain: A chemistry panel, CBC and CA-19-9 were ordered today.  The patient was seen with Dr. Dominica Severin B. Sherrill.  A CT of the abdomen and pelvis was ordered for tomorrow.  The patient will return to see Dr. Benay Spice on 10/09/2017 for follow-up of these studies.  Please see After Visit Summary for patient specific instructions.  Future Appointments  Date Time Provider [place]  11/22/2017  9:30 AM [facility] None  11/22/2017 10:00 AM Ladell Pier, MD [facility] None    Orders Placed This Encounter  Procedures  . CT Abdomen Pelvis W Contrast       Subjective:   Patient ID:  Jon Pena is a 67 y.o. (DOB 1950/11/07) male.  Chief Complaint:  Chief Complaint  Patient presents with  . Pain    HPI Jon Pena is a 66 year old male with a history of a metastatic pancreatic cancer.  He is followed by Dr. Dominica Severin B. Sherrill.  He was last seen on 08/22/2017.  His history of pancreatic cancer dates to his diagnosis in September 2014.  He was last treated with adjuvant gemcitabine on 07/22/2013.  He has opted to pursue alternative therapy since that time. He presents to the office today with a report of progressive weight loss sharp left upper quadrant pain.  He reports having diarrhea for the last 7 months and states that this began after having a PET scan completed.  He is recently been seen by GI and was gone on Flagyl.  He has stopped Flagyl as he was having rectal irritation with his bowel  movements.  He denies nausea or vomiting.  He reports that his left upper quadrant pain was 8/10 this morning and is 5/10 now.  He is wanting to discuss whether he should have a CT scan now or if he would benefit from an endoscopy and colonoscopy.  Medications: I have reviewed the patient's current medications.  Allergies: No Known Allergies  Past Medical History:  Diagnosis Date  . GERD (gastroesophageal reflux disease)   . HOH (hard of hearing)   . Hyperlipidemia   . Pancreatic adenocarcinoma ([place]) 02/27/13   Stage IIB (T3 N1) status post distal pancreatectomy/splenectomy, status post chemotherapy and XRT  . Pancreatitis     Past Surgical History:  Procedure Laterality Date  . APPENDECTOMY    . COLONOSCOPY    . EUS N/A 02/20/2013   Procedure: UPPER ENDOSCOPIC ULTRASOUND (EUS) LINEAR;  Surgeon: Milus Banister, MD;  Location: [facility];  Service: Endoscopy;  Laterality: N/A;  . EUS N/A 02/01/2017   Procedure: UPPER ENDOSCOPIC ULTRASOUND (EUS) LINEAR;  Surgeon: Milus Banister, MD;  Location: [facility];  Service: Endoscopy;  Laterality: N/A;  . LAPAROSCOPIC APPENDECTOMY N/A 01/26/2013   Procedure: APPENDECTOMY LAPAROSCOPIC;  Surgeon: Zenovia Jarred, MD;  Location: [facility];  Service: General;  Laterality: N/A;  . LAPAROSCOPIC SPLENECTOMY N/A 02/27/2013   Procedure: LAPAROSCOPIC SPLENECTOMY;  Surgeon: Stark Klein, MD;  Location: [facility];  Service: General;  Laterality: N/A;  . PANCREATECTOMY  N/A 02/27/2013   Procedure: LAPAROSCOPIC PANCREATECTOMY;  Surgeon: Stark Klein, MD;  Location: [facility];  Service: General;  Laterality: N/A;  . POLYPECTOMY    . PORT-A-CATH REMOVAL Left 08/26/2013   Procedure: REMOVAL PORT-A-CATH;  Surgeon: Stark Klein, MD;  Location: [facility];  Service: General;  Laterality: Left;  . PORTACATH PLACEMENT N/A 03/21/2013   Procedure: INSERTION PORT-A-CATH;  Surgeon: Stark Klein, MD;  Location: [facility];  Service: General;  Laterality:  N/A;  . TONSILLECTOMY      Family History  Problem Relation Age of Onset  . Pancreatitis Father   . Breast cancer Paternal Aunt   . Colon cancer Neg Hx   . Rectal cancer Neg Hx   . Stomach cancer Neg Hx     Social History   Socioeconomic History  . Marital status: Divorced    Spouse name: Not on file  . Number of children: 0  . Years of education: Not on file  . Highest education level: Not on file  Occupational History  . Occupation: Retired    Fish farm manager: Research scientist (medical)  Social Needs  . Financial resource strain: Not on file  . Food insecurity:    Worry: Not on file    Inability: Not on file  . Transportation needs:    Medical: Not on file    Non-medical: Not on file  Tobacco Use  . Smoking status: Former Smoker    Packs/day: 1.00    Years: 20.00    Pack years: 20.00    Types: Cigarettes    Last attempt to quit: 02/11/2013    Years since quitting: 4.6  . Smokeless tobacco: Never Used  . Tobacco comment: hasn't smoked in past 14 days  Substance and Sexual Activity  . Alcohol use: No    Comment: occasional-not since surgery 9/14  . Drug use: No  . Sexual activity: Yes  Lifestyle  . Physical activity:    Days per week: Not on file    Minutes per session: Not on file  . Stress: Not on file  Relationships  . Social connections:    Talks on phone: Not on file    Gets together: Not on file    Attends religious service: Not on file    Active member of club or organization: Not on file    Attends meetings of clubs or organizations: Not on file    Relationship status: Not on file  . Intimate partner violence:    Fear of current or ex partner: Not on file    Emotionally abused: Not on file    Physically abused: Not on file    Forced sexual activity: Not on file  Other Topics Concern  . Not on file  Social History Narrative   Divorced, no children   Lives and cares for his 75 year old mother (has dementia)   Works @ [employer]   Enjoys golf, working out at gym   Caffeine use: Drinks 2 cups coffee per day, no soda   Right handed    Past Medical History, Surgical history, Social history, and Family history were reviewed and updated as appropriate.   Please see review of systems for further details on the patient's review from today.   Review of Systems:  Review of Systems  Constitutional: Negative for appetite change, chills, diaphoresis and fever.  Respiratory: Negative for cough, chest tightness and shortness of breath.   Cardiovascular:  Negative for chest pain, palpitations and leg swelling.  Gastrointestinal: Positive for abdominal pain and diarrhea. Negative for abdominal distention, blood in stool, constipation, nausea and vomiting.  Genitourinary: Negative for decreased urine volume and difficulty urinating.  Neurological: Negative for weakness.    Objective:   Physical Exam:  BP 109/70 (BP Location: Right Arm, Patient Position: Sitting)   Pulse 83   Temp 98.6 F (37 C) (Oral)   Resp 17   Ht 5\' 11"  (1.803 m)   Wt 147 lb 8 oz (66.9 kg)   SpO2 97%   BMI 20.57 kg/m  ECOG: 0  Physical Exam  Constitutional: No distress.  HENT:  Head: Normocephalic and atraumatic.  Mouth/Throat: Oropharynx is clear and moist.  Cardiovascular: Normal rate, regular rhythm and normal heart sounds. Exam reveals no gallop and no friction rub.  No murmur heard. Pulmonary/Chest: Effort normal and breath sounds normal. No respiratory distress. He has no wheezes. He has no rales.  Abdominal: Soft. Bowel sounds are normal. He exhibits no distension and no mass. There is tenderness (Left upper quadrant tenderness). There is no rebound and no guarding.  Musculoskeletal: He exhibits no edema.  Neurological: He is alert.  Skin: Skin is warm and dry. He is not diaphoretic.    Lab Review:     Component Value Date/Time   NA 138 10/08/2017 1308   NA 138 06/08/2017 1306   K 4.3 10/08/2017  1308   K 4.4 06/08/2017 1306   CL 105 10/08/2017 1308   CO2 24 10/08/2017 1308   CO2 25 06/08/2017 1306   GLUCOSE 145 (H) 10/08/2017 1308   GLUCOSE 96 06/08/2017 1306   BUN 25 10/08/2017 1308   BUN 19.2 06/08/2017 1306   CREATININE 1.07 10/08/2017 1308   CREATININE 0.9 06/08/2017 1306   CALCIUM 9.4 10/08/2017 1308   CALCIUM 9.3 06/08/2017 1306   PROT 6.8 10/08/2017 1308   PROT 7.0 06/08/2017 1306   ALBUMIN 3.9 10/08/2017 1308   ALBUMIN 4.1 06/08/2017 1306   AST 15 10/08/2017 1308   AST 15 06/08/2017 1306   ALT 17 10/08/2017 1308   ALT 13 06/08/2017 1306   ALKPHOS 89 10/08/2017 1308   ALKPHOS 73 06/08/2017 1306   BILITOT 0.5 10/08/2017 1308   BILITOT 0.53 06/08/2017 1306   GFRNONAA >60 10/08/2017 1308   GFRAA >60 10/08/2017 1308       Component Value Date/Time   WBC 8.3 10/08/2017 1308   WBC 8.5 06/08/2017 1306   WBC 12.1 (H) 03/04/2013 1140   RBC 4.41 10/08/2017 1308   HGB 14.2 10/08/2017 1308   HGB 14.9 06/08/2017 1306   HCT 42.0 10/08/2017 1308   HCT 43.4 06/08/2017 1306   PLT 280 10/08/2017 1308   PLT 246 06/08/2017 1306   MCV 95.2 10/08/2017 1308   MCV 92.9 06/08/2017 1306   MCH 32.1 10/08/2017 1308   MCHC 33.7 10/08/2017 1308   RDW 13.1 10/08/2017 1308   RDW 14.3 06/08/2017 1306   LYMPHSABS 2.3 10/08/2017 1308   LYMPHSABS 2.2 06/08/2017 1306   MONOABS 0.9 10/08/2017 1308   MONOABS 0.8 06/08/2017 1306   EOSABS 0.3 10/08/2017 1308   EOSABS 0.3 06/08/2017 1306   BASOSABS 0.1 10/08/2017 1308   BASOSABS 0.1 06/08/2017 1306   -------------------------------  Imaging from last 24 hours (if applicable):  Radiology interpretation: No results found.      This patient was seen with Dr. Benay Spice with my treatment plan reviewed with him. He expressed agreement with my medical management  of this patient. This was a shared visit with Sandi Mealy.  Mr. Mccollum was interviewed and examined.  The pain is most likely secondary to progression of pancreas cancer.  He  agrees to a restaging CT to be completed on 10/09/2017.  He will return for an office visit and further discussion on 10/10/2017.  Julieanne Manson, MD

## 2017-10-09 ENCOUNTER — Ambulatory Visit (HOSPITAL_COMMUNITY)
Admission: RE | Admit: 2017-10-09 | Discharge: 2017-10-09 | Disposition: A | Discharge status: Home / Self Care | Payer: PPO | Source: Ambulatory Visit | Attending: Medical | Admitting: Medical

## 2017-10-09 ENCOUNTER — Telehealth: Payer: Self-pay | Admitting: Oncology

## 2017-10-09 ENCOUNTER — Encounter (HOSPITAL_COMMUNITY): Payer: Self-pay

## 2017-10-09 DIAGNOSIS — K769 Liver disease, unspecified: Secondary | ICD-10-CM | POA: Diagnosis not present

## 2017-10-09 DIAGNOSIS — R1012 Left upper quadrant pain: Secondary | ICD-10-CM | POA: Insufficient documentation

## 2017-10-09 DIAGNOSIS — C259 Malignant neoplasm of pancreas, unspecified: Secondary | ICD-10-CM | POA: Diagnosis not present

## 2017-10-09 DIAGNOSIS — C480 Malignant neoplasm of retroperitoneum: Secondary | ICD-10-CM | POA: Diagnosis not present

## 2017-10-09 DIAGNOSIS — I700 Atherosclerosis of aorta: Secondary | ICD-10-CM | POA: Insufficient documentation

## 2017-10-09 MED ORDER — IOHEXOL 300 MG/ML  SOLN
100.0000 mL | Freq: Once | INTRAMUSCULAR | Status: AC | PRN
  Administered 2017-10-09: 100 mL via INTRAVENOUS

## 2017-10-09 MED ORDER — IOPAMIDOL (ISOVUE-300) INJECTION 61%
30.0000 mL | Freq: Once | INTRAVENOUS | Status: DC | PRN
  Administered 2017-10-09: 30 mL via ORAL
  Filled 2017-10-09: qty 30

## 2017-10-09 MED ORDER — IOPAMIDOL (ISOVUE-300) INJECTION 61%
INTRAVENOUS | Status: AC
  Filled 2017-10-09: qty 30

## 2017-10-09 NOTE — Telephone Encounter (Signed)
Scheduled appt per 4/23 sch msg - spoke w/ pt regarding appts that were added.

## 2017-10-10 ENCOUNTER — Telehealth: Payer: Self-pay | Admitting: Oncology

## 2017-10-10 ENCOUNTER — Inpatient Hospital Stay (HOSPITAL_BASED_OUTPATIENT_CLINIC_OR_DEPARTMENT_OTHER): Payer: PPO | Admitting: Oncology

## 2017-10-10 ENCOUNTER — Other Ambulatory Visit: Payer: Self-pay | Admitting: Oncology

## 2017-10-10 VITALS — BP 130/72 | HR 52 | Temp 98.9°F | Resp 18 | Ht 71.0 in | Wt 147.2 lb

## 2017-10-10 DIAGNOSIS — C252 Malignant neoplasm of tail of pancreas: Secondary | ICD-10-CM

## 2017-10-10 DIAGNOSIS — Z87891 Personal history of nicotine dependence: Secondary | ICD-10-CM | POA: Diagnosis not present

## 2017-10-10 DIAGNOSIS — Z803 Family history of malignant neoplasm of breast: Secondary | ICD-10-CM

## 2017-10-10 DIAGNOSIS — E785 Hyperlipidemia, unspecified: Secondary | ICD-10-CM | POA: Diagnosis not present

## 2017-10-10 DIAGNOSIS — R634 Abnormal weight loss: Secondary | ICD-10-CM | POA: Diagnosis not present

## 2017-10-10 DIAGNOSIS — N281 Cyst of kidney, acquired: Secondary | ICD-10-CM

## 2017-10-10 DIAGNOSIS — Z9081 Acquired absence of spleen: Secondary | ICD-10-CM

## 2017-10-10 DIAGNOSIS — K219 Gastro-esophageal reflux disease without esophagitis: Secondary | ICD-10-CM | POA: Diagnosis not present

## 2017-10-10 DIAGNOSIS — I700 Atherosclerosis of aorta: Secondary | ICD-10-CM | POA: Diagnosis not present

## 2017-10-10 DIAGNOSIS — C259 Malignant neoplasm of pancreas, unspecified: Secondary | ICD-10-CM

## 2017-10-10 DIAGNOSIS — R1012 Left upper quadrant pain: Secondary | ICD-10-CM

## 2017-10-10 NOTE — Telephone Encounter (Signed)
Scheduled appt per 4/24 los. °

## 2017-10-10 NOTE — Progress Notes (Signed)
[facility] OFFICE PROGRESS NOTE   Diagnosis: Pancreas cancer  INTERVAL HISTORY:   Jon Pena returns as scheduled.  He continues to have abdominal pain.  He has multiple bowel movements per day.  He reports having 8 bowel movements yesterday. He underwent a restaging CT of the abdomen/pelvis yesterday.  Objective:  Vital signs in last 24 hours:  Blood pressure 130/72, pulse (!) 52, temperature 98.9 F (37.2 C), temperature source Oral, resp. rate 18, height 5\' 11"  (1.803 m), weight 147 lb 3.2 oz (66.8 kg), SpO2 96 %.    Physical examination-not performed today Lab Results:  Lab Results  Component Value Date   WBC 8.3 10/08/2017   HGB 14.2 10/08/2017   HCT 42.0 10/08/2017   MCV 95.2 10/08/2017   PLT 280 10/08/2017   NEUTROABS 4.7 10/08/2017    CMP     Component Value Date/Time   NA 138 10/08/2017 1308   NA 138 06/08/2017 1306   K 4.3 10/08/2017 1308   K 4.4 06/08/2017 1306   CL 105 10/08/2017 1308   CO2 24 10/08/2017 1308   CO2 25 06/08/2017 1306   GLUCOSE 145 (H) 10/08/2017 1308   GLUCOSE 96 06/08/2017 1306   BUN 25 10/08/2017 1308   BUN 19.2 06/08/2017 1306   CREATININE 1.07 10/08/2017 1308   CREATININE 0.9 06/08/2017 1306   CALCIUM 9.4 10/08/2017 1308   CALCIUM 9.3 06/08/2017 1306   PROT 6.8 10/08/2017 1308   PROT 7.0 06/08/2017 1306   ALBUMIN 3.9 10/08/2017 1308   ALBUMIN 4.1 06/08/2017 1306   AST 15 10/08/2017 1308   AST 15 06/08/2017 1306   ALT 17 10/08/2017 1308   ALT 13 06/08/2017 1306   ALKPHOS 89 10/08/2017 1308   ALKPHOS 73 06/08/2017 1306   BILITOT 0.5 10/08/2017 1308   BILITOT 0.53 06/08/2017 1306   GFRNONAA >60 10/08/2017 1308   GFRAA >60 10/08/2017 1308     Imaging:  Ct Abdomen Pelvis W Contrast  Result Date: 10/09/2017 CLINICAL DATA:  History of pancreas cancer.  Restaging. EXAM: CT ABDOMEN AND PELVIS WITH CONTRAST TECHNIQUE: Multidetector CT imaging of the abdomen and pelvis was performed using the standard  protocol following bolus administration of intravenous contrast. CONTRAST:  158mL OMNIPAQUE IOHEXOL 300 MG/ML  SOLN COMPARISON:  06/18/2017 FINDINGS: Lower chest: There is a small left pleural effusion which is new from previous exam. Hepatobiliary: Within segment 3 there is a new hypodense lesion measuring 1.7 cm, image 16/7. No additional focal liver abnormalities. The gallbladder appears normal. No biliary dilatation. Pancreas: Status post distal pancreatectomy. Left retroperitoneal mass which partially encases the left adrenal gland and superior mesenteric artery and celiac artery measures 3.5 x 4.3 by 5.0 cm (volume = 39 cm^3), image 35/7 and image 42/11. Previously, this measured 3.6 by 3.3 by 3.8 cm (volume = 24 cm^3). Progressive narrowing at the portal venous confluence is noted compared with previous exam, image 37/11. Spleen: Status post splenectomy. Adrenals/Urinary Tract: Similar appearance of asymmetrically enlarged and enhancing left adrenal gland, image 30/7. Right adrenal gland appears normal. Right kidney cyst measures 1 cm, image 51/7. Enhancing lesion arising from the posterior cortex of the upper pole of the left kidney measures 1.7 cm and is unchanged from previous exam, image 32/7. No hydronephrosis identified. Urinary bladder appears normal. Stomach/Bowel: Stomach, small bowel loops and colon are nondistended. Vascular/Lymphatic: Aortic atherosclerosis. No aneurysm. Extensive tumor infiltration within the upper portion of the retroperitoneum with encasement of the celiac, SMA and aorta. No pelvic  or inguinal adenopathy identified. Reproductive: Prostate is unremarkable. Other: There is mild peritoneal nodularity concerning for carcinomatosis. This is more conspicuous than on the previous exam. Omental nodule within the left abdomen measures 7 mm, image 52/7. Previously 5 mm. New nodule adjacent to the upper pole of the left kidney measures 1.2 cm, image 29/7. Within the right posterior pelvis  there is a soft tissue nodule measuring 8 mm, image 98/7. New from previous exam. Musculoskeletal: No aggressive lytic or sclerotic bone lesions. IMPRESSION: 1. Interval progression of disease. Infiltrative tumor within the retroperitoneum has increased in size compared with previous exam. 2. Mild peritoneal carcinomatosis is more conspicuous on today's study. There is a new nodule identified within the right posterior pelvis. 3. New hypodense lesion within segment 3 is concerning for liver metastasis. 4. Stable left kidney enhancing lesion compatible with renal cell carcinoma. 5.  Aortic Atherosclerosis (ICD10-I70.0). Electronically Signed   By: Kerby Moors M.D.   On: 10/09/2017 12:42   CT images reviewed with Jon Pena Medications: I have reviewed the patient's current medications.   Assessment/Plan: 1. 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  Adjuvant gemcitabine 03/27/2013, 04/03/2013 and 04/10/2013.   Initiation of radiation and capecitabine 04/28/2013. Discontinued 05/22/2013 secondary to nausea.   Adjuvant gemcitabine resumed 06/17/2013. Last gemcitabine given 07/22/2013.  PET scan 01/22/2017-hypermetabolic soft tissue adjacent to the resection site, hypermetabolic adjacent satellite lesion, 2 areas of hypermetabolism in the liver  EUS biopsy of pancreas bed mass on 02/01/2017-adenocarcinoma  CT abdomen/pelvis 06/18/2017-partially calcified soft tissue mass to the left of the SMA is slightly larger with extension superior to the left of the celiac and to the left adrenal gland, no new sites of disease. 1.6 cm left renal lesion has enlarged from 1.3 cm  CT abdomen/pelvis 10/09/2017- increased size of infiltrative retroperitoneal tumor, more conspicuous peritoneal nodules, new segment 3 liver lesion, stable left renal mass 2. Left renal cyst with a mural nodular component on MRI 02/20/2013.  Indeterminate. 3. Acute appendicitis status post laparoscopic appendectomy 01/26/2013. 4. Port-A-Cath placement 03/21/2013, removed 08/26/2013. 5. Right mid to low back sebaceous cyst.  6. History of Rosacea rash over the face. 7. History of tobacco use  8. Screening chest CT 08/05/2013 revealed masslike and nodular opacities in the medial segment of the right middle lobe and in the inferior aspect of the lingula, followup chest CT 10/31/2013 revealed improvement 9. Cystic-appearing lesion in the body of the pancreas adjacent to the resection line noted on the CT 08/05/2013 and 10/31/2013-discussed with Dr. Barry Dienes, felt to represent postoperative change 10. "Pancreatitis "-managed by primary M.D. 11. Frequent bowel movements-likely secondary to pancreas insufficiency, he continues Creon and Lomotil    Disposition: Jon Pena has metastatic pancreas cancer.  I reviewed the CT images from 10/09/2017 with him.  I suspect the pain is secondary to progression of tumor in the retroperitoneum.  He will continue hydrocodone as needed for pain.  He has frequent bowel movements/diarrhea.  He is followed by Dr.Rehman.  Jon Pena would like to undergo  endoscopy/colonoscopy procedures.  We discussed treatment options for the pancreas cancer.  I recommend a trial of chemotherapy.  We discussed FOLFIRINOX and gemcitabine/Abraxane.  He will return for office visit and further discussion in 2 weeks.  I think it will be difficult for him to tolerate FOLFIRINOX with the pre-existing diarrhea area  25 minutes were spent with the patient today.  The majority of the time was  used for counseling and coordination of care.  Betsy Coder, MD  10/10/2017  12:38 PM

## 2017-10-11 ENCOUNTER — Telehealth (INDEPENDENT_AMBULATORY_CARE_PROVIDER_SITE_OTHER): Payer: Self-pay | Admitting: *Deleted

## 2017-10-11 ENCOUNTER — Other Ambulatory Visit (INDEPENDENT_AMBULATORY_CARE_PROVIDER_SITE_OTHER): Payer: Self-pay | Admitting: *Deleted

## 2017-10-11 ENCOUNTER — Encounter (INDEPENDENT_AMBULATORY_CARE_PROVIDER_SITE_OTHER): Payer: Self-pay

## 2017-10-11 DIAGNOSIS — R1013 Epigastric pain: Secondary | ICD-10-CM | POA: Insufficient documentation

## 2017-10-11 DIAGNOSIS — R197 Diarrhea, unspecified: Secondary | ICD-10-CM | POA: Insufficient documentation

## 2017-10-11 MED ORDER — PEG 3350-KCL-NA BICARB-NACL 420 G PO SOLR: 4000.0000 mL | Freq: Once | ORAL | 0 refills | Status: DC

## 2017-10-11 NOTE — Telephone Encounter (Signed)
Patient needs trilyte 

## 2017-10-15 ENCOUNTER — Encounter (HOSPITAL_COMMUNITY)
Admission: RE | Disposition: A | Discharge status: Home / Self Care | Payer: Self-pay | Source: Ambulatory Visit | Attending: Internal Medicine

## 2017-10-15 ENCOUNTER — Other Ambulatory Visit: Payer: Self-pay

## 2017-10-15 ENCOUNTER — Encounter (HOSPITAL_COMMUNITY): Payer: Self-pay | Admitting: Emergency Medicine

## 2017-10-15 ENCOUNTER — Ambulatory Visit (HOSPITAL_COMMUNITY)
Admission: RE | Admit: 2017-10-15 | Discharge: 2017-10-15 | Disposition: A | Discharge status: Home / Self Care | Payer: PPO | Source: Ambulatory Visit | Attending: Internal Medicine | Admitting: Internal Medicine

## 2017-10-15 DIAGNOSIS — K6389 Other specified diseases of intestine: Secondary | ICD-10-CM | POA: Insufficient documentation

## 2017-10-15 DIAGNOSIS — C259 Malignant neoplasm of pancreas, unspecified: Secondary | ICD-10-CM | POA: Diagnosis not present

## 2017-10-15 DIAGNOSIS — K228 Other specified diseases of esophagus: Secondary | ICD-10-CM | POA: Diagnosis not present

## 2017-10-15 DIAGNOSIS — Z7982 Long term (current) use of aspirin: Secondary | ICD-10-CM | POA: Insufficient documentation

## 2017-10-15 DIAGNOSIS — C786 Secondary malignant neoplasm of retroperitoneum and peritoneum: Secondary | ICD-10-CM | POA: Insufficient documentation

## 2017-10-15 DIAGNOSIS — R1013 Epigastric pain: Secondary | ICD-10-CM | POA: Insufficient documentation

## 2017-10-15 DIAGNOSIS — E785 Hyperlipidemia, unspecified: Secondary | ICD-10-CM | POA: Insufficient documentation

## 2017-10-15 DIAGNOSIS — H9190 Unspecified hearing loss, unspecified ear: Secondary | ICD-10-CM | POA: Diagnosis not present

## 2017-10-15 DIAGNOSIS — K648 Other hemorrhoids: Secondary | ICD-10-CM | POA: Insufficient documentation

## 2017-10-15 DIAGNOSIS — Z8507 Personal history of malignant neoplasm of pancreas: Secondary | ICD-10-CM | POA: Diagnosis not present

## 2017-10-15 DIAGNOSIS — Z8719 Personal history of other diseases of the digestive system: Secondary | ICD-10-CM | POA: Diagnosis not present

## 2017-10-15 DIAGNOSIS — R197 Diarrhea, unspecified: Secondary | ICD-10-CM

## 2017-10-15 DIAGNOSIS — K2970 Gastritis, unspecified, without bleeding: Secondary | ICD-10-CM | POA: Insufficient documentation

## 2017-10-15 DIAGNOSIS — Z803 Family history of malignant neoplasm of breast: Secondary | ICD-10-CM | POA: Insufficient documentation

## 2017-10-15 DIAGNOSIS — C252 Malignant neoplasm of tail of pancreas: Secondary | ICD-10-CM

## 2017-10-15 DIAGNOSIS — Z87891 Personal history of nicotine dependence: Secondary | ICD-10-CM | POA: Diagnosis not present

## 2017-10-15 DIAGNOSIS — R7303 Prediabetes: Secondary | ICD-10-CM | POA: Insufficient documentation

## 2017-10-15 DIAGNOSIS — K219 Gastro-esophageal reflux disease without esophagitis: Secondary | ICD-10-CM | POA: Insufficient documentation

## 2017-10-15 DIAGNOSIS — Z79899 Other long term (current) drug therapy: Secondary | ICD-10-CM | POA: Insufficient documentation

## 2017-10-15 DIAGNOSIS — K5730 Diverticulosis of large intestine without perforation or abscess without bleeding: Secondary | ICD-10-CM | POA: Insufficient documentation

## 2017-10-15 DIAGNOSIS — Z859 Personal history of malignant neoplasm, unspecified: Secondary | ICD-10-CM | POA: Diagnosis not present

## 2017-10-15 DIAGNOSIS — K449 Diaphragmatic hernia without obstruction or gangrene: Secondary | ICD-10-CM | POA: Insufficient documentation

## 2017-10-15 DIAGNOSIS — Z9081 Acquired absence of spleen: Secondary | ICD-10-CM | POA: Diagnosis not present

## 2017-10-15 DIAGNOSIS — Z8379 Family history of other diseases of the digestive system: Secondary | ICD-10-CM | POA: Insufficient documentation

## 2017-10-15 DIAGNOSIS — Q438 Other specified congenital malformations of intestine: Secondary | ICD-10-CM | POA: Insufficient documentation

## 2017-10-15 HISTORY — PX: COLONOSCOPY: SHX5424

## 2017-10-15 HISTORY — DX: Prediabetes: R73.03

## 2017-10-15 HISTORY — PX: BIOPSY: SHX5522

## 2017-10-15 HISTORY — PX: ESOPHAGOGASTRODUODENOSCOPY: SHX5428

## 2017-10-15 SURGERY — EGD (ESOPHAGOGASTRODUODENOSCOPY)
Anesthesia: Moderate Sedation

## 2017-10-15 MED ORDER — PROMETHAZINE HCL 25 MG/ML IJ SOLN
INTRAMUSCULAR | Status: DC | PRN
  Administered 2017-10-15: 12.5 mg via INTRAVENOUS

## 2017-10-15 MED ORDER — MEPERIDINE HCL 50 MG/ML IJ SOLN
INTRAMUSCULAR | Status: AC
  Filled 2017-10-15: qty 1

## 2017-10-15 MED ORDER — LIDOCAINE VISCOUS 2 % MT SOLN
OROMUCOSAL | Status: DC | PRN
  Administered 2017-10-15: 1 via OROMUCOSAL

## 2017-10-15 MED ORDER — PROMETHAZINE HCL 25 MG/ML IJ SOLN
INTRAMUSCULAR | Status: AC
  Filled 2017-10-15: qty 1

## 2017-10-15 MED ORDER — LIDOCAINE VISCOUS 2 % MT SOLN
OROMUCOSAL | Status: AC
  Filled 2017-10-15: qty 15

## 2017-10-15 MED ORDER — MEPERIDINE HCL 50 MG/ML IJ SOLN
INTRAMUSCULAR | Status: DC | PRN
  Administered 2017-10-15 (×2): 25 mg
  Administered 2017-10-15: 25 mg via INTRAVENOUS

## 2017-10-15 MED ORDER — SODIUM CHLORIDE 0.9 % IV SOLN
INTRAVENOUS | Status: DC
  Administered 2017-10-15: 11:00:00 via INTRAVENOUS

## 2017-10-15 MED ORDER — MIDAZOLAM HCL 5 MG/5ML IJ SOLN
INTRAMUSCULAR | Status: AC
  Filled 2017-10-15: qty 10

## 2017-10-15 MED ORDER — MIDAZOLAM HCL 5 MG/5ML IJ SOLN
INTRAMUSCULAR | Status: DC | PRN
  Administered 2017-10-15: 2 mg via INTRAVENOUS
  Administered 2017-10-15: 1 mg via INTRAVENOUS
  Administered 2017-10-15: 2 mg via INTRAVENOUS
  Administered 2017-10-15: 1 mg via INTRAVENOUS

## 2017-10-15 NOTE — Op Note (Signed)
[facility] Patient Name: Jon Pena Procedure Date: 10/15/2017 11:39 AM MRN: [id number] Date of Birth: 10/28/1950 Attending MD: Hildred Laser , MD CSN: [id number] Age: 67 Admit Type: Outpatient Procedure:                Colonoscopy Indications:              Clinically significant diarrhea of unexplained                            origin, Personal history of pancreatic                            adenocarcinoma. Providers:                Hildred Laser, MD, Jeanann Lewandowsky. Sharon Seller, RN, Hinton Rao, RN Referring MD:             Izola Price. Benay Spice, MD and Aquilla Solian, MD Medicines:                Meperidine 25 mg IV, Midazolam 2 mg IV Complications:            No immediate complications. Estimated Blood Loss:     Estimated blood loss was minimal. Procedure:                Pre-Anesthesia Assessment:                           - Prior to the procedure, a History and Physical                            was performed, and patient medications and                            allergies were reviewed. The patient's tolerance of                            previous anesthesia was also reviewed. The risks                            and benefits of the procedure and the sedation                            options and risks were discussed with the patient.                            All questions were answered, and informed consent                            was obtained. Prior Anticoagulants: The patient                            last took aspirin 3 days and previous NSAID  medication 1 day prior to the procedure. ASA Grade                            Assessment: III - A patient with severe systemic                            disease. After reviewing the risks and benefits,                            the patient was deemed in satisfactory condition to                            undergo the procedure.                           After obtaining  informed consent, the colonoscope                            was passed under direct vision. Throughout the                            procedure, the patient's blood pressure, pulse, and                            oxygen saturations were monitored continuously. The                            [id number] ([id number]) scope was introduced through                            the anus and advanced to the the terminal ileum,                            with identification of the appendiceal orifice and                            IC valve. The colonoscopy was somewhat difficult                            due to a redundant colon. The patient tolerated the                            procedure well. The quality of the bowel                            preparation was good. The ileocecal valve,                            appendiceal orifice, and rectum were photographed. Scope In: 11:44:55 AM Scope Out: 12:17:35 PM Scope Withdrawal Time: 0 hours 15 minutes 39 seconds  Total Procedure Duration: 0 hours 32 minutes 40 seconds  Findings:      The perianal and digital rectal examinations were normal.      The terminal ileum  appeared normal.      An area of mildly congested mucosa was found in the sigmoid colon. This       was biopsied with a cold forceps for histology.      A few small-mouthed diverticula were found in the sigmoid colon and       cecum.      Internal hemorrhoids were found during retroflexion. The hemorrhoids       were small. Impression:               - The examined portion of the ileum was normal.                           - Congested mucosa in the sigmoid colon. Biopsied.                           - Diverticulosis in the sigmoid colon and in the                            cecum.                           - Internal hemorrhoids. Moderate Sedation:      Moderate (conscious) sedation was administered by the endoscopy nurse       and supervised by the endoscopist. The following parameters  were       monitored: oxygen saturation, heart rate, blood pressure, CO2       capnography and response to care. Total physician intraservice time was       38 minutes. Recommendation:           - Patient has a contact number available for                            emergencies. The signs and symptoms of potential                            delayed complications were discussed with the                            patient. Return to normal activities tomorrow.                            Written discharge instructions were provided to the                            patient.                           - Resume previous diet today.                           - Continue present medications.                           - No aspirin, ibuprofen, naproxen, or other  non-steroidal anti-inflammatory drugs for 3 days.                           - Await pathology results.                           - No recommendation at this time regarding repeat                            colonoscopy. Procedure Code(s):        --- Professional ---                           [id number], Colonoscopy, flexible; with biopsy, single                            or multiple                           G0500, Moderate sedation services provided by the                            same physician or other qualified health care                            professional performing a gastrointestinal                            endoscopic service that sedation supports,                            requiring the presence of an independent trained                            observer to assist in the monitoring of the                            patient's level of consciousness and physiological                            status; initial 15 minutes of intra-service time;                            patient age 35 years or older (additional time may                            be reported with [phone], as appropriate)                            [phone], Moderate sedation services provided by the                            same physician or other qualified health care  professional performing the diagnostic or                            therapeutic service that the sedation supports,                            requiring the presence of an independent trained                            observer to assist in the monitoring of the                            patient's level of consciousness and physiological                            status; each additional 15 minutes intraservice                            time (List separately in addition to code for                            primary service)                           [phone], Moderate sedation services provided by the                            same physician or other qualified health care                            professional performing the diagnostic or                            therapeutic service that the sedation supports,                            requiring the presence of an independent trained                            observer to assist in the monitoring of the                            patient's level of consciousness and physiological                            status; each additional 15 minutes intraservice                            time (List separately in addition to code for                            primary service) Diagnosis Code(s):        --- Professional ---  K64.8, Other hemorrhoids                           K63.89, Other specified diseases of intestine                           R19.7, Diarrhea, unspecified                           Z87.19, Personal history of other diseases of the                            digestive system                           K57.30, Diverticulosis of large intestine without                            perforation or abscess without bleeding CPT copyright 2017 American  Medical Association. All rights reserved. The codes documented in this report are preliminary and upon coder review may  be revised to meet current compliance requirements. Hildred Laser, MD Hildred Laser, MD 10/15/2017 12:36:10 PM This report has been signed electronically. Number of Addenda: 0

## 2017-10-15 NOTE — H&P (Signed)
Jon Pena is an 67 y.o. male.   Chief Complaint: Patient is here for EGD and colonoscopy. HPI: Patient is 67 year old Caucasian male who has history of pancreatic carcinoma.  He is presently on nonconventional therapy.  He continues to experience epigastric pain and diarrhea with weight loss.  He has not responded to pancreatic enzyme supplement as well as antidiarrheals.  He is therefore undergoing diagnostic evaluation. He was seen by Dr. Benay Spice his oncologist last week.  Abdominal pelvic CT was obtained and it revealed progression of disease within the retroperitoneum as well as mild peritoneal carcinomatosis.  He also had new hypodense lesion in segment 3 of liver concerning for metastatic disease.  Study did not show gastric or colonic wall thickening.  Past Medical History:  Diagnosis Date  . GERD (gastroesophageal reflux disease)   . HOH (hard of hearing)   . Hyperlipidemia   . Pancreatic adenocarcinoma ([place]) 02/27/13   Stage IIB (T3 N1) status post distal pancreatectomy/splenectomy, status post chemotherapy and XRT  . Pancreatitis   . Pre-diabetes     Past Surgical History:  Procedure Laterality Date  . APPENDECTOMY    . COLONOSCOPY    . EUS N/A 02/20/2013   Procedure: UPPER ENDOSCOPIC ULTRASOUND (EUS) LINEAR;  Surgeon: Milus Banister, MD;  Location: [facility];  Service: Endoscopy;  Laterality: N/A;  . EUS N/A 02/01/2017   Procedure: UPPER ENDOSCOPIC ULTRASOUND (EUS) LINEAR;  Surgeon: Milus Banister, MD;  Location: [facility];  Service: Endoscopy;  Laterality: N/A;  . LAPAROSCOPIC APPENDECTOMY N/A 01/26/2013   Procedure: APPENDECTOMY LAPAROSCOPIC;  Surgeon: Zenovia Jarred, MD;  Location: [place];  Service: General;  Laterality: N/A;  . LAPAROSCOPIC SPLENECTOMY N/A 02/27/2013   Procedure: LAPAROSCOPIC SPLENECTOMY;  Surgeon: Stark Klein, MD;  Location: [place];  Service: General;  Laterality: N/A;  . PANCREATECTOMY N/A 02/27/2013   Procedure: LAPAROSCOPIC PANCREATECTOMY;   Surgeon: Stark Klein, MD;  Location: [place];  Service: General;  Laterality: N/A;  . POLYPECTOMY    . PORT-A-CATH REMOVAL Left 08/26/2013   Procedure: REMOVAL PORT-A-CATH;  Surgeon: Stark Klein, MD;  Location: [place];  Service: General;  Laterality: Left;  . PORTACATH PLACEMENT N/A 03/21/2013   Procedure: INSERTION PORT-A-CATH;  Surgeon: Stark Klein, MD;  Location: [place];  Service: General;  Laterality: N/A;  . TONSILLECTOMY      Family History  Problem Relation Age of Onset  . Pancreatitis Father   . Breast cancer Paternal Aunt   . Colon cancer Neg Hx   . Rectal cancer Neg Hx   . Stomach cancer Neg Hx    Social History:  reports that he quit smoking about 4 years ago. His smoking use included cigarettes. He has a 20.00 pack-year smoking history. He has never used smokeless tobacco. He reports that he does not drink alcohol or use drugs.  Allergies: No Known Allergies  Medications Prior to Admission  Medication Sig Dispense Refill  . aspirin EC 81 MG tablet Take 81 mg by mouth every other day.     . cholecalciferol (VITAMIN D) 1000 units tablet Take 1,000 Units by mouth daily.    . Cyanocobalamin (B-12) 1000 MCG/ML KIT Inject 1 mL as directed every 30 (thirty) days.    Marland Kitchen HYDROcodone-acetaminophen (NORCO/VICODIN) 5-325 MG tablet TAKE ONE TABLET EVERY FOUR HOURS AS NEEDED FOR MODERATE PAIN 60 tablet 0  . lipase/protease/amylase (CREON) 36000 UNITS CPEP capsule Take 4 caps with each meal and one cap with snacks. (Patient taking  differently: Take 36,000-144,000 Units by mouth See admin instructions. Take 4 caps with each meal and one cap with snacks.) 480 capsule 3  . meloxicam (MOBIC) 15 MG tablet TAKE ONE TABLET DAILY 30 tablet 1  . Multiple Vitamins-Minerals (MULTIVITAMIN WITH MINERALS) tablet Take 1 tablet by mouth daily.    . ondansetron (ZOFRAN-ODT) 4 MG disintegrating tablet Take 4 mg by mouth every 6 (six) hours as needed for nausea or  vomiting.     Marland Kitchen oxyCODONE-acetaminophen (PERCOCET/ROXICET) 5-325 MG tablet Take 1-2 tablets by mouth every 4 (four) hours as needed for severe pain. 60 tablet 0  . pantoprazole (PROTONIX) 40 MG tablet Take 1 tablet (40 mg total) by mouth daily. 90 tablet 3  . codeine 30 MG tablet Take 1 tablet (30 mg total) by mouth every 6 (six) hours. 120 tablet 0  . famotidine (PEPCID) 10 MG tablet Take 10 mg by mouth 2 (two) times daily as needed for heartburn.     Jon Pena Glycol-Propyl Glycol (LUBRICANT EYE DROPS) 0.4-0.3 % SOLN Place 1 drop into both eyes 3 (three) times daily as needed (for dry/irritated eyes.).      No results found for this or any previous visit (from the past 48 hour(s)). No results found.  ROS  Blood pressure (!) 152/76, pulse (!) 50, temperature 97.7 F (36.5 C), temperature source Oral, resp. rate 10, SpO2 97 %. Physical Exam  Constitutional:  Well-developed thin Caucasian male in NAD.  HENT:  Mouth/Throat: Oropharynx is clear and moist.  Eyes: Conjunctivae are normal. No scleral icterus.  Neck: No thyromegaly present.  Cardiovascular: Normal rate, regular rhythm and normal heart sounds.  No murmur heard. Respiratory: Effort normal and breath sounds normal.  GI:  Abdomen is symmetrical soft and nontender with organomegaly or masses.  Musculoskeletal: He exhibits no edema.  Lymphadenopathy:    He has no cervical adenopathy.  Neurological: He is alert.  Skin: Skin is warm and dry.     Assessment/Plan Epigastric pain and diarrhea in a patient with known pancreatic adenocarcinoma. He is not responding to therapy. Diagnostic EGD and colonoscopy.  Hildred Laser, MD 10/15/2017, 11:17 AM

## 2017-10-15 NOTE — Op Note (Signed)
[facility] Patient Name: Jon Pena Procedure Date: 10/15/2017 9:51 AM MRN: [id number] Date of Birth: Feb 04, 1951 Attending MD: Hildred Laser , MD CSN: [id number] Age: 67 Admit Type: Outpatient Procedure:                Upper GI endoscopy Indications:              Epigastric abdominal pain, Personal history of                            malignant neoplasm Providers:                Hildred Laser, MD, Otis Peak B. Sharon Seller, RN, Hinton Rao, RN, Aram Candela Referring MD:             Izola Price. Benay Spice, MD and Aquilla Solian, MD Medicines:                Promethazine 12.5 mg IV, Lidocaine spray,                            Meperidine 50 mg IV, Midazolam 4 mg IV Complications:            No immediate complications. Estimated Blood Loss:     Estimated blood loss was minimal. Procedure:                Pre-Anesthesia Assessment:                           - Prior to the procedure, a History and Physical                            was performed, and patient medications and                            allergies were reviewed. The patient's tolerance of                            previous anesthesia was also reviewed. The risks                            and benefits of the procedure and the sedation                            options and risks were discussed with the patient.                            All questions were answered, and informed consent                            was obtained. Prior Anticoagulants: The patient                            last took aspirin 3 days and previous NSAID  medication 1 day prior to the procedure. ASA Grade                            Assessment: III - A patient with severe systemic                            disease. After reviewing the risks and benefits,                            the patient was deemed in satisfactory condition to                            undergo the procedure.      After obtaining informed consent, the endoscope was                            passed under direct vision. Throughout the                            procedure, the patient's blood pressure, pulse, and                            oxygen saturations were monitored continuously. The                            [id number] ([id number]) scope was introduced through the                            mouth, and advanced to the second part of duodenum.                            The upper GI endoscopy was accomplished without                            difficulty. The patient tolerated the procedure                            well. Scope In: 11:28:10 AM Scope Out: 11:39:11 AM Total Procedure Duration: 0 hours 11 minutes 1 second  Findings:      The examined esophagus was normal.      The Z-line was irregular and was found 40 cm from the incisors.      A 2 cm hiatal hernia was present.      Diffuse mild inflammation characterized by congestion (edema), erythema       and friability was found in the gastric body and on the posterior wall       of the stomach. Biopsies were taken with a cold forceps for histology.       The pathology specimen was placed into Bottle Number 2.      The exam of the stomach was otherwise normal.      The duodenal bulb and second portion of the duodenum were normal.       Biopsies were taken with a cold forceps for histology. Biopsies for       histology were taken with a  cold forceps for evaluation of celiac       disease. Impression:               - Normal esophagus.                           - Z-line irregular, 40 cm from the incisors.                           - 2 cm hiatal hernia.                           - Gastritis. Biopsied.                           - Normal duodenal bulb and second portion of the                            duodenum. Biopsied. Moderate Sedation:      Moderate (conscious) sedation was administered by the endoscopy nurse       and supervised by the  endoscopist. The following parameters were       monitored: oxygen saturation, heart rate, blood pressure, CO2       capnography and response to care. Total physician intraservice time was       24 minutes. Recommendation:           - Patient has a contact number available for                            emergencies. The signs and symptoms of potential                            delayed complications were discussed with the                            patient. Return to normal activities tomorrow.                            Written discharge instructions were provided to the                            patient.                           - Resume previous diet today.                           - Continue present medications.                           - No aspirin, ibuprofen, naproxen, or other                            non-steroidal anti-inflammatory drugs for 3 days.                           - Await pathology results. Procedure Code(s):        ---  Professional ---                           [phone], Esophagogastroduodenoscopy, flexible,                            transoral; with biopsy, single or multiple                           G0500, Moderate sedation services provided by the                            same physician or other qualified health care                            professional performing a gastrointestinal                            endoscopic service that sedation supports,                            requiring the presence of an independent trained                            observer to assist in the monitoring of the                            patient's level of consciousness and physiological                            status; initial 15 minutes of intra-service time;                            patient age 54 years or older (additional time may                            be reported with [phone], as appropriate)                           [phone], Moderate sedation services provided  by the                            same physician or other qualified health care                            professional performing the diagnostic or                            therapeutic service that the sedation supports,                            requiring the presence of an independent trained                            observer to assist in the monitoring of  the                            patient's level of consciousness and physiological                            status; each additional 15 minutes intraservice                            time (List separately in addition to code for                            primary service) Diagnosis Code(s):        --- Professional ---                           K22.8, Other specified diseases of esophagus                           K44.9, Diaphragmatic hernia without obstruction or                            gangrene                           K29.70, Gastritis, unspecified, without bleeding                           R10.13, Epigastric pain                           Z85.9, Personal history of malignant neoplasm,                            unspecified CPT copyright 2017 American Medical Association. All rights reserved. The codes documented in this report are preliminary and upon coder review may  be revised to meet current compliance requirements. Hildred Laser, MD Hildred Laser, MD 10/15/2017 12:30:49 PM This report has been signed electronically. Number of Addenda: 0

## 2017-10-15 NOTE — Discharge Instructions (Signed)
No aspirin or NSAIDs for 3 days. Resume other medications as before. Resume usual diet. No driving for 24 hours. Physician will call with biopsy results.     Esophagogastroduodenoscopy, Care After Refer to this sheet in the next few weeks. These instructions provide you with information about caring for yourself after your procedure. Your health care provider may also give you more specific instructions. Your treatment has been planned according to current medical practices, but problems sometimes occur. Call your health care provider if you have any problems or questions after your procedure. What can I expect after the procedure? After the procedure, it is common to have:  A sore throat.  Nausea.  Bloating.  Dizziness.  Fatigue.  Follow these instructions at home:  Do not eat or drink anything until the numbing medicine (local anesthetic) has worn off and your gag reflex has returned. You will know that the local anesthetic has worn off when you can swallow comfortably.  Do not drive for 24 hours if you received a medicine to help you relax (sedative).  If your health care provider took a tissue sample for testing during the procedure, make sure to get your test results. This is your responsibility. Ask your health care provider or the department performing the test when your results will be ready.  Keep all follow-up visits as told by your health care provider. This is important. Contact a health care provider if:  You cannot stop coughing.  You are not urinating.  You are urinating less than usual. Get help right away if:  You have trouble swallowing.  You cannot eat or drink.  You have throat or chest pain that gets worse.  You are dizzy or light-headed.  You faint.  You have nausea or vomiting.  You have chills.  You have a fever.  You have severe abdominal pain.  You have black, tarry, or bloody stools. This information is not intended to replace  advice given to you by your health care provider. Make sure you discuss any questions you have with your health care provider. Document Released: 05/22/2012 Document Revised: 11/11/2015 Document Reviewed: 04/29/2015 Elsevier Interactive Patient Education  2018 [employer].    Colonoscopy, Adult, Care After This sheet gives you information about how to care for yourself after your procedure. Your doctor may also give you more specific instructions. If you have problems or questions, call your doctor. Follow these instructions at home: General instructions   For the first 24 hours after the procedure: ? Do not drive or use machinery. ? Do not sign important documents. ? Do not drink alcohol. ? Do your daily activities more slowly than normal. ? Eat foods that are soft and easy to digest. ? Rest often.  Take over-the-counter or prescription medicines only as told by your doctor.  It is up to you to get the results of your procedure. Ask your doctor, or the department performing the procedure, when your results will be ready. To help cramping and bloating:  Try walking around.  Put heat on your belly (abdomen) as told by your doctor. Use a heat source that your doctor recommends, such as a moist heat pack or a heating pad. ? Put a towel between your skin and the heat source. ? Leave the heat on for 20-30 minutes. ? Remove the heat if your skin turns bright red. This is especially important if you cannot feel pain, heat, or cold. You can get burned. Eating and drinking  Drink enough  fluid to keep your pee (urine) clear or pale yellow.  Return to your normal diet as told by your doctor. Avoid heavy or fried foods that are hard to digest.  Avoid drinking alcohol for as long as told by your doctor. Contact a doctor if:  You have blood in your poop (stool) 2-3 days after the procedure. Get help right away if:  You have more than a small amount of blood in your poop.  You see  large clumps of tissue (blood clots) in your poop.  Your belly is swollen.  You feel sick to your stomach (nauseous).  You throw up (vomit).  You have a fever.  You have belly pain that gets worse, and medicine does not help your pain. This information is not intended to replace advice given to you by your health care provider. Make sure you discuss any questions you have with your health care provider. Document Released: 07/08/2010 Document Revised: 02/28/2016 Document Reviewed: 02/28/2016 Elsevier Interactive Patient Education  2017 [employer]     Diverticulosis Diverticulosis is a condition that develops when small pouches (diverticula) form in the wall of the large intestine (colon). The colon is where water is absorbed and stool is formed. The pouches form when the inside layer of the colon pushes through weak spots in the outer layers of the colon. You may have a few pouches or many of them. What are the causes? The cause of this condition is not known. What increases the risk? The following factors may make you more likely to develop this condition:  Being older than age 71. Your risk for this condition increases with age. Diverticulosis is rare among people younger than age 2. By age 35, many people have it.  Eating a low-fiber diet.  Having frequent constipation.  Being overweight.  Not getting enough exercise.  Smoking.  Taking over-the-counter pain medicines, like aspirin and ibuprofen.  Having a family history of diverticulosis.  What are the signs or symptoms? In most people, there are no symptoms of this condition. If you do have symptoms, they may include:  Bloating.  Cramps in the abdomen.  Constipation or diarrhea.  Pain in the lower left side of the abdomen.  How is this diagnosed? This condition is most often diagnosed during an exam for other colon problems. Because diverticulosis usually has no symptoms, it often cannot be diagnosed  independently. This condition may be diagnosed by:  Using a flexible scope to examine the colon (colonoscopy).  Taking an X-ray of the colon after dye has been put into the colon (barium enema).  Doing a CT scan.  How is this treated? You may not need treatment for this condition if you have never developed an infection related to diverticulosis. If you have had an infection before, treatment may include:  Eating a high-fiber diet. This may include eating more fruits, vegetables, and grains.  Taking a fiber supplement.  Taking a live bacteria supplement (probiotic).  Taking medicine to relax your colon.  Taking antibiotic medicines.  Follow these instructions at home:  Drink 6-8 glasses of water or more each day to prevent constipation.  Try not to strain when you have a bowel movement.  If you have had an infection before: ? Eat more fiber as directed by your health care provider or your diet and nutrition specialist (dietitian). ? Take a fiber supplement or probiotic, if your health care provider approves.  Take over-the-counter and prescription medicines only as told by your health  care provider.  If you were prescribed an antibiotic, take it as told by your health care provider. Do not stop taking the antibiotic even if you start to feel better.  Keep all follow-up visits as told by your health care provider. This is important. Contact a health care provider if:  You have pain in your abdomen.  You have bloating.  You have cramps.  You have not had a bowel movement in 3 days. Get help right away if:  Your pain gets worse.  Your bloating becomes very bad.  You have a fever or chills, and your symptoms suddenly get worse.  You vomit.  You have bowel movements that are bloody or black.  You have bleeding from your rectum. Summary  Diverticulosis is a condition that develops when small pouches (diverticula) form in the wall of the large intestine  (colon).  You may have a few pouches or many of them.  This condition is most often diagnosed during an exam for other colon problems.  If you have had an infection related to diverticulosis, treatment may include increasing the fiber in your diet, taking supplements, or taking medicines. This information is not intended to replace advice given to you by your health care provider. Make sure you discuss any questions you have with your health care provider. Document Released: 03/02/2004 Document Revised: 04/24/2016 Document Reviewed: 04/24/2016 Elsevier Interactive Patient Education  2017 [employer].    Hemorrhoids Hemorrhoids are swollen veins in and around the rectum or anus. There are two types of hemorrhoids:  Internal hemorrhoids. These occur in the veins that are just inside the rectum. They may poke through to the outside and become irritated and painful.  External hemorrhoids. These occur in the veins that are outside of the anus and can be felt as a painful swelling or hard lump near the anus.  Most hemorrhoids do not cause serious problems, and they can be managed with home treatments such as diet and lifestyle changes. If home treatments do not help your symptoms, procedures can be done to shrink or remove the hemorrhoids. What are the causes? This condition is caused by increased pressure in the anal area. This pressure may result from various things, including:  Constipation.  Straining to have a bowel movement.  Diarrhea.  Pregnancy.  Obesity.  Sitting for long periods of time.  Heavy lifting or other activity that causes you to strain.  Anal sex.  What are the signs or symptoms? Symptoms of this condition include:  Pain.  Anal itching or irritation.  Rectal bleeding.  Leakage of stool (feces).  Anal swelling.  One or more lumps around the anus.  How is this diagnosed? This condition can often be diagnosed through a visual exam. Other exams or  tests may also be done, such as:  Examination of the rectal area with a gloved hand (digital rectal exam).  Examination of the anal canal using a small tube (anoscope).  A blood test, if you have lost a significant amount of blood.  A test to look inside the colon (sigmoidoscopy or colonoscopy).  How is this treated? This condition can usually be treated at home. However, various procedures may be done if dietary changes, lifestyle changes, and other home treatments do not help your symptoms. These procedures can help make the hemorrhoids smaller or remove them completely. Some of these procedures involve surgery, and others do not. Common procedures include:  Rubber band ligation. Rubber bands are placed at the base of  the hemorrhoids to cut off the blood supply to them.  Sclerotherapy. Medicine is injected into the hemorrhoids to shrink them.  Infrared coagulation. A type of light energy is used to get rid of the hemorrhoids.  Hemorrhoidectomy surgery. The hemorrhoids are surgically removed, and the veins that supply them are tied off.  Stapled hemorrhoidopexy surgery. A circular stapling device is used to remove the hemorrhoids and use staples to cut off the blood supply to them.  Follow these instructions at home: Eating and drinking  Eat foods that have a lot of fiber in them, such as whole grains, beans, nuts, fruits, and vegetables. Ask your health care provider about taking products that have added fiber (fiber supplements).  Drink enough fluid to keep your urine clear or pale yellow. Managing pain and swelling  Take warm sitz baths for 20 minutes, 3-4 times a day to ease pain and discomfort.  If directed, apply ice to the affected area. Using ice packs between sitz baths may be helpful. ? Put ice in a plastic bag. ? Place a towel between your skin and the bag. ? Leave the ice on for 20 minutes, 2-3 times a day. General instructions  Take over-the-counter and  prescription medicines only as told by your health care provider.  Use medicated creams or suppositories as told.  Exercise regularly.  Go to the bathroom when you have the urge to have a bowel movement. Do not wait.  Avoid straining to have bowel movements.  Keep the anal area dry and clean. Use wet toilet paper or moist towelettes after a bowel movement.  Do not sit on the toilet for long periods of time. This increases blood pooling and pain. Contact a health care provider if:  You have increasing pain and swelling that are not controlled by treatment or medicine.  You have uncontrolled bleeding.  You have difficulty having a bowel movement, or you are unable to have a bowel movement.  You have pain or inflammation outside the area of the hemorrhoids. This information is not intended to replace advice given to you by your health care provider. Make sure you discuss any questions you have with your health care provider. Document Released: 06/02/2000 Document Revised: 11/03/2015 Document Reviewed: 02/17/2015 Elsevier Interactive Patient Education  [employer].

## 2017-10-17 ENCOUNTER — Encounter (HOSPITAL_COMMUNITY): Payer: Self-pay | Admitting: Internal Medicine

## 2017-10-23 ENCOUNTER — Other Ambulatory Visit (INDEPENDENT_AMBULATORY_CARE_PROVIDER_SITE_OTHER): Payer: Self-pay | Admitting: Internal Medicine

## 2017-10-23 DIAGNOSIS — C252 Malignant neoplasm of tail of pancreas: Secondary | ICD-10-CM

## 2017-10-23 MED ORDER — PANCRELIPASE (LIP-PROT-AMYL) 36000-114000 UNITS PO CPEP: 36000.0000 [IU] | ORAL_CAPSULE | ORAL | 2 refills | Status: AC

## 2017-10-24 ENCOUNTER — Inpatient Hospital Stay: Payer: PPO

## 2017-10-24 ENCOUNTER — Telehealth: Payer: Self-pay | Admitting: Oncology

## 2017-10-24 ENCOUNTER — Encounter: Payer: Self-pay | Admitting: Oncology

## 2017-10-24 ENCOUNTER — Inpatient Hospital Stay: Payer: PPO | Attending: Oncology | Admitting: Oncology

## 2017-10-24 VITALS — BP 121/60 | HR 58 | Temp 98.0°F | Resp 18 | Ht 71.0 in | Wt 147.4 lb

## 2017-10-24 DIAGNOSIS — R197 Diarrhea, unspecified: Secondary | ICD-10-CM | POA: Insufficient documentation

## 2017-10-24 DIAGNOSIS — K769 Liver disease, unspecified: Secondary | ICD-10-CM | POA: Insufficient documentation

## 2017-10-24 DIAGNOSIS — R1900 Intra-abdominal and pelvic swelling, mass and lump, unspecified site: Secondary | ICD-10-CM | POA: Insufficient documentation

## 2017-10-24 DIAGNOSIS — C252 Malignant neoplasm of tail of pancreas: Secondary | ICD-10-CM

## 2017-10-24 DIAGNOSIS — N281 Cyst of kidney, acquired: Secondary | ICD-10-CM | POA: Insufficient documentation

## 2017-10-24 DIAGNOSIS — Z923 Personal history of irradiation: Secondary | ICD-10-CM | POA: Diagnosis not present

## 2017-10-24 DIAGNOSIS — Z87891 Personal history of nicotine dependence: Secondary | ICD-10-CM | POA: Diagnosis not present

## 2017-10-24 DIAGNOSIS — Z9081 Acquired absence of spleen: Secondary | ICD-10-CM | POA: Diagnosis not present

## 2017-10-24 DIAGNOSIS — Z9049 Acquired absence of other specified parts of digestive tract: Secondary | ICD-10-CM | POA: Diagnosis not present

## 2017-10-24 DIAGNOSIS — G893 Neoplasm related pain (acute) (chronic): Secondary | ICD-10-CM | POA: Insufficient documentation

## 2017-10-24 DIAGNOSIS — C250 Malignant neoplasm of head of pancreas: Secondary | ICD-10-CM

## 2017-10-24 DIAGNOSIS — Z9221 Personal history of antineoplastic chemotherapy: Secondary | ICD-10-CM | POA: Insufficient documentation

## 2017-10-24 MED ORDER — HYDROCODONE-ACETAMINOPHEN 5-325 MG PO TABS: ORAL_TABLET | ORAL | 0 refills | Status: DC

## 2017-10-24 MED ORDER — OXYCODONE-ACETAMINOPHEN 5-325 MG PO TABS: 1.0000 | ORAL_TABLET | Freq: Two times a day (BID) | ORAL | 0 refills | Status: DC | PRN

## 2017-10-24 NOTE — Telephone Encounter (Signed)
Scheduled appt per 5/8 los - pt is aware of appt date and time.

## 2017-10-24 NOTE — Progress Notes (Signed)
[facility] OFFICE PROGRESS NOTE   Diagnosis: Pancreas cancer  INTERVAL HISTORY:   Jon Pena returns as scheduled.  He underwent upper and lower endoscopy procedures 10/15/2017.  There was no explanation for the diarrhea.  Biopsies from the duodenum, stomach, and colon were negative.  An H. pylori stain on the gastric biopsy was negative. He developed a headache when he began codeine for treatment of diarrhea.  He takes hydrocodone approximately 4 times per day.  This relieves pain.  He continues to have intermittent diarrhea.  He reports a good appetite. Jon Pena has decided to go to a clinic in [place] for a 3-week stay.  He will receive multiple alternative therapies there.   Objective:  Vital signs in last 24 hours:  Blood pressure 121/60, pulse (!) 58, temperature 98 F (36.7 C), temperature source Oral, resp. rate 18, height 5\' 11"  (1.803 m), weight 147 lb 6.4 oz (66.9 kg), SpO2 97 %.    Resp: Lungs clear bilaterally Cardio: Regular rate and rhythm GI: No hepatosplenomegaly, firm fullness surrounding the upper umbilicus with associated tenderness Vascular: No leg edema  Lab Results:  Lab Results  Component Value Date   WBC 8.3 10/08/2017   HGB 14.2 10/08/2017   HCT 42.0 10/08/2017   MCV 95.2 10/08/2017   PLT 280 10/08/2017   NEUTROABS 4.7 10/08/2017    CMP     Component Value Date/Time   NA 138 10/08/2017 1308   NA 138 06/08/2017 1306   K 4.3 10/08/2017 1308   K 4.4 06/08/2017 1306   CL 105 10/08/2017 1308   CO2 24 10/08/2017 1308   CO2 25 06/08/2017 1306   GLUCOSE 145 (H) 10/08/2017 1308   GLUCOSE 96 06/08/2017 1306   BUN 25 10/08/2017 1308   BUN 19.2 06/08/2017 1306   CREATININE 1.07 10/08/2017 1308   CREATININE 0.9 06/08/2017 1306   CALCIUM 9.4 10/08/2017 1308   CALCIUM 9.3 06/08/2017 1306   PROT 6.8 10/08/2017 1308   PROT 7.0 06/08/2017 1306   ALBUMIN 3.9 10/08/2017 1308   ALBUMIN 4.1 06/08/2017 1306   AST 15 10/08/2017  1308   AST 15 06/08/2017 1306   ALT 17 10/08/2017 1308   ALT 13 06/08/2017 1306   ALKPHOS 89 10/08/2017 1308   ALKPHOS 73 06/08/2017 1306   BILITOT 0.5 10/08/2017 1308   BILITOT 0.53 06/08/2017 1306   GFRNONAA >60 10/08/2017 1308   GFRAA >60 10/08/2017 1308    Medications: I have reviewed the patient's current medications.   Assessment/Plan: 1. 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  Adjuvant gemcitabine 03/27/2013, 04/03/2013 and 04/10/2013.   Initiation of radiation and capecitabine 04/28/2013. Discontinued 05/22/2013 secondary to nausea.   Adjuvant gemcitabine resumed 06/17/2013. Last gemcitabine given 07/22/2013.  PET scan 01/22/2017-hypermetabolic soft tissue adjacent to the resection site, hypermetabolic adjacent satellite lesion, 2 areas of hypermetabolism in the liver  EUS biopsy of pancreas bed mass on 02/01/2017-adenocarcinoma  CT abdomen/pelvis 06/18/2017-partially calcified soft tissue mass to the left of the SMA is slightly larger with extension superior to the left of the celiac and to the left adrenal gland, no new sites of disease. 1.6 cm left renal lesion has enlarged from 1.3 cm  CT abdomen/pelvis 10/09/2017- increased size of infiltrative retroperitoneal tumor, more conspicuous peritoneal nodules, new segment 3 liver lesion, stable left renal mass 2. Left renal cyst with a mural nodular component on MRI 02/20/2013. Indeterminate. 3. Acute appendicitis status  post laparoscopic appendectomy 01/26/2013. 4. Port-A-Cath placement 03/21/2013, removed 08/26/2013. 5. Right mid to low back sebaceous cyst.  6. History of Rosacea rash over the face. 7. History of tobacco use  8. Screening chest CT 08/05/2013 revealed masslike and nodular opacities in the medial segment of the right middle lobe and in the inferior aspect of the lingula, followup chest CT 10/31/2013 revealed  improvement 9. Cystic-appearing lesion in the body of the pancreas adjacent to the resection line noted on the CT 08/05/2013 and 10/31/2013-discussed with Dr. Barry Dienes, felt to represent postoperative change 10. "Pancreatitis "-managed by primary M.D. 11. Frequent bowel movements-likely secondary to pancreas insufficiency, he continues Creon, upper and lower endoscopies 10/15/2017-no explanation for the diarrhea, negative biopsies   Disposition: Jon Pena has recurrent pancreas cancer.  He is symptomatic with pain.  The diarrhea is also likely in someway related to the recurrent pancreas cancer.  He requested a lab for vasoactive intestinal peptide.  I recommend a trial of systemic chemotherapy.  I also recommend submitting his tumor for Foundation 1 testing.  He does not wish to consider standard treatment at present.  He has decided to attend an alternative medicine clinic in [place] for 3 weeks.  He will leave on 10/28/2017.  He agrees to a follow-up appointment here on 12/04/2017.  We refilled prescriptions for hydrocodone and oxycodone today.  25 minutes were spent with the patient today.  The majority of the time was used for counseling and coordination of care.  Betsy Coder, MD  10/24/2017  10:28 AM

## 2017-10-25 LAB — CANCER ANTIGEN 19-9: CA 19-9: 2 U/mL (ref 0–35)

## 2017-10-25 MED ORDER — LEUPROLIDE ACETATE (3 MONTH) 22.5 MG IM KIT
PACK | INTRAMUSCULAR | Status: AC
  Filled 2017-10-25: qty 22.5

## 2017-10-26 ENCOUNTER — Telehealth: Payer: Self-pay

## 2017-10-26 NOTE — Telephone Encounter (Signed)
Spoke with pt. Per lab, Vasoactive lab unable to be drawn when requested due to special kit needed and special instructions for pt to follow. Pt will discuss with MD at next visit. MD made aware.

## 2017-11-19 ENCOUNTER — Telehealth: Payer: Self-pay | Admitting: Oncology

## 2017-11-19 DIAGNOSIS — Z1389 Encounter for screening for other disorder: Secondary | ICD-10-CM | POA: Diagnosis not present

## 2017-11-19 DIAGNOSIS — C799 Secondary malignant neoplasm of unspecified site: Secondary | ICD-10-CM | POA: Diagnosis not present

## 2017-11-19 DIAGNOSIS — Z681 Body mass index (BMI) 19 or less, adult: Secondary | ICD-10-CM | POA: Diagnosis not present

## 2017-11-19 NOTE — Telephone Encounter (Signed)
Faxed medical records to [facility]: [facility] Ree [phone] on 11/19/17, Release ID: [id number]

## 2017-11-20 ENCOUNTER — Telehealth: Payer: Self-pay

## 2017-11-20 NOTE — Telephone Encounter (Signed)
Phone call placed to patient to schedule visit with Palliative Care. Scheduled for 11/21/17

## 2017-11-21 ENCOUNTER — Other Ambulatory Visit: Payer: PPO | Admitting: Hospice and Palliative Medicine

## 2017-11-21 DIAGNOSIS — Z515 Encounter for palliative care: Secondary | ICD-10-CM

## 2017-11-21 NOTE — Progress Notes (Signed)
PALLIATIVE CARE CONSULT VISIT   PATIENT NAME: Jon Pena DOB: 11-Nov-1950 MRN: [id number]  PRIMARY CARE PROVIDER:   Sharilyn Sites, MD  REFERRING PROVIDER:      Sharilyn Sites, [address] [place], [place] [address]  RESPONSIBLE PARTY:   Self  ASSESSMENT:     Patient just returned from an alternative medical clinic in [place] where he received diet therapy for his cancer. Patient says he has been declining with weakness and poor oral intake for the past several months and estimates that he has lost ~30lbs in three months down to current weight of 130. He feels that the alternative therapy likely slowed the progression of the cancer but stated a belief that the cancer would soon progress and be terminal. He is meeting with his attorney later today and has started planning for his end of life. Patient says he does not want to return to the [facility] nor pursue any more workup or treatment. We talked about hospice involvement and he wanted to pursue this. Patient says he wants to die at home.  Note that he has 24/7 caregivers for his mother that has dementia. He does not want his mother to know of his illness and asked that I remove my name badge and not use the word palliative care or hospice around her.   Patient continues to have chronic diarrhea that has not responded to lomotil or imodium. He is eating a bland diet and taking simethicone and Protonix for indigestion.   He has some pain but does not like taking oxycodone (currently taking a quarter a tablet once-twice daily). He endorses anxiety that exacerbates his pain.   Patient has appointed his sister to be his 57. He does not want to be resuscitated and I completed a DNR form today.    RECOMMENDATIONS and PLAN:  1. Hospice referral if ok with Dr. Benay Spice 2. DNR 3. Recommend increasing simethicone to 1 tablet TID 4. Oral supplements TID 5. Recommend trial of alprazolam  I spent 45 minutes providing this  consultation,  From 1000 to 1045. More than 50% of the time in this consultation was spent coordinating communication.   HISTORY OF PRESENT ILLNESS:  Jon Pena is a 67 y.o. 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 Palliative Care was asked to help address goals of care.   CODE STATUS: DNR  PPS: 40% HOSPICE ELIGIBILITY/DIAGNOSIS: YES  PAST MEDICAL HISTORY:  Past Medical History:  Diagnosis Date  . GERD (gastroesophageal reflux disease)   . HOH (hard of hearing)   . Hyperlipidemia   . Pancreatic adenocarcinoma ([place]) 02/27/13   Stage IIB (T3 N1) status post distal pancreatectomy/splenectomy, status post chemotherapy and XRT  . Pancreatitis   . Pre-diabetes     SOCIAL HX:  Social History   Tobacco Use  . Smoking status: Former Smoker    Packs/day: 1.00    Years: 20.00    Pack years: 20.00    Types: Cigarettes    Last attempt to quit: 02/11/2013    Years since quitting: 4.7  . Smokeless tobacco: Never Used  . Tobacco comment: hasn't smoked in past 14 days  Substance Use Topics  . Alcohol use: No    Comment: occasional-not since surgery 9/14    ALLERGIES: No Known Allergies   PERTINENT MEDICATIONS:  Outpatient Encounter Medications as of 11/21/2017  Medication Sig  .  aspirin EC 81 MG tablet Take 1 tablet (81 mg total) by mouth every other day.  . cholecalciferol (VITAMIN D) 1000 units tablet Take 1,000 Units by mouth daily.  . Cyanocobalamin (B-12) 1000 MCG/ML KIT Inject 1 mL as directed every 30 (thirty) days.  . famotidine (PEPCID) 10 MG tablet Take 10 mg by mouth 2 (two) times daily as needed for heartburn.   Marland Kitchen HYDROcodone-acetaminophen (NORCO/VICODIN) 5-325 MG tablet TAKE ONE TABLET EVERY FOUR HOURS AS NEEDED FOR MODERATE PAIN  . lipase/protease/amylase (CREON) 36000 UNITS CPEP capsule Take 1-4 capsules (36,000-144,000 Units total) by  mouth See admin instructions. Take 4 caps with each meal and one cap with snacks. (Patient taking differently: Take 36,000-144,000 Units by mouth See admin instructions. Take 1 caps with each meal and one cap with snacks.)  . meloxicam (MOBIC) 15 MG tablet Take 1 tablet (15 mg total) by mouth daily.  . Multiple Vitamins-Minerals (MULTIVITAMIN WITH MINERALS) tablet Take 1 tablet by mouth daily.  . ondansetron (ZOFRAN-ODT) 4 MG disintegrating tablet Take 4 mg by mouth every 6 (six) hours as needed for nausea or vomiting.   Marland Kitchen oxyCODONE-acetaminophen (PERCOCET/ROXICET) 5-325 MG tablet Take 1 tablet by mouth 2 (two) times daily as needed (for pain not relieved by hydrocodone).  . pantoprazole (PROTONIX) 40 MG tablet Take 1 tablet (40 mg total) by mouth daily.  Vladimir Faster Glycol-Propyl Glycol (LUBRICANT EYE DROPS) 0.4-0.3 % SOLN Place 1 drop into both eyes 3 (three) times daily as needed (for dry/irritated eyes.).   No facility-administered encounter medications on file as of 11/21/2017.     PHYSICAL EXAM:   General: NAD, frail appearing, thin Pulmonary: nonlabored Extremities: no edema, no joint deformities Skin: no rashes Neurological: Weakness but otherwise nonfocal  Irean Hong, NP

## 2017-11-22 ENCOUNTER — Other Ambulatory Visit: Payer: Self-pay

## 2017-11-22 ENCOUNTER — Inpatient Hospital Stay

## 2017-11-22 ENCOUNTER — Inpatient Hospital Stay: Attending: Oncology | Admitting: Oncology

## 2017-11-22 DIAGNOSIS — R110 Nausea: Secondary | ICD-10-CM | POA: Diagnosis not present

## 2017-11-22 DIAGNOSIS — C7800 Secondary malignant neoplasm of unspecified lung: Secondary | ICD-10-CM | POA: Diagnosis not present

## 2017-11-22 DIAGNOSIS — C787 Secondary malignant neoplasm of liver and intrahepatic bile duct: Secondary | ICD-10-CM | POA: Diagnosis not present

## 2017-11-22 DIAGNOSIS — R197 Diarrhea, unspecified: Secondary | ICD-10-CM | POA: Diagnosis not present

## 2017-11-22 DIAGNOSIS — C259 Malignant neoplasm of pancreas, unspecified: Secondary | ICD-10-CM | POA: Diagnosis not present

## 2017-11-22 DIAGNOSIS — R1900 Intra-abdominal and pelvic swelling, mass and lump, unspecified site: Secondary | ICD-10-CM | POA: Insufficient documentation

## 2017-11-22 DIAGNOSIS — R1084 Generalized abdominal pain: Secondary | ICD-10-CM | POA: Diagnosis not present

## 2017-11-22 DIAGNOSIS — N281 Cyst of kidney, acquired: Secondary | ICD-10-CM | POA: Insufficient documentation

## 2017-11-22 DIAGNOSIS — C786 Secondary malignant neoplasm of retroperitoneum and peritoneum: Secondary | ICD-10-CM | POA: Diagnosis not present

## 2017-11-22 DIAGNOSIS — C252 Malignant neoplasm of tail of pancreas: Secondary | ICD-10-CM | POA: Insufficient documentation

## 2017-11-22 DIAGNOSIS — R918 Other nonspecific abnormal finding of lung field: Secondary | ICD-10-CM | POA: Insufficient documentation

## 2017-11-22 DIAGNOSIS — L719 Rosacea, unspecified: Secondary | ICD-10-CM | POA: Diagnosis not present

## 2017-11-22 DIAGNOSIS — E785 Hyperlipidemia, unspecified: Secondary | ICD-10-CM | POA: Diagnosis not present

## 2017-11-22 DIAGNOSIS — Z9221 Personal history of antineoplastic chemotherapy: Secondary | ICD-10-CM | POA: Insufficient documentation

## 2017-11-23 ENCOUNTER — Other Ambulatory Visit: Payer: Self-pay | Admitting: Emergency Medicine

## 2017-11-23 DIAGNOSIS — R1084 Generalized abdominal pain: Secondary | ICD-10-CM | POA: Diagnosis not present

## 2017-11-23 DIAGNOSIS — C786 Secondary malignant neoplasm of retroperitoneum and peritoneum: Secondary | ICD-10-CM | POA: Diagnosis not present

## 2017-11-23 DIAGNOSIS — R197 Diarrhea, unspecified: Secondary | ICD-10-CM | POA: Diagnosis not present

## 2017-11-23 DIAGNOSIS — C787 Secondary malignant neoplasm of liver and intrahepatic bile duct: Secondary | ICD-10-CM | POA: Diagnosis not present

## 2017-11-23 DIAGNOSIS — C259 Malignant neoplasm of pancreas, unspecified: Secondary | ICD-10-CM | POA: Diagnosis not present

## 2017-11-23 DIAGNOSIS — C7800 Secondary malignant neoplasm of unspecified lung: Secondary | ICD-10-CM | POA: Diagnosis not present

## 2017-11-23 MED ORDER — DIPHENOXYLATE-ATROPINE 2.5-0.025 MG PO TABS: 1.0000 | ORAL_TABLET | Freq: Four times a day (QID) | ORAL | 0 refills | Status: AC | PRN

## 2017-11-26 DIAGNOSIS — R197 Diarrhea, unspecified: Secondary | ICD-10-CM | POA: Diagnosis not present

## 2017-11-26 DIAGNOSIS — C7800 Secondary malignant neoplasm of unspecified lung: Secondary | ICD-10-CM | POA: Diagnosis not present

## 2017-11-26 DIAGNOSIS — C786 Secondary malignant neoplasm of retroperitoneum and peritoneum: Secondary | ICD-10-CM | POA: Diagnosis not present

## 2017-11-26 DIAGNOSIS — R1084 Generalized abdominal pain: Secondary | ICD-10-CM | POA: Diagnosis not present

## 2017-11-26 DIAGNOSIS — C259 Malignant neoplasm of pancreas, unspecified: Secondary | ICD-10-CM | POA: Diagnosis not present

## 2017-11-26 DIAGNOSIS — C787 Secondary malignant neoplasm of liver and intrahepatic bile duct: Secondary | ICD-10-CM | POA: Diagnosis not present

## 2017-11-28 ENCOUNTER — Other Ambulatory Visit: Payer: Self-pay | Admitting: *Deleted

## 2017-11-28 DIAGNOSIS — C252 Malignant neoplasm of tail of pancreas: Secondary | ICD-10-CM

## 2017-11-28 DIAGNOSIS — C787 Secondary malignant neoplasm of liver and intrahepatic bile duct: Secondary | ICD-10-CM | POA: Diagnosis not present

## 2017-11-28 DIAGNOSIS — C786 Secondary malignant neoplasm of retroperitoneum and peritoneum: Secondary | ICD-10-CM | POA: Diagnosis not present

## 2017-11-28 DIAGNOSIS — R1084 Generalized abdominal pain: Secondary | ICD-10-CM | POA: Diagnosis not present

## 2017-11-28 DIAGNOSIS — C7800 Secondary malignant neoplasm of unspecified lung: Secondary | ICD-10-CM | POA: Diagnosis not present

## 2017-11-28 DIAGNOSIS — R197 Diarrhea, unspecified: Secondary | ICD-10-CM | POA: Diagnosis not present

## 2017-11-28 DIAGNOSIS — C259 Malignant neoplasm of pancreas, unspecified: Secondary | ICD-10-CM | POA: Diagnosis not present

## 2017-11-28 MED ORDER — HYDROCODONE-ACETAMINOPHEN 5-325 MG PO TABS: ORAL_TABLET | ORAL | 0 refills | Status: DC

## 2017-11-29 ENCOUNTER — Other Ambulatory Visit: Payer: Self-pay | Admitting: *Deleted

## 2017-11-29 DIAGNOSIS — C250 Malignant neoplasm of head of pancreas: Secondary | ICD-10-CM

## 2017-11-29 MED ORDER — OXYCODONE-ACETAMINOPHEN 5-325 MG PO TABS: 1.0000 | ORAL_TABLET | Freq: Two times a day (BID) | ORAL | 0 refills | Status: DC | PRN

## 2017-11-30 DIAGNOSIS — C259 Malignant neoplasm of pancreas, unspecified: Secondary | ICD-10-CM | POA: Diagnosis not present

## 2017-11-30 DIAGNOSIS — R1084 Generalized abdominal pain: Secondary | ICD-10-CM | POA: Diagnosis not present

## 2017-11-30 DIAGNOSIS — C786 Secondary malignant neoplasm of retroperitoneum and peritoneum: Secondary | ICD-10-CM | POA: Diagnosis not present

## 2017-11-30 DIAGNOSIS — C7800 Secondary malignant neoplasm of unspecified lung: Secondary | ICD-10-CM | POA: Diagnosis not present

## 2017-11-30 DIAGNOSIS — C787 Secondary malignant neoplasm of liver and intrahepatic bile duct: Secondary | ICD-10-CM | POA: Diagnosis not present

## 2017-11-30 DIAGNOSIS — R197 Diarrhea, unspecified: Secondary | ICD-10-CM | POA: Diagnosis not present

## 2017-12-04 ENCOUNTER — Inpatient Hospital Stay

## 2017-12-04 ENCOUNTER — Inpatient Hospital Stay (HOSPITAL_BASED_OUTPATIENT_CLINIC_OR_DEPARTMENT_OTHER): Admitting: Oncology

## 2017-12-04 VITALS — BP 107/68 | HR 56 | Temp 97.7°F | Resp 16 | Ht 71.0 in | Wt 143.4 lb

## 2017-12-04 DIAGNOSIS — R197 Diarrhea, unspecified: Secondary | ICD-10-CM | POA: Diagnosis not present

## 2017-12-04 DIAGNOSIS — C252 Malignant neoplasm of tail of pancreas: Secondary | ICD-10-CM | POA: Diagnosis not present

## 2017-12-04 DIAGNOSIS — R918 Other nonspecific abnormal finding of lung field: Secondary | ICD-10-CM

## 2017-12-04 DIAGNOSIS — C787 Secondary malignant neoplasm of liver and intrahepatic bile duct: Secondary | ICD-10-CM | POA: Diagnosis not present

## 2017-12-04 DIAGNOSIS — C259 Malignant neoplasm of pancreas, unspecified: Secondary | ICD-10-CM | POA: Diagnosis not present

## 2017-12-04 DIAGNOSIS — N281 Cyst of kidney, acquired: Secondary | ICD-10-CM

## 2017-12-04 DIAGNOSIS — R1900 Intra-abdominal and pelvic swelling, mass and lump, unspecified site: Secondary | ICD-10-CM

## 2017-12-04 DIAGNOSIS — Z9221 Personal history of antineoplastic chemotherapy: Secondary | ICD-10-CM | POA: Diagnosis not present

## 2017-12-04 DIAGNOSIS — C7800 Secondary malignant neoplasm of unspecified lung: Secondary | ICD-10-CM | POA: Diagnosis not present

## 2017-12-04 DIAGNOSIS — C786 Secondary malignant neoplasm of retroperitoneum and peritoneum: Secondary | ICD-10-CM | POA: Diagnosis not present

## 2017-12-04 DIAGNOSIS — R1084 Generalized abdominal pain: Secondary | ICD-10-CM | POA: Diagnosis not present

## 2017-12-04 DIAGNOSIS — C250 Malignant neoplasm of head of pancreas: Secondary | ICD-10-CM

## 2017-12-04 NOTE — Progress Notes (Signed)
  [place] OFFICE PROGRESS NOTE   Diagnosis: Pancreas cancer  INTERVAL HISTORY:   Jon Pena returns for a scheduled visit.  He returned from [place] a few weeks ago.  He reports undergoing treatment there with immunotherapy "talks", oxygenation therapy, vitamin C, and a low-dose of "chemotherapy ". He feels the treatment helped.  He lost a significant amount of weight while in [place], but reports gaining 10 pounds since returning home. He has enrolled in Hospice care.  He has decided to be placed on a no CODE BLUE status.  The hospice RN is visiting weekly.  He continues to have abdominal pain, relieved with oxycodone.  Objective:  Vital signs in last 24 hours:  Blood pressure 107/68, pulse (!) 56, temperature 97.7 F (36.5 C), temperature source Oral, resp. rate 16, height 5\' 11"  (1.803 m), weight 143 lb 6.4 oz (65 kg), SpO2 98 %.    Resp: Lungs clear bilaterally Cardio: Regular rate and rhythm GI: No hepatomegaly, no mass, nontender, 2 cm rounded mass at the umbilicus Vascular: Trace ankle edema bilaterally    Medications: I have reviewed the patient's current medications.   Assessment/Plan: 1. 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  Adjuvant gemcitabine 03/27/2013, 04/03/2013 and 04/10/2013.   Initiation of radiation and capecitabine 04/28/2013. Discontinued 05/22/2013 secondary to nausea.   Adjuvant gemcitabine resumed 06/17/2013. Last gemcitabine given 07/22/2013.  PET scan 01/22/2017-hypermetabolic soft tissue adjacent to the resection site, hypermetabolic adjacent satellite lesion, 2 areas of hypermetabolism in the liver  EUS biopsy of pancreas bed mass on 02/01/2017-adenocarcinoma  CT abdomen/pelvis 06/18/2017-partially calcified soft tissue mass to the left of the SMA is slightly larger with extension superior to the left of the celiac and to the left adrenal  gland, no new sites of disease. 1.6 cm left renal lesion has enlarged from 1.3 cm  CT abdomen/pelvis 10/09/2017- increased size of infiltrative retroperitoneal tumor, more conspicuous peritoneal nodules, new segment 3 liver lesion, stable left renal mass 2. Left renal cyst with a mural nodular component on MRI 02/20/2013. Indeterminate. 3. Acute appendicitis status post laparoscopic appendectomy 01/26/2013. 4. Port-A-Cath placement 03/21/2013, removed 08/26/2013. 5. Right mid to low back sebaceous cyst.  6. History of Rosacea rash over the face. 7. History of tobacco use  8. Screening chest CT 08/05/2013 revealed masslike and nodular opacities in the medial segment of the right middle lobe and in the inferior aspect of the lingula, followup chest CT 10/31/2013 revealed improvement 9. Cystic-appearing lesion in the body of the pancreas adjacent to the resection line noted on the CT 08/05/2013 and 10/31/2013-discussed with Dr. Barry Dienes, felt to represent postoperative change 10. "Pancreatitis "-managed by primary M.D. 11. Frequent bowel movements-likely secondary to pancreas insufficiency, he continues Creon, upper and lower endoscopies 10/15/2017-no explanation for the diarrhea, negative biopsies   Disposition: Jon Pena has a history of metastatic increase cancer.  He declines a trial of systemic chemotherapy.  He has participated in alternative care here and via a clinic in [place].  He is now enrolled in home hospice care.  I confirmed a no CODE BLUE status with him today.  He will continue the current narcotic regimen.  Jon Pena will return for an office visit in 6 weeks.  I am available to see him sooner as needed.   Betsy Coder, MD  12/04/2017  11:49 AM

## 2017-12-05 ENCOUNTER — Telehealth: Payer: Self-pay | Admitting: Oncology

## 2017-12-05 NOTE — Telephone Encounter (Signed)
Scheduled appt per 6/18 los - left message and sent reminder letter in the mail with appt date and time.

## 2017-12-07 DIAGNOSIS — C7800 Secondary malignant neoplasm of unspecified lung: Secondary | ICD-10-CM | POA: Diagnosis not present

## 2017-12-07 DIAGNOSIS — C786 Secondary malignant neoplasm of retroperitoneum and peritoneum: Secondary | ICD-10-CM | POA: Diagnosis not present

## 2017-12-07 DIAGNOSIS — C259 Malignant neoplasm of pancreas, unspecified: Secondary | ICD-10-CM | POA: Diagnosis not present

## 2017-12-07 DIAGNOSIS — R197 Diarrhea, unspecified: Secondary | ICD-10-CM | POA: Diagnosis not present

## 2017-12-07 DIAGNOSIS — R1084 Generalized abdominal pain: Secondary | ICD-10-CM | POA: Diagnosis not present

## 2017-12-07 DIAGNOSIS — C787 Secondary malignant neoplasm of liver and intrahepatic bile duct: Secondary | ICD-10-CM | POA: Diagnosis not present

## 2017-12-10 ENCOUNTER — Other Ambulatory Visit: Payer: Self-pay | Admitting: *Deleted

## 2017-12-10 DIAGNOSIS — C252 Malignant neoplasm of tail of pancreas: Secondary | ICD-10-CM

## 2017-12-10 MED ORDER — MELOXICAM 15 MG PO TABS: 15.0000 mg | ORAL_TABLET | Freq: Every day | ORAL | 1 refills | Status: DC

## 2017-12-11 DIAGNOSIS — R197 Diarrhea, unspecified: Secondary | ICD-10-CM | POA: Diagnosis not present

## 2017-12-11 DIAGNOSIS — C7800 Secondary malignant neoplasm of unspecified lung: Secondary | ICD-10-CM | POA: Diagnosis not present

## 2017-12-11 DIAGNOSIS — C786 Secondary malignant neoplasm of retroperitoneum and peritoneum: Secondary | ICD-10-CM | POA: Diagnosis not present

## 2017-12-11 DIAGNOSIS — C259 Malignant neoplasm of pancreas, unspecified: Secondary | ICD-10-CM | POA: Diagnosis not present

## 2017-12-11 DIAGNOSIS — C787 Secondary malignant neoplasm of liver and intrahepatic bile duct: Secondary | ICD-10-CM | POA: Diagnosis not present

## 2017-12-11 DIAGNOSIS — R1084 Generalized abdominal pain: Secondary | ICD-10-CM | POA: Diagnosis not present

## 2017-12-14 DIAGNOSIS — C786 Secondary malignant neoplasm of retroperitoneum and peritoneum: Secondary | ICD-10-CM | POA: Diagnosis not present

## 2017-12-14 DIAGNOSIS — C787 Secondary malignant neoplasm of liver and intrahepatic bile duct: Secondary | ICD-10-CM | POA: Diagnosis not present

## 2017-12-14 DIAGNOSIS — C7800 Secondary malignant neoplasm of unspecified lung: Secondary | ICD-10-CM | POA: Diagnosis not present

## 2017-12-14 DIAGNOSIS — R197 Diarrhea, unspecified: Secondary | ICD-10-CM | POA: Diagnosis not present

## 2017-12-14 DIAGNOSIS — C259 Malignant neoplasm of pancreas, unspecified: Secondary | ICD-10-CM | POA: Diagnosis not present

## 2017-12-14 DIAGNOSIS — R1084 Generalized abdominal pain: Secondary | ICD-10-CM | POA: Diagnosis not present

## 2017-12-17 DIAGNOSIS — C787 Secondary malignant neoplasm of liver and intrahepatic bile duct: Secondary | ICD-10-CM | POA: Diagnosis not present

## 2017-12-17 DIAGNOSIS — R110 Nausea: Secondary | ICD-10-CM | POA: Diagnosis not present

## 2017-12-17 DIAGNOSIS — C786 Secondary malignant neoplasm of retroperitoneum and peritoneum: Secondary | ICD-10-CM | POA: Diagnosis not present

## 2017-12-17 DIAGNOSIS — E785 Hyperlipidemia, unspecified: Secondary | ICD-10-CM | POA: Diagnosis not present

## 2017-12-17 DIAGNOSIS — C259 Malignant neoplasm of pancreas, unspecified: Secondary | ICD-10-CM | POA: Diagnosis not present

## 2017-12-17 DIAGNOSIS — L719 Rosacea, unspecified: Secondary | ICD-10-CM | POA: Diagnosis not present

## 2017-12-17 DIAGNOSIS — R1084 Generalized abdominal pain: Secondary | ICD-10-CM | POA: Diagnosis not present

## 2017-12-17 DIAGNOSIS — R197 Diarrhea, unspecified: Secondary | ICD-10-CM | POA: Diagnosis not present

## 2017-12-17 DIAGNOSIS — C7800 Secondary malignant neoplasm of unspecified lung: Secondary | ICD-10-CM | POA: Diagnosis not present

## 2017-12-18 DIAGNOSIS — C259 Malignant neoplasm of pancreas, unspecified: Secondary | ICD-10-CM | POA: Diagnosis not present

## 2017-12-18 DIAGNOSIS — C786 Secondary malignant neoplasm of retroperitoneum and peritoneum: Secondary | ICD-10-CM | POA: Diagnosis not present

## 2017-12-18 DIAGNOSIS — R1084 Generalized abdominal pain: Secondary | ICD-10-CM | POA: Diagnosis not present

## 2017-12-18 DIAGNOSIS — C787 Secondary malignant neoplasm of liver and intrahepatic bile duct: Secondary | ICD-10-CM | POA: Diagnosis not present

## 2017-12-18 DIAGNOSIS — C7800 Secondary malignant neoplasm of unspecified lung: Secondary | ICD-10-CM | POA: Diagnosis not present

## 2017-12-18 DIAGNOSIS — R197 Diarrhea, unspecified: Secondary | ICD-10-CM | POA: Diagnosis not present

## 2017-12-19 ENCOUNTER — Other Ambulatory Visit: Payer: Self-pay | Admitting: *Deleted

## 2017-12-19 DIAGNOSIS — C252 Malignant neoplasm of tail of pancreas: Secondary | ICD-10-CM

## 2017-12-19 MED ORDER — HYDROCODONE-ACETAMINOPHEN 5-325 MG PO TABS: ORAL_TABLET | ORAL | 0 refills | Status: DC

## 2017-12-20 DIAGNOSIS — C7800 Secondary malignant neoplasm of unspecified lung: Secondary | ICD-10-CM | POA: Diagnosis not present

## 2017-12-20 DIAGNOSIS — C259 Malignant neoplasm of pancreas, unspecified: Secondary | ICD-10-CM | POA: Diagnosis not present

## 2017-12-20 DIAGNOSIS — R197 Diarrhea, unspecified: Secondary | ICD-10-CM | POA: Diagnosis not present

## 2017-12-20 DIAGNOSIS — R1084 Generalized abdominal pain: Secondary | ICD-10-CM | POA: Diagnosis not present

## 2017-12-20 DIAGNOSIS — C787 Secondary malignant neoplasm of liver and intrahepatic bile duct: Secondary | ICD-10-CM | POA: Diagnosis not present

## 2017-12-20 DIAGNOSIS — C786 Secondary malignant neoplasm of retroperitoneum and peritoneum: Secondary | ICD-10-CM | POA: Diagnosis not present

## 2017-12-21 ENCOUNTER — Other Ambulatory Visit: Payer: Self-pay | Admitting: *Deleted

## 2017-12-21 DIAGNOSIS — C250 Malignant neoplasm of head of pancreas: Secondary | ICD-10-CM

## 2017-12-21 MED ORDER — OXYCODONE-ACETAMINOPHEN 5-325 MG PO TABS: 1.0000 | ORAL_TABLET | Freq: Two times a day (BID) | ORAL | 0 refills | Status: DC | PRN

## 2017-12-24 DIAGNOSIS — C7800 Secondary malignant neoplasm of unspecified lung: Secondary | ICD-10-CM | POA: Diagnosis not present

## 2017-12-24 DIAGNOSIS — C787 Secondary malignant neoplasm of liver and intrahepatic bile duct: Secondary | ICD-10-CM | POA: Diagnosis not present

## 2017-12-24 DIAGNOSIS — R1084 Generalized abdominal pain: Secondary | ICD-10-CM | POA: Diagnosis not present

## 2017-12-24 DIAGNOSIS — C786 Secondary malignant neoplasm of retroperitoneum and peritoneum: Secondary | ICD-10-CM | POA: Diagnosis not present

## 2017-12-24 DIAGNOSIS — C259 Malignant neoplasm of pancreas, unspecified: Secondary | ICD-10-CM | POA: Diagnosis not present

## 2017-12-24 DIAGNOSIS — R197 Diarrhea, unspecified: Secondary | ICD-10-CM | POA: Diagnosis not present

## 2017-12-28 DIAGNOSIS — R197 Diarrhea, unspecified: Secondary | ICD-10-CM | POA: Diagnosis not present

## 2017-12-28 DIAGNOSIS — C259 Malignant neoplasm of pancreas, unspecified: Secondary | ICD-10-CM | POA: Diagnosis not present

## 2017-12-28 DIAGNOSIS — C7800 Secondary malignant neoplasm of unspecified lung: Secondary | ICD-10-CM | POA: Diagnosis not present

## 2017-12-28 DIAGNOSIS — C787 Secondary malignant neoplasm of liver and intrahepatic bile duct: Secondary | ICD-10-CM | POA: Diagnosis not present

## 2017-12-28 DIAGNOSIS — C786 Secondary malignant neoplasm of retroperitoneum and peritoneum: Secondary | ICD-10-CM | POA: Diagnosis not present

## 2017-12-28 DIAGNOSIS — R1084 Generalized abdominal pain: Secondary | ICD-10-CM | POA: Diagnosis not present

## 2018-01-01 DIAGNOSIS — R1084 Generalized abdominal pain: Secondary | ICD-10-CM | POA: Diagnosis not present

## 2018-01-01 DIAGNOSIS — C786 Secondary malignant neoplasm of retroperitoneum and peritoneum: Secondary | ICD-10-CM | POA: Diagnosis not present

## 2018-01-01 DIAGNOSIS — C7800 Secondary malignant neoplasm of unspecified lung: Secondary | ICD-10-CM | POA: Diagnosis not present

## 2018-01-01 DIAGNOSIS — C259 Malignant neoplasm of pancreas, unspecified: Secondary | ICD-10-CM | POA: Diagnosis not present

## 2018-01-01 DIAGNOSIS — C787 Secondary malignant neoplasm of liver and intrahepatic bile duct: Secondary | ICD-10-CM | POA: Diagnosis not present

## 2018-01-01 DIAGNOSIS — R197 Diarrhea, unspecified: Secondary | ICD-10-CM | POA: Diagnosis not present

## 2018-01-04 DIAGNOSIS — R1084 Generalized abdominal pain: Secondary | ICD-10-CM | POA: Diagnosis not present

## 2018-01-04 DIAGNOSIS — C259 Malignant neoplasm of pancreas, unspecified: Secondary | ICD-10-CM | POA: Diagnosis not present

## 2018-01-04 DIAGNOSIS — R197 Diarrhea, unspecified: Secondary | ICD-10-CM | POA: Diagnosis not present

## 2018-01-04 DIAGNOSIS — C786 Secondary malignant neoplasm of retroperitoneum and peritoneum: Secondary | ICD-10-CM | POA: Diagnosis not present

## 2018-01-04 DIAGNOSIS — C787 Secondary malignant neoplasm of liver and intrahepatic bile duct: Secondary | ICD-10-CM | POA: Diagnosis not present

## 2018-01-04 DIAGNOSIS — C7800 Secondary malignant neoplasm of unspecified lung: Secondary | ICD-10-CM | POA: Diagnosis not present

## 2018-01-08 ENCOUNTER — Other Ambulatory Visit: Payer: Self-pay | Admitting: *Deleted

## 2018-01-08 DIAGNOSIS — C786 Secondary malignant neoplasm of retroperitoneum and peritoneum: Secondary | ICD-10-CM | POA: Diagnosis not present

## 2018-01-08 DIAGNOSIS — C259 Malignant neoplasm of pancreas, unspecified: Secondary | ICD-10-CM | POA: Diagnosis not present

## 2018-01-08 DIAGNOSIS — R1084 Generalized abdominal pain: Secondary | ICD-10-CM | POA: Diagnosis not present

## 2018-01-08 DIAGNOSIS — C7800 Secondary malignant neoplasm of unspecified lung: Secondary | ICD-10-CM | POA: Diagnosis not present

## 2018-01-08 DIAGNOSIS — C787 Secondary malignant neoplasm of liver and intrahepatic bile duct: Secondary | ICD-10-CM | POA: Diagnosis not present

## 2018-01-08 DIAGNOSIS — C250 Malignant neoplasm of head of pancreas: Secondary | ICD-10-CM

## 2018-01-08 DIAGNOSIS — R197 Diarrhea, unspecified: Secondary | ICD-10-CM | POA: Diagnosis not present

## 2018-01-08 MED ORDER — GABAPENTIN 100 MG PO CAPS: 100.0000 mg | ORAL_CAPSULE | Freq: Three times a day (TID) | ORAL | 0 refills | Status: AC

## 2018-01-08 MED ORDER — OXYCODONE-ACETAMINOPHEN 5-325 MG PO TABS: 1.0000 | ORAL_TABLET | Freq: Two times a day (BID) | ORAL | 0 refills | Status: DC | PRN

## 2018-01-15 ENCOUNTER — Telehealth: Payer: Self-pay | Admitting: Oncology

## 2018-01-15 ENCOUNTER — Inpatient Hospital Stay: Payer: Medicare Other

## 2018-01-15 ENCOUNTER — Inpatient Hospital Stay: Payer: Medicare Other | Attending: Oncology | Admitting: Oncology

## 2018-01-15 VITALS — BP 136/78 | HR 57 | Temp 98.5°F | Resp 17 | Ht 71.0 in | Wt 143.4 lb

## 2018-01-15 DIAGNOSIS — C252 Malignant neoplasm of tail of pancreas: Secondary | ICD-10-CM | POA: Diagnosis not present

## 2018-01-15 DIAGNOSIS — R1900 Intra-abdominal and pelvic swelling, mass and lump, unspecified site: Secondary | ICD-10-CM | POA: Diagnosis not present

## 2018-01-15 DIAGNOSIS — G893 Neoplasm related pain (acute) (chronic): Secondary | ICD-10-CM | POA: Insufficient documentation

## 2018-01-15 DIAGNOSIS — R918 Other nonspecific abnormal finding of lung field: Secondary | ICD-10-CM | POA: Diagnosis not present

## 2018-01-15 DIAGNOSIS — C259 Malignant neoplasm of pancreas, unspecified: Secondary | ICD-10-CM | POA: Diagnosis not present

## 2018-01-15 DIAGNOSIS — R1084 Generalized abdominal pain: Secondary | ICD-10-CM | POA: Diagnosis not present

## 2018-01-15 DIAGNOSIS — K5900 Constipation, unspecified: Secondary | ICD-10-CM | POA: Diagnosis not present

## 2018-01-15 DIAGNOSIS — C786 Secondary malignant neoplasm of retroperitoneum and peritoneum: Secondary | ICD-10-CM | POA: Diagnosis not present

## 2018-01-15 DIAGNOSIS — Z90411 Acquired partial absence of pancreas: Secondary | ICD-10-CM | POA: Diagnosis not present

## 2018-01-15 DIAGNOSIS — R197 Diarrhea, unspecified: Secondary | ICD-10-CM | POA: Insufficient documentation

## 2018-01-15 DIAGNOSIS — C7800 Secondary malignant neoplasm of unspecified lung: Secondary | ICD-10-CM | POA: Diagnosis not present

## 2018-01-15 DIAGNOSIS — C250 Malignant neoplasm of head of pancreas: Secondary | ICD-10-CM

## 2018-01-15 DIAGNOSIS — N281 Cyst of kidney, acquired: Secondary | ICD-10-CM | POA: Insufficient documentation

## 2018-01-15 DIAGNOSIS — F17210 Nicotine dependence, cigarettes, uncomplicated: Secondary | ICD-10-CM | POA: Diagnosis not present

## 2018-01-15 DIAGNOSIS — Z9081 Acquired absence of spleen: Secondary | ICD-10-CM | POA: Insufficient documentation

## 2018-01-15 DIAGNOSIS — L723 Sebaceous cyst: Secondary | ICD-10-CM | POA: Diagnosis not present

## 2018-01-15 DIAGNOSIS — C787 Secondary malignant neoplasm of liver and intrahepatic bile duct: Secondary | ICD-10-CM | POA: Diagnosis not present

## 2018-01-15 MED ORDER — OXYCODONE-ACETAMINOPHEN 5-325 MG PO TABS: 1.0000 | ORAL_TABLET | ORAL | 0 refills | Status: DC | PRN

## 2018-01-15 NOTE — Telephone Encounter (Signed)
Gave pt avs and calendar with appts per 7/30 los °

## 2018-01-15 NOTE — Addendum Note (Signed)
Addended by: Mathis Fare on: 01/15/2018 11:27 AM   Modules accepted: Orders

## 2018-01-15 NOTE — Progress Notes (Signed)
[place] OFFICE PROGRESS NOTE   Diagnosis: Pancreas cancer  INTERVAL HISTORY:   Jon Pena returns as scheduled.  He is enrolled in home hospice care.  He continues to have pain at the left abdomen.  He also has pain at the umbilicus.  He has developed increasing nodularity at the umbilicus.  He feels this is related to a previous hernia surgery.  He takes hydrocodone and oxycodone for pain.  He reports diarrhea and constipation.  He uses enemas for constipation.  His appetite has improved.  Objective:  Vital signs in last 24 hours:  Blood pressure 136/78, pulse (!) 57, temperature 98.5 F (36.9 C), temperature source Oral, resp. rate 17, height 5\' 11"  (1.803 m), weight 143 lb 6.4 oz (65 kg), SpO2 97 %.    Resp: Lungs clear bilaterally Cardio: Regular rate and rhythm GI: No hepatomegaly, 2.5 cm nodular mass at the umbilicus Vascular: No leg edema      Lab Results:  Lab Results  Component Value Date   WBC 8.3 10/08/2017   HGB 14.2 10/08/2017   HCT 42.0 10/08/2017   MCV 95.2 10/08/2017   PLT 280 10/08/2017   NEUTROABS 4.7 10/08/2017    CMP  Lab Results  Component Value Date   NA 138 10/08/2017   K 4.3 10/08/2017   CL 105 10/08/2017   CO2 24 10/08/2017   GLUCOSE 145 (H) 10/08/2017   BUN 25 10/08/2017   CREATININE 1.07 10/08/2017   CALCIUM 9.4 10/08/2017   PROT 6.8 10/08/2017   ALBUMIN 3.9 10/08/2017   AST 15 10/08/2017   ALT 17 10/08/2017   ALKPHOS 89 10/08/2017   BILITOT 0.5 10/08/2017   GFRNONAA >60 10/08/2017   GFRAA >60 10/08/2017    No results found for: CEA1  Lab Results  Component Value Date   INR 0.95 02/25/2013    Imaging:  No results found.  Medications: I have reviewed the patient's current medications.   Assessment/Plan: 1. 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  Adjuvant gemcitabine 03/27/2013, 04/03/2013 and 04/10/2013.    Initiation of radiation and capecitabine 04/28/2013. Discontinued 05/22/2013 secondary to nausea.   Adjuvant gemcitabine resumed 06/17/2013. Last gemcitabine given 07/22/2013.  PET scan 01/22/2017-hypermetabolic soft tissue adjacent to the resection site, hypermetabolic adjacent satellite lesion, 2 areas of hypermetabolism in the liver  EUS biopsy of pancreas bed mass on 02/01/2017-adenocarcinoma  CT abdomen/pelvis 06/18/2017-partially calcified soft tissue mass to the left of the SMA is slightly larger with extension superior to the left of the celiac and to the left adrenal gland, no new sites of disease. 1.6 cm left renal lesion has enlarged from 1.3 cm  CT abdomen/pelvis 10/09/2017-increased size of infiltrative retroperitoneal tumor, more conspicuous peritoneal nodules, new segment 3 liver lesion, stable left renal mass 2. Left renal cyst with a mural nodular component on MRI 02/20/2013. Indeterminate. 3. Acute appendicitis status post laparoscopic appendectomy 01/26/2013. 4. Port-A-Cath placement 03/21/2013, removed 08/26/2013. 5. Right mid to low back sebaceous cyst.  6. History of Rosacea rash over the face. 7. History of tobacco use  8. Screening chest CT 08/05/2013 revealed masslike and nodular opacities in the medial segment of the right middle lobe and in the inferior aspect of the lingula, followup chest CT 10/31/2013 revealed improvement 9. Cystic-appearing lesion in the body of the pancreas adjacent to the resection line noted on the CT 08/05/2013 and 10/31/2013-discussed with Dr. Barry Dienes, felt to represent postoperative change 10. "Pancreatitis "-  managed by primary M.D. 11. Frequent bowel movements-likely secondary to pancreas insufficiency, he continues Creon, upper and lower endoscopies 10/15/2017-no explanation for the diarrhea, negative biopsies   Disposition: Mr. Pender appears unchanged.  He has a nodular mass at the umbilicus.  I think it is very likely this is  secondary to metastatic pancreas cancer.  Mr. Hobbins stated again that he does not wish to consider chemotherapy. He will continue hydrocodone and oxycodone for pain.  I recommended he not drive while taking pain medication.  Mr. Comas requested we check the CA 19-9 today.  He will return for an office visit in 4 weeks.  We are available to see him in the interim as needed.  15 minutes were spent with the patient today.  The majority of the time was used for counseling and coordination of care.  Betsy Coder, MD  01/15/2018  10:30 AM

## 2018-01-16 ENCOUNTER — Telehealth: Payer: Self-pay | Admitting: Emergency Medicine

## 2018-01-16 LAB — CANCER ANTIGEN 19-9: CA 19-9: <2 U/mL (ref 0–35)

## 2018-01-16 NOTE — Telephone Encounter (Addendum)
Pt verbalized understanding   ----- Message from Ladell Pier, MD sent at 01/16/2018  6:54 AM EDT ----- Please call patient, ca19-9 is normal

## 2018-01-17 DIAGNOSIS — E785 Hyperlipidemia, unspecified: Secondary | ICD-10-CM | POA: Diagnosis not present

## 2018-01-17 DIAGNOSIS — C787 Secondary malignant neoplasm of liver and intrahepatic bile duct: Secondary | ICD-10-CM | POA: Diagnosis not present

## 2018-01-17 DIAGNOSIS — R110 Nausea: Secondary | ICD-10-CM | POA: Diagnosis not present

## 2018-01-17 DIAGNOSIS — L719 Rosacea, unspecified: Secondary | ICD-10-CM | POA: Diagnosis not present

## 2018-01-17 DIAGNOSIS — C7800 Secondary malignant neoplasm of unspecified lung: Secondary | ICD-10-CM | POA: Diagnosis not present

## 2018-01-17 DIAGNOSIS — C786 Secondary malignant neoplasm of retroperitoneum and peritoneum: Secondary | ICD-10-CM | POA: Diagnosis not present

## 2018-01-17 DIAGNOSIS — C259 Malignant neoplasm of pancreas, unspecified: Secondary | ICD-10-CM | POA: Diagnosis not present

## 2018-01-17 DIAGNOSIS — R197 Diarrhea, unspecified: Secondary | ICD-10-CM | POA: Diagnosis not present

## 2018-01-17 DIAGNOSIS — R1084 Generalized abdominal pain: Secondary | ICD-10-CM | POA: Diagnosis not present

## 2018-01-18 DIAGNOSIS — C7800 Secondary malignant neoplasm of unspecified lung: Secondary | ICD-10-CM | POA: Diagnosis not present

## 2018-01-18 DIAGNOSIS — R197 Diarrhea, unspecified: Secondary | ICD-10-CM | POA: Diagnosis not present

## 2018-01-18 DIAGNOSIS — R1084 Generalized abdominal pain: Secondary | ICD-10-CM | POA: Diagnosis not present

## 2018-01-18 DIAGNOSIS — C786 Secondary malignant neoplasm of retroperitoneum and peritoneum: Secondary | ICD-10-CM | POA: Diagnosis not present

## 2018-01-18 DIAGNOSIS — C787 Secondary malignant neoplasm of liver and intrahepatic bile duct: Secondary | ICD-10-CM | POA: Diagnosis not present

## 2018-01-18 DIAGNOSIS — C259 Malignant neoplasm of pancreas, unspecified: Secondary | ICD-10-CM | POA: Diagnosis not present

## 2018-01-22 DIAGNOSIS — R197 Diarrhea, unspecified: Secondary | ICD-10-CM | POA: Diagnosis not present

## 2018-01-22 DIAGNOSIS — C7800 Secondary malignant neoplasm of unspecified lung: Secondary | ICD-10-CM | POA: Diagnosis not present

## 2018-01-22 DIAGNOSIS — C787 Secondary malignant neoplasm of liver and intrahepatic bile duct: Secondary | ICD-10-CM | POA: Diagnosis not present

## 2018-01-22 DIAGNOSIS — C786 Secondary malignant neoplasm of retroperitoneum and peritoneum: Secondary | ICD-10-CM | POA: Diagnosis not present

## 2018-01-22 DIAGNOSIS — R1084 Generalized abdominal pain: Secondary | ICD-10-CM | POA: Diagnosis not present

## 2018-01-22 DIAGNOSIS — C259 Malignant neoplasm of pancreas, unspecified: Secondary | ICD-10-CM | POA: Diagnosis not present

## 2018-01-25 DIAGNOSIS — R1084 Generalized abdominal pain: Secondary | ICD-10-CM | POA: Diagnosis not present

## 2018-01-25 DIAGNOSIS — C786 Secondary malignant neoplasm of retroperitoneum and peritoneum: Secondary | ICD-10-CM | POA: Diagnosis not present

## 2018-01-25 DIAGNOSIS — C7800 Secondary malignant neoplasm of unspecified lung: Secondary | ICD-10-CM | POA: Diagnosis not present

## 2018-01-25 DIAGNOSIS — C259 Malignant neoplasm of pancreas, unspecified: Secondary | ICD-10-CM | POA: Diagnosis not present

## 2018-01-25 DIAGNOSIS — R197 Diarrhea, unspecified: Secondary | ICD-10-CM | POA: Diagnosis not present

## 2018-01-25 DIAGNOSIS — C787 Secondary malignant neoplasm of liver and intrahepatic bile duct: Secondary | ICD-10-CM | POA: Diagnosis not present

## 2018-01-28 DIAGNOSIS — C787 Secondary malignant neoplasm of liver and intrahepatic bile duct: Secondary | ICD-10-CM | POA: Diagnosis not present

## 2018-01-28 DIAGNOSIS — C7800 Secondary malignant neoplasm of unspecified lung: Secondary | ICD-10-CM | POA: Diagnosis not present

## 2018-01-28 DIAGNOSIS — R1084 Generalized abdominal pain: Secondary | ICD-10-CM | POA: Diagnosis not present

## 2018-01-28 DIAGNOSIS — R197 Diarrhea, unspecified: Secondary | ICD-10-CM | POA: Diagnosis not present

## 2018-01-28 DIAGNOSIS — C786 Secondary malignant neoplasm of retroperitoneum and peritoneum: Secondary | ICD-10-CM | POA: Diagnosis not present

## 2018-01-28 DIAGNOSIS — C259 Malignant neoplasm of pancreas, unspecified: Secondary | ICD-10-CM | POA: Diagnosis not present

## 2018-01-30 DIAGNOSIS — R1084 Generalized abdominal pain: Secondary | ICD-10-CM | POA: Diagnosis not present

## 2018-01-30 DIAGNOSIS — R197 Diarrhea, unspecified: Secondary | ICD-10-CM | POA: Diagnosis not present

## 2018-01-30 DIAGNOSIS — C7800 Secondary malignant neoplasm of unspecified lung: Secondary | ICD-10-CM | POA: Diagnosis not present

## 2018-01-30 DIAGNOSIS — C787 Secondary malignant neoplasm of liver and intrahepatic bile duct: Secondary | ICD-10-CM | POA: Diagnosis not present

## 2018-01-30 DIAGNOSIS — C786 Secondary malignant neoplasm of retroperitoneum and peritoneum: Secondary | ICD-10-CM | POA: Diagnosis not present

## 2018-01-30 DIAGNOSIS — C259 Malignant neoplasm of pancreas, unspecified: Secondary | ICD-10-CM | POA: Diagnosis not present

## 2018-01-31 ENCOUNTER — Telehealth: Payer: Self-pay

## 2018-01-31 DIAGNOSIS — C7800 Secondary malignant neoplasm of unspecified lung: Secondary | ICD-10-CM | POA: Diagnosis not present

## 2018-01-31 DIAGNOSIS — R1084 Generalized abdominal pain: Secondary | ICD-10-CM | POA: Diagnosis not present

## 2018-01-31 DIAGNOSIS — C252 Malignant neoplasm of tail of pancreas: Secondary | ICD-10-CM

## 2018-01-31 DIAGNOSIS — C259 Malignant neoplasm of pancreas, unspecified: Secondary | ICD-10-CM | POA: Diagnosis not present

## 2018-01-31 DIAGNOSIS — R197 Diarrhea, unspecified: Secondary | ICD-10-CM | POA: Diagnosis not present

## 2018-01-31 DIAGNOSIS — C787 Secondary malignant neoplasm of liver and intrahepatic bile duct: Secondary | ICD-10-CM | POA: Diagnosis not present

## 2018-01-31 DIAGNOSIS — C786 Secondary malignant neoplasm of retroperitoneum and peritoneum: Secondary | ICD-10-CM | POA: Diagnosis not present

## 2018-01-31 NOTE — Telephone Encounter (Signed)
Returned call to [place] with [facility]. Jon Pena reports that pt is experiencing urinary frequency and dysuria. Requesting antibiotic prophylaxis for UTI. Per Dr. Benay Spice, pt to come to clinic for UA/culture. Spoke with pt and pt agreeable. Will report to clinic tomorrow for labs. Made MD aware

## 2018-02-01 ENCOUNTER — Telehealth: Payer: Self-pay

## 2018-02-01 ENCOUNTER — Inpatient Hospital Stay: Attending: Oncology

## 2018-02-01 DIAGNOSIS — R634 Abnormal weight loss: Secondary | ICD-10-CM | POA: Insufficient documentation

## 2018-02-01 DIAGNOSIS — Z87891 Personal history of nicotine dependence: Secondary | ICD-10-CM | POA: Diagnosis not present

## 2018-02-01 DIAGNOSIS — K4090 Unilateral inguinal hernia, without obstruction or gangrene, not specified as recurrent: Secondary | ICD-10-CM | POA: Insufficient documentation

## 2018-02-01 DIAGNOSIS — N281 Cyst of kidney, acquired: Secondary | ICD-10-CM | POA: Insufficient documentation

## 2018-02-01 DIAGNOSIS — C252 Malignant neoplasm of tail of pancreas: Secondary | ICD-10-CM | POA: Diagnosis not present

## 2018-02-01 DIAGNOSIS — G893 Neoplasm related pain (acute) (chronic): Secondary | ICD-10-CM | POA: Insufficient documentation

## 2018-02-01 DIAGNOSIS — R300 Dysuria: Secondary | ICD-10-CM | POA: Diagnosis not present

## 2018-02-01 LAB — URINALYSIS, COMPLETE (UACMP) WITH MICROSCOPIC
Bilirubin Urine: NEGATIVE
Glucose, UA: NEGATIVE mg/dL
Ketones, ur: NEGATIVE mg/dL
Nitrite: NEGATIVE
PH: 5 (ref 5.0–8.0)
Protein, ur: 30 mg/dL — AB
SPECIFIC GRAVITY, URINE: 1.024 (ref 1.005–1.030)

## 2018-02-01 MED ORDER — CIPROFLOXACIN HCL 500 MG PO TABS: 500.0000 mg | ORAL_TABLET | Freq: Two times a day (BID) | ORAL | 0 refills | Status: AC

## 2018-02-01 NOTE — Telephone Encounter (Addendum)
Pt voiced understanding  ----- Message from Ladell Pier, MD sent at 02/01/2018 11:28 AM EDT ----- Please call patient, urinalysis is consistent with a urinary tract infection, prescribed ciprofloxacin 500 mg twice daily for 3 days, we will follow-up on urine culture

## 2018-02-02 LAB — URINE CULTURE: Culture: NO GROWTH

## 2018-02-04 ENCOUNTER — Telehealth: Payer: Self-pay

## 2018-02-04 DIAGNOSIS — R1084 Generalized abdominal pain: Secondary | ICD-10-CM | POA: Diagnosis not present

## 2018-02-04 DIAGNOSIS — C787 Secondary malignant neoplasm of liver and intrahepatic bile duct: Secondary | ICD-10-CM | POA: Diagnosis not present

## 2018-02-04 DIAGNOSIS — C7800 Secondary malignant neoplasm of unspecified lung: Secondary | ICD-10-CM | POA: Diagnosis not present

## 2018-02-04 DIAGNOSIS — R197 Diarrhea, unspecified: Secondary | ICD-10-CM | POA: Diagnosis not present

## 2018-02-04 DIAGNOSIS — C259 Malignant neoplasm of pancreas, unspecified: Secondary | ICD-10-CM | POA: Diagnosis not present

## 2018-02-04 DIAGNOSIS — C786 Secondary malignant neoplasm of retroperitoneum and peritoneum: Secondary | ICD-10-CM | POA: Diagnosis not present

## 2018-02-04 NOTE — Telephone Encounter (Signed)
Returned call to [place] from [facility] re: symptoms. Per Izora Gala, pt reports swelling in L testicle and an area above his liver. Per Dr. Benay Spice, ok for Dr. Lyman Speller with Hospice to assess pt. Izora Gala reports that Dr. Lyman Speller states that pt needs to be seen by Dr. Benay Spice. This RN voiced understanding. Pt has appt in office next Tuesday. Izora Gala to call and alert pt and call back if pt needs to be seen sooner.

## 2018-02-07 DIAGNOSIS — C787 Secondary malignant neoplasm of liver and intrahepatic bile duct: Secondary | ICD-10-CM | POA: Diagnosis not present

## 2018-02-07 DIAGNOSIS — R1084 Generalized abdominal pain: Secondary | ICD-10-CM | POA: Diagnosis not present

## 2018-02-07 DIAGNOSIS — C7800 Secondary malignant neoplasm of unspecified lung: Secondary | ICD-10-CM | POA: Diagnosis not present

## 2018-02-07 DIAGNOSIS — R197 Diarrhea, unspecified: Secondary | ICD-10-CM | POA: Diagnosis not present

## 2018-02-07 DIAGNOSIS — C786 Secondary malignant neoplasm of retroperitoneum and peritoneum: Secondary | ICD-10-CM | POA: Diagnosis not present

## 2018-02-07 DIAGNOSIS — C259 Malignant neoplasm of pancreas, unspecified: Secondary | ICD-10-CM | POA: Diagnosis not present

## 2018-02-11 DIAGNOSIS — R1084 Generalized abdominal pain: Secondary | ICD-10-CM | POA: Diagnosis not present

## 2018-02-11 DIAGNOSIS — C786 Secondary malignant neoplasm of retroperitoneum and peritoneum: Secondary | ICD-10-CM | POA: Diagnosis not present

## 2018-02-11 DIAGNOSIS — R197 Diarrhea, unspecified: Secondary | ICD-10-CM | POA: Diagnosis not present

## 2018-02-11 DIAGNOSIS — C787 Secondary malignant neoplasm of liver and intrahepatic bile duct: Secondary | ICD-10-CM | POA: Diagnosis not present

## 2018-02-11 DIAGNOSIS — C259 Malignant neoplasm of pancreas, unspecified: Secondary | ICD-10-CM | POA: Diagnosis not present

## 2018-02-11 DIAGNOSIS — C7800 Secondary malignant neoplasm of unspecified lung: Secondary | ICD-10-CM | POA: Diagnosis not present

## 2018-02-12 ENCOUNTER — Inpatient Hospital Stay (HOSPITAL_BASED_OUTPATIENT_CLINIC_OR_DEPARTMENT_OTHER): Admitting: Oncology

## 2018-02-12 ENCOUNTER — Inpatient Hospital Stay

## 2018-02-12 VITALS — BP 122/82 | HR 54 | Temp 98.6°F | Resp 18 | Ht 71.0 in | Wt 129.6 lb

## 2018-02-12 DIAGNOSIS — Z87891 Personal history of nicotine dependence: Secondary | ICD-10-CM

## 2018-02-12 DIAGNOSIS — K4090 Unilateral inguinal hernia, without obstruction or gangrene, not specified as recurrent: Secondary | ICD-10-CM | POA: Diagnosis not present

## 2018-02-12 DIAGNOSIS — N281 Cyst of kidney, acquired: Secondary | ICD-10-CM

## 2018-02-12 DIAGNOSIS — G893 Neoplasm related pain (acute) (chronic): Secondary | ICD-10-CM | POA: Diagnosis not present

## 2018-02-12 DIAGNOSIS — C252 Malignant neoplasm of tail of pancreas: Secondary | ICD-10-CM | POA: Diagnosis not present

## 2018-02-12 DIAGNOSIS — R634 Abnormal weight loss: Secondary | ICD-10-CM

## 2018-02-12 DIAGNOSIS — R300 Dysuria: Secondary | ICD-10-CM

## 2018-02-12 NOTE — Progress Notes (Signed)
[place] OFFICE PROGRESS NOTE   Diagnosis: Pancreas cancer  INTERVAL HISTORY:   Jon Pena returns as scheduled.  He continues to have abdominal pain.  He is taking 16 oxycodone tablets per day for relief of pain.  He complained of dysuria a few weeks ago.  A urinalysis revealed red cells and white cells.  A culture returned negative.  He completed a course of ciprofloxacin.  Dysuria has improved. He complains of enlargement of the "testicle "with discomfort when he changes position. He continues follow-up with the [facility] program.  Objective:  Vital signs in last 24 hours:  Blood pressure 122/82, pulse (!) 54, temperature 98.6 F (37 C), temperature source Oral, resp. rate 18, height 5\' 11"  (1.803 m), weight 129 lb 9.6 oz (58.8 kg), SpO2 99 %.     Resp: Lungs clear bilaterally Cardio: Regular rate and rhythm GI: No hepatomegaly, massl mellitus Vascular: No leg edema GU: There is slight enlargement of the left compared to the right testicle with associated tenderness.  There is no discrete mass in the left testicle.  There is soft fullness in the left lower inguinal region in the upper part of the left scrotum.    Medications: I have reviewed the patient's current medications.   Assessment/Plan: 1. 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  Adjuvant gemcitabine 03/27/2013, 04/03/2013 and 04/10/2013.   Initiation of radiation and capecitabine 04/28/2013. Discontinued 05/22/2013 secondary to nausea.   Adjuvant gemcitabine resumed 06/17/2013. Last gemcitabine given 07/22/2013.  PET scan 01/22/2017-hypermetabolic soft tissue adjacent to the resection site, hypermetabolic adjacent satellite lesion, 2 areas of hypermetabolism in the liver  EUS biopsy of pancreas bed mass on 02/01/2017-adenocarcinoma  CT abdomen/pelvis 06/18/2017-partially calcified soft tissue mass  to the left of the SMA is slightly larger with extension superior to the left of the celiac and to the left adrenal gland, no new sites of disease. 1.6 cm left renal lesion has enlarged from 1.3 cm  CT abdomen/pelvis 10/09/2017-increased size of infiltrative retroperitoneal tumor, more conspicuous peritoneal nodules, new segment 3 liver lesion, stable left renal mass 2. Left renal cyst with a mural nodular component on MRI 02/20/2013. Indeterminate. 3. Acute appendicitis status post laparoscopic appendectomy 01/26/2013. 4. Port-A-Cath placement 03/21/2013, removed 08/26/2013. 5. Right mid to low back sebaceous cyst.  6. History of Rosacea rash over the face. 7. History of tobacco use  8. Screening chest CT 08/05/2013 revealed masslike and nodular opacities in the medial segment of the right middle lobe and in the inferior aspect of the lingula, followup chest CT 10/31/2013 revealed improvement 9. Cystic-appearing lesion in the body of the pancreas adjacent to the resection line noted on the CT 08/05/2013 and 10/31/2013-discussed with Dr. Barry Dienes, felt to represent postoperative change 10. "Pancreatitis "-managed by primary M.D. 11. Frequent bowel movements-likely secondary to pancreas insufficiency, he continues Creon, upper and lower endoscopies 10/15/2017-no explanation for the diarrhea, negative biopsies 12. Pain secondary to metastatic pancreas cancer    Disposition: Jon Pena has metastatic pancreas cancer.  His performance status appears to be declining.  He is losing weight.  He is taking frequent oxycodone for relief of abdominal pain.  I will contact the hospice program and recommend he begin methadone.  I cautioned him against driving while taking narcotics.  He appears to have a left inguinal hernia.  He requests a surgical referral to evaluate the fullness in the left inguinal region and left scrotum.  We  will try to get him into the urgent surgical clinic this week.  Jon Pena  would like to continue follow-up at the Cancer center.  He will return for an office visit in approximately 3 weeks.  15 minutes were spent with the patient today.  The majority of the time was used for counseling and coordination of care.  Betsy Coder, MD  02/12/2018  4:14 PM

## 2018-02-13 ENCOUNTER — Telehealth: Payer: Self-pay

## 2018-02-13 DIAGNOSIS — C250 Malignant neoplasm of head of pancreas: Secondary | ICD-10-CM

## 2018-02-13 MED ORDER — OXYCODONE-ACETAMINOPHEN 5-325 MG PO TABS: 1.0000 | ORAL_TABLET | ORAL | 0 refills | Status: DC | PRN

## 2018-02-13 MED ORDER — ONDANSETRON 4 MG PO TBDP: 4.0000 mg | ORAL_TABLET | Freq: Four times a day (QID) | ORAL | 1 refills | Status: AC | PRN

## 2018-02-13 NOTE — Telephone Encounter (Signed)
Spoke with Izora Gala at [facility] to inform that pt is to be seen by [facility] for inguinal hernia. This RN spoke with [facility] yesterday to obtain urgent appt for pt and was informed that pt MD unavailable and they will attempt to schedule him with another surgeon provider. Pt should receive call from there office.  Also informed Izora Gala that pain prescription refill will be faxed to pharmacy and per Dr. Benay Spice, requested that the Hospice Medical Director give recommendation about methadone dosage to initiate for pt. Izora Gala voiced understanding and will speak with director. She also verified that zofran is not covered under their formulary and she will order phenergan. Per provider. pt requested yesterday that zofran be refilled despite that information. This RN voiced understanding.

## 2018-02-14 ENCOUNTER — Telehealth: Payer: Self-pay | Admitting: Oncology

## 2018-02-14 ENCOUNTER — Other Ambulatory Visit: Payer: Self-pay | Admitting: Surgery

## 2018-02-14 DIAGNOSIS — C259 Malignant neoplasm of pancreas, unspecified: Secondary | ICD-10-CM | POA: Diagnosis not present

## 2018-02-14 DIAGNOSIS — K469 Unspecified abdominal hernia without obstruction or gangrene: Secondary | ICD-10-CM

## 2018-02-14 DIAGNOSIS — C7800 Secondary malignant neoplasm of unspecified lung: Secondary | ICD-10-CM | POA: Diagnosis not present

## 2018-02-14 DIAGNOSIS — C787 Secondary malignant neoplasm of liver and intrahepatic bile duct: Secondary | ICD-10-CM | POA: Diagnosis not present

## 2018-02-14 DIAGNOSIS — R197 Diarrhea, unspecified: Secondary | ICD-10-CM | POA: Diagnosis not present

## 2018-02-14 DIAGNOSIS — R1084 Generalized abdominal pain: Secondary | ICD-10-CM | POA: Diagnosis not present

## 2018-02-14 DIAGNOSIS — C786 Secondary malignant neoplasm of retroperitoneum and peritoneum: Secondary | ICD-10-CM | POA: Diagnosis not present

## 2018-02-14 NOTE — Telephone Encounter (Signed)
Scheduled appt per 8/27 los-  Left message with appt date and time.

## 2018-02-15 ENCOUNTER — Ambulatory Visit
Admission: RE | Admit: 2018-02-15 | Discharge: 2018-02-15 | Disposition: A | Discharge status: Home / Self Care | Payer: PPO | Source: Ambulatory Visit | Attending: Surgery | Admitting: Surgery

## 2018-02-15 DIAGNOSIS — K469 Unspecified abdominal hernia without obstruction or gangrene: Secondary | ICD-10-CM

## 2018-02-15 DIAGNOSIS — C642 Malignant neoplasm of left kidney, except renal pelvis: Secondary | ICD-10-CM | POA: Diagnosis not present

## 2018-02-15 MED ORDER — IOPAMIDOL (ISOVUE-300) INJECTION 61%
100.0000 mL | Freq: Once | INTRAVENOUS | Status: AC | PRN
  Administered 2018-02-15: 100 mL via INTRAVENOUS

## 2018-02-17 DIAGNOSIS — C786 Secondary malignant neoplasm of retroperitoneum and peritoneum: Secondary | ICD-10-CM | POA: Diagnosis not present

## 2018-02-17 DIAGNOSIS — C787 Secondary malignant neoplasm of liver and intrahepatic bile duct: Secondary | ICD-10-CM | POA: Diagnosis not present

## 2018-02-17 DIAGNOSIS — L719 Rosacea, unspecified: Secondary | ICD-10-CM | POA: Diagnosis not present

## 2018-02-17 DIAGNOSIS — E785 Hyperlipidemia, unspecified: Secondary | ICD-10-CM | POA: Diagnosis not present

## 2018-02-17 DIAGNOSIS — C7800 Secondary malignant neoplasm of unspecified lung: Secondary | ICD-10-CM | POA: Diagnosis not present

## 2018-02-17 DIAGNOSIS — R1084 Generalized abdominal pain: Secondary | ICD-10-CM | POA: Diagnosis not present

## 2018-02-17 DIAGNOSIS — C259 Malignant neoplasm of pancreas, unspecified: Secondary | ICD-10-CM | POA: Diagnosis not present

## 2018-02-17 DIAGNOSIS — R197 Diarrhea, unspecified: Secondary | ICD-10-CM | POA: Diagnosis not present

## 2018-02-17 DIAGNOSIS — R110 Nausea: Secondary | ICD-10-CM | POA: Diagnosis not present

## 2018-02-19 ENCOUNTER — Telehealth: Payer: Self-pay | Admitting: Surgery

## 2018-02-19 NOTE — Telephone Encounter (Signed)
Called patient to notify him of CT results. There is no hernia. 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 He expressed understanding although remains skeptical of the report and that malignancy is contributing to his abdominal pain, anorexia, weight loss and left groin pain/ swelling. Patient encouraged to continue whatever measures he deems appropriate to maximize quality of life.

## 2018-03-05 ENCOUNTER — Inpatient Hospital Stay: Payer: Medicare Other | Admitting: Oncology

## 2018-03-12 ENCOUNTER — Emergency Department (HOSPITAL_COMMUNITY)

## 2018-03-12 ENCOUNTER — Encounter (HOSPITAL_COMMUNITY): Payer: Self-pay | Admitting: Family Medicine

## 2018-03-12 ENCOUNTER — Inpatient Hospital Stay (HOSPITAL_COMMUNITY)
Admission: EM | Admit: 2018-03-12 | Discharge: 2018-03-14 | DRG: 947 | Disposition: A | Discharge status: Hospice | Attending: Family Medicine | Admitting: Family Medicine

## 2018-03-12 ENCOUNTER — Other Ambulatory Visit: Payer: Self-pay

## 2018-03-12 DIAGNOSIS — E785 Hyperlipidemia, unspecified: Secondary | ICD-10-CM | POA: Diagnosis present

## 2018-03-12 DIAGNOSIS — D630 Anemia in neoplastic disease: Secondary | ICD-10-CM | POA: Diagnosis present

## 2018-03-12 DIAGNOSIS — C259 Malignant neoplasm of pancreas, unspecified: Secondary | ICD-10-CM | POA: Diagnosis not present

## 2018-03-12 DIAGNOSIS — Z79891 Long term (current) use of opiate analgesic: Secondary | ICD-10-CM

## 2018-03-12 DIAGNOSIS — Z515 Encounter for palliative care: Secondary | ICD-10-CM | POA: Diagnosis not present

## 2018-03-12 DIAGNOSIS — E43 Unspecified severe protein-calorie malnutrition: Secondary | ICD-10-CM | POA: Diagnosis present

## 2018-03-12 DIAGNOSIS — Z681 Body mass index (BMI) 19 or less, adult: Secondary | ICD-10-CM

## 2018-03-12 DIAGNOSIS — Z791 Long term (current) use of non-steroidal anti-inflammatories (NSAID): Secondary | ICD-10-CM

## 2018-03-12 DIAGNOSIS — Z66 Do not resuscitate: Secondary | ICD-10-CM | POA: Diagnosis present

## 2018-03-12 DIAGNOSIS — H9190 Unspecified hearing loss, unspecified ear: Secondary | ICD-10-CM | POA: Diagnosis present

## 2018-03-12 DIAGNOSIS — K5900 Constipation, unspecified: Secondary | ICD-10-CM | POA: Diagnosis present

## 2018-03-12 DIAGNOSIS — N201 Calculus of ureter: Secondary | ICD-10-CM | POA: Diagnosis not present

## 2018-03-12 DIAGNOSIS — K219 Gastro-esophageal reflux disease without esophagitis: Secondary | ICD-10-CM | POA: Diagnosis present

## 2018-03-12 DIAGNOSIS — Z7982 Long term (current) use of aspirin: Secondary | ICD-10-CM

## 2018-03-12 DIAGNOSIS — N1330 Unspecified hydronephrosis: Secondary | ICD-10-CM | POA: Diagnosis not present

## 2018-03-12 DIAGNOSIS — F17210 Nicotine dependence, cigarettes, uncomplicated: Secondary | ICD-10-CM | POA: Diagnosis present

## 2018-03-12 DIAGNOSIS — N132 Hydronephrosis with renal and ureteral calculous obstruction: Secondary | ICD-10-CM | POA: Diagnosis present

## 2018-03-12 DIAGNOSIS — Z9221 Personal history of antineoplastic chemotherapy: Secondary | ICD-10-CM

## 2018-03-12 DIAGNOSIS — C799 Secondary malignant neoplasm of unspecified site: Secondary | ICD-10-CM | POA: Diagnosis not present

## 2018-03-12 DIAGNOSIS — N2889 Other specified disorders of kidney and ureter: Secondary | ICD-10-CM | POA: Diagnosis not present

## 2018-03-12 DIAGNOSIS — Z923 Personal history of irradiation: Secondary | ICD-10-CM

## 2018-03-12 DIAGNOSIS — G893 Neoplasm related pain (acute) (chronic): Principal | ICD-10-CM | POA: Diagnosis present

## 2018-03-12 DIAGNOSIS — Z79899 Other long term (current) drug therapy: Secondary | ICD-10-CM

## 2018-03-12 LAB — URINALYSIS, ROUTINE W REFLEX MICROSCOPIC
Bilirubin Urine: NEGATIVE
Glucose, UA: NEGATIVE mg/dL
Ketones, ur: NEGATIVE mg/dL
Nitrite: NEGATIVE
Protein, ur: NEGATIVE mg/dL
SPECIFIC GRAVITY, URINE: 1.016 (ref 1.005–1.030)
pH: 6 (ref 5.0–8.0)

## 2018-03-12 LAB — COMPREHENSIVE METABOLIC PANEL
ALBUMIN: 3.6 g/dL (ref 3.5–5.0)
ALK PHOS: 107 U/L (ref 38–126)
ALT: 11 U/L (ref 0–44)
ANION GAP: 10 (ref 5–15)
AST: 17 U/L (ref 15–41)
BUN: 23 mg/dL (ref 8–23)
CHLORIDE: 97 mmol/L — AB (ref 98–111)
CO2: 28 mmol/L (ref 22–32)
Calcium: 9.5 mg/dL (ref 8.9–10.3)
Creatinine, Ser: 1.03 mg/dL (ref 0.61–1.24)
GFR calc non Af Amer: >60 mL/min (ref >60)
GLUCOSE: 124 mg/dL — AB (ref 70–99)
POTASSIUM: 4.1 mmol/L (ref 3.5–5.1)
SODIUM: 135 mmol/L (ref 135–145)
Total Bilirubin: 0.8 mg/dL (ref 0.3–1.2)
Total Protein: 6.6 g/dL (ref 6.5–8.1)

## 2018-03-12 LAB — CBC WITH DIFFERENTIAL/PLATELET
BASOS PCT: 1 %
Basophils Absolute: 0 10*3/uL (ref 0.0–0.1)
EOS ABS: 0.2 10*3/uL (ref 0.0–0.7)
EOS PCT: 3 %
HCT: 37.7 % — ABNORMAL LOW (ref 39.0–52.0)
HEMOGLOBIN: 12.9 g/dL — AB (ref 13.0–17.0)
Lymphocytes Relative: 18 %
Lymphs Abs: 1.2 10*3/uL (ref 0.7–4.0)
MCH: 31.5 pg (ref 26.0–34.0)
MCHC: 34.2 g/dL (ref 30.0–36.0)
MCV: 92.2 fL (ref 78.0–100.0)
MONOS PCT: 12 %
Monocytes Absolute: 0.8 10*3/uL (ref 0.1–1.0)
NEUTROS PCT: 66 %
Neutro Abs: 4.2 10*3/uL (ref 1.7–7.7)
PLATELETS: 258 10*3/uL (ref 150–400)
RBC: 4.09 MIL/uL — ABNORMAL LOW (ref 4.22–5.81)
RDW: 13.6 % (ref 11.5–15.5)
WBC: 6.4 10*3/uL (ref 4.0–10.5)

## 2018-03-12 LAB — TROPONIN I

## 2018-03-12 LAB — LIPASE, BLOOD: Lipase: 22 U/L (ref 11–51)

## 2018-03-12 MED ORDER — IOPAMIDOL (ISOVUE-300) INJECTION 61%
INTRAVENOUS | Status: AC
  Filled 2018-03-12: qty 100

## 2018-03-12 MED ORDER — PANTOPRAZOLE SODIUM 40 MG PO TBEC
40.0000 mg | DELAYED_RELEASE_TABLET | Freq: Every day | ORAL | Status: DC
  Administered 2018-03-13 – 2018-03-14 (×2): 40 mg via ORAL
  Filled 2018-03-12 (×2): qty 1

## 2018-03-12 MED ORDER — OXYCODONE-ACETAMINOPHEN 5-325 MG PO TABS: 1.0000 | ORAL_TABLET | ORAL | Status: DC | PRN

## 2018-03-12 MED ORDER — HYDROMORPHONE HCL 1 MG/ML IJ SOLN
1.0000 mg | INTRAMUSCULAR | Status: DC | PRN
  Administered 2018-03-12 – 2018-03-14 (×14): 1 mg via INTRAVENOUS
  Filled 2018-03-12 (×14): qty 1

## 2018-03-12 MED ORDER — ONDANSETRON HCL 4 MG/2ML IJ SOLN
4.0000 mg | Freq: Once | INTRAMUSCULAR | Status: AC
  Administered 2018-03-12: 4 mg via INTRAVENOUS
  Filled 2018-03-12: qty 2

## 2018-03-12 MED ORDER — POLYETHYL GLYCOL-PROPYL GLYCOL 0.4-0.3 % OP SOLN: 1.0000 [drp] | Freq: Three times a day (TID) | OPHTHALMIC | Status: DC | PRN

## 2018-03-12 MED ORDER — DIPHENOXYLATE-ATROPINE 2.5-0.025 MG PO TABS: 1.0000 | ORAL_TABLET | Freq: Four times a day (QID) | ORAL | Status: DC | PRN

## 2018-03-12 MED ORDER — POLYVINYL ALCOHOL 1.4 % OP SOLN
1.0000 [drp] | Freq: Three times a day (TID) | OPHTHALMIC | Status: DC | PRN
  Filled 2018-03-12: qty 15

## 2018-03-12 MED ORDER — SODIUM CHLORIDE 0.9 % IV SOLN
INTRAVENOUS | Status: DC
  Administered 2018-03-12 – 2018-03-13 (×2): via INTRAVENOUS

## 2018-03-12 MED ORDER — PANCRELIPASE (LIP-PROT-AMYL) 12000-38000 UNITS PO CPEP
36000.0000 [IU] | ORAL_CAPSULE | Freq: Three times a day (TID) | ORAL | Status: DC
  Administered 2018-03-13 – 2018-03-14 (×4): 36000 [IU] via ORAL
  Filled 2018-03-12 (×3): qty 3

## 2018-03-12 MED ORDER — IOPAMIDOL (ISOVUE-300) INJECTION 61%
100.0000 mL | Freq: Once | INTRAVENOUS | Status: AC | PRN
  Administered 2018-03-12: 80 mL via INTRAVENOUS

## 2018-03-12 MED ORDER — ONDANSETRON HCL 4 MG PO TABS: 4.0000 mg | ORAL_TABLET | Freq: Four times a day (QID) | ORAL | Status: DC | PRN

## 2018-03-12 MED ORDER — PANCRELIPASE (LIP-PROT-AMYL) 12000-38000 UNITS PO CPEP: 36000.0000 [IU] | ORAL_CAPSULE | ORAL | Status: DC

## 2018-03-12 MED ORDER — MORPHINE SULFATE ER 15 MG PO TBCR
15.0000 mg | EXTENDED_RELEASE_TABLET | Freq: Two times a day (BID) | ORAL | Status: DC
  Administered 2018-03-12 – 2018-03-14 (×4): 15 mg via ORAL
  Filled 2018-03-12 (×5): qty 1

## 2018-03-12 MED ORDER — HYDROMORPHONE HCL 1 MG/ML IJ SOLN
0.5000 mg | Freq: Once | INTRAMUSCULAR | Status: AC
  Administered 2018-03-12: 0.5 mg via INTRAVENOUS
  Filled 2018-03-12: qty 1

## 2018-03-12 MED ORDER — ONDANSETRON HCL 4 MG/2ML IJ SOLN
4.0000 mg | Freq: Four times a day (QID) | INTRAMUSCULAR | Status: DC | PRN
  Administered 2018-03-12 – 2018-03-13 (×2): 4 mg via INTRAVENOUS
  Filled 2018-03-12 (×2): qty 2

## 2018-03-12 MED ORDER — HYDROMORPHONE HCL 1 MG/ML IJ SOLN
1.0000 mg | Freq: Once | INTRAMUSCULAR | Status: AC
  Administered 2018-03-12: 1 mg via INTRAVENOUS
  Filled 2018-03-12: qty 1

## 2018-03-12 MED ORDER — IOHEXOL 300 MG/ML  SOLN
30.0000 mL | Freq: Once | INTRAMUSCULAR | Status: AC | PRN
  Administered 2018-03-12: 30 mL via ORAL

## 2018-03-12 MED ORDER — SODIUM CHLORIDE 0.9 % IV BOLUS
1000.0000 mL | Freq: Once | INTRAVENOUS | Status: AC
  Administered 2018-03-12: 1000 mL via INTRAVENOUS

## 2018-03-12 MED ORDER — PANCRELIPASE (LIP-PROT-AMYL) 12000-38000 UNITS PO CPEP
36000.0000 [IU] | ORAL_CAPSULE | Freq: Three times a day (TID) | ORAL | Status: DC
  Administered 2018-03-12 – 2018-03-13 (×4): 36000 [IU] via ORAL
  Filled 2018-03-12: qty 3

## 2018-03-12 MED ORDER — GABAPENTIN 100 MG PO CAPS: 100.0000 mg | ORAL_CAPSULE | Freq: Three times a day (TID) | ORAL | Status: DC | PRN

## 2018-03-12 NOTE — ED Notes (Addendum)
called to give report, RN unavailable at this time.

## 2018-03-12 NOTE — ED Notes (Signed)
Bed: WU88 Expected date:  Expected time:  Means of arrival:  Comments: EMS 67 yo male nausea and vomiting x 4 days-abd pain-hx pancreatic cancer-hospice patient

## 2018-03-12 NOTE — Consult Note (Signed)
Reason for Consult:Right Ureteral Stone, Left Malignant Obstruction, Left Renal Mass  Referring Physician: Marzetta Board MD  Jon Pena is an 67 y.o. male.   HPI:   1 - Right Ureteral Stone - new Rt 33mm prox ureteral stone with mild hydro by ER CT 02/2018 on eval progressive abd pain. Stone is solitary. Cr 1.03, UA withotu infectious parameters.  2 - Left Malignant Obstruction - progressive left distal ureteral / UVJ obstruction due to likely metastatic pancreas cancer by CT 02/2018 compared to 09/2017.   3- Left Renal Mass - 2.2cm left enhancing cortical mass on CT x several 2019.   4 - End of Life Care - pt in home hospice at baseline, but beomcing more difficult to control pain and help with ADLs.   PMH sig for metastatic pancreatic cancer on home hospice.   Today " Jon Pena " is seen in consultation for above.   Past Medical History:  Diagnosis Date  . GERD (gastroesophageal reflux disease)   . HOH (hard of hearing)   . Hyperlipidemia   . Pancreatic adenocarcinoma ([place]) 02/27/13   Stage IIB (T3 N1) status post distal pancreatectomy/splenectomy, status post chemotherapy and XRT  . Pancreatitis   . Pre-diabetes     Past Surgical History:  Procedure Laterality Date  . APPENDECTOMY    . BIOPSY  10/15/2017   Procedure: BIOPSY;  Surgeon: Rogene Houston, MD;  [facility];  Service: Endoscopy;;  duodenum gastric colon  . COLONOSCOPY    . COLONOSCOPY N/A 10/15/2017   Procedure: COLONOSCOPY;  Surgeon: Rogene Houston, MD;  [facility];  Service: Endoscopy;  Laterality: N/A;  . ESOPHAGOGASTRODUODENOSCOPY N/A 10/15/2017   Procedure: ESOPHAGOGASTRODUODENOSCOPY (EGD);  Surgeon: Rogene Houston, MD;  [facility];  Service: Endoscopy;  Laterality: N/A;  12:30  . EUS N/A 02/20/2013   Procedure: UPPER ENDOSCOPIC ULTRASOUND (EUS) LINEAR;  Surgeon: Milus Banister, MD;  Location: [facility];  Service: Endoscopy;  Laterality: N/A;  . EUS N/A 02/01/2017    Procedure: UPPER ENDOSCOPIC ULTRASOUND (EUS) LINEAR;  Surgeon: Milus Banister, MD;  Location: [facility];  Service: Endoscopy;  Laterality: N/A;  . LAPAROSCOPIC APPENDECTOMY N/A 01/26/2013   Procedure: APPENDECTOMY LAPAROSCOPIC;  Surgeon: Zenovia Jarred, MD;  Location: [place];  Service: General;  Laterality: N/A;  . LAPAROSCOPIC SPLENECTOMY N/A 02/27/2013   Procedure: LAPAROSCOPIC SPLENECTOMY;  Surgeon: Stark Klein, MD;  Location: [place];  Service: General;  Laterality: N/A;  . PANCREATECTOMY N/A 02/27/2013   Procedure: LAPAROSCOPIC PANCREATECTOMY;  Surgeon: Stark Klein, MD;  Location: [place];  Service: General;  Laterality: N/A;  . POLYPECTOMY    . PORT-A-CATH REMOVAL Left 08/26/2013   Procedure: REMOVAL PORT-A-CATH;  Surgeon: Stark Klein, MD;  Location: [place];  Service: General;  Laterality: Left;  . PORTACATH PLACEMENT N/A 03/21/2013   Procedure: INSERTION PORT-A-CATH;  Surgeon: Stark Klein, MD;  Location: [place];  Service: General;  Laterality: N/A;  . TONSILLECTOMY      Family History  Problem Relation Age of Onset  . Pancreatitis Father   . Breast cancer Paternal Aunt   . Colon cancer Neg Hx   . Rectal cancer Neg Hx   . Stomach cancer Neg Hx     Social History:  reports that he has been smoking cigarettes. He has a 10.00 pack-year smoking history. He has never used smokeless tobacco. He reports that he drank alcohol. He reports that he has current or past  drug history.  Allergies: No Known Allergies  Medications: I have reviewed the patient's current medications.    Review of Systems  Constitutional: Positive for fever, malaise/fatigue and weight loss.  HENT: Negative.   Eyes: Negative.   Respiratory: Negative.   Cardiovascular: Negative.   Gastrointestinal: Positive for abdominal pain, nausea and vomiting.  Genitourinary: Positive for flank pain.  Skin: Negative.   Neurological: Negative.   Endo/Heme/Allergies: Negative.    Psychiatric/Behavioral: Negative.    Blood pressure (!) 146/86, pulse (!) 50, temperature (!) 97.5 F (36.4 C), temperature source Oral, resp. rate 17, height 5' 10.5" (1.791 m), weight 54 kg, SpO2 99 %. Physical Exam  Constitutional: He is oriented to person, place, and time.  Frail, stigmata of advanced cancer. Friend at bedside.   HENT:  Head: Normocephalic.  Eyes: Pupils are equal, round, and reactive to light.  Neck: Normal range of motion.  Cardiovascular: Normal rate.  Respiratory: Effort normal.  GI: Soft.  Palpable abdominal masses that are firm c/w metastasis, bilateral mild CVAT.   Genitourinary: Penis normal.  Musculoskeletal: Normal range of motion.  Neurological: He is alert and oriented to person, place, and time.  Skin: Skin is warm.  Psychiatric: He has a normal mood and affect.    Assessment/Plan:  1 - Bilateral Ureteral Obstruction in Setting of Progressive Metastatic Cancer - we discussed natural history of bilateral obstruction with likely progressive renal failure, possibly leading to uremia and lethality over period of likely several weeks v. Aggressive path with bilateral neph tubes to relive obstruction. He would not be surgical candidate. He adamantly wants NO INTERVENTION for his obstruction.   2 - End of Life Care - pt's primary goal is pain control and comfort care. He desires DC to inpatient hospice / [facility] if possible. His level of care and pain control requirements becoming too taxing even for home hospice. I agree completely that this would be optimal if logistically possible.  I will follow pt PRN at this point. Please call me anytime with questions.   Fransheska Willingham 03/12/2018, 4:04 PM

## 2018-03-12 NOTE — ED Notes (Signed)
Patient transported to CT 

## 2018-03-12 NOTE — ED Provider Notes (Addendum)
  Physical Exam  BP (!) 142/92 (BP Location: Left Arm)   Pulse (!) 52   Temp 98.1 F (36.7 C) (Oral)   Resp 18   Ht 5' 10.5" (1.791 m)   Wt 54 kg   SpO2 97%   BMI 16.83 kg/m   Physical Exam  ED Course/Procedures     Procedures  MDM  Assuming care of patient from Dr. Wyvonnia Dusky.   Patient in the ED for severe abdominal pain and has hx of metastatic pancreatic case. Pt has severe pain, generalized. Pt doesn't get chemo/radiation.  Important pending results are CT abdomen.  According to Dr. Wyvonnia Dusky, plan is to f/u CT scan.   Patient had no complains, no concerns from the nursing side. Will continue to monitor.  Anticipate admission.   Varney Biles, MD 03/12/18 0800  9:19 AM Results of the CT scan reviewed. It appears that patient has worsening hydronephrosis bilaterally, left worse than right. Oncology consulted - no acute oncologic evaluation needed per them. We will admit.  Pt will likely need further palliative involvement and might need palliative urologic procedure. Hospice and [facility] made aware of patient being in the ER - and they will send Anderson Malta, Nurse liason to the Hospital.  Pt is DNR and there is a sister who has been involved in his care as well according to the Hospice team.    Varney Biles, MD 03/12/18 [phone]

## 2018-03-12 NOTE — Progress Notes (Addendum)
[facility] ([facility])  GIP RN Visit  This is a related and covered GIP admission on 9/24 with [facility] diagnosis of carcinoma of the pancreas, per Dr. Lyman Speller.  Patient has an [place] DNR.  Pt began having 10/10 abdominal pain last night around 10 pm.  He took his pain medication early--as he normally stays at a 4/10 with his pain.  He called our on call staff, was advised to take antiemetic then another analgesic.  Pt did as instructed, was informed a RN could be to his house in 20 minutes, but he advised he could not wait any longer.  He activated EMS and was transported to [facility] ED.    Spoke with pt in the ED with Dr. Cruzita Lederer, who informed him of his CT results of his abdomen.  He was advised that his ct showed showed bilateral ureteral obstruction with hydronephrosis and hydroureter.  Urology consult was placed.     Pt is conscious, alert and oriented.  Lives with his mother (whom he cares for).  He appeared to be comfortable, but had just received pain medication.    Upon arrival to ED, pt received 1 liter IV NS, Three doses of IV dilaudid, and an additional dose after arrival to unit.    This is day 1 of [facility] GIP.  Discussion with [facility] physician around possibility of intervention from urological stand point, further discussion will be needed with pt and [facility] SW should pt decide that he would like to pursue invasive treatment.    Goals of care:  Ongoing.  Pt wants to be comfortable and to be at home.  Discussion will occur when pt decides what direction he would like to pursue.  Communication to [facility]:  Attempted to speak with Manus Gunning x 2, she lives in [place].  Left VMs and advised how I could be reached.   Communication to [facility]:  Spoke with [facility] staff (RN, SW, MD), as well as ED RN, RN on 6E.    Transfer summary, and medication list placed in the shadow chart.  Should transport be needed, please use [facility] as they contract with [facility].   Please call with any hospice related  questions/concerns  Thank you, Venia Carbon BSN, [facility] Liaison [phone]  [facility] Liaisons are listed in [facility]

## 2018-03-12 NOTE — H&P (Addendum)
History and Physical    JAMAIR CATO GBT:[id number] DOB: Sep 12, 1950 DOA: 03/12/2018  I have briefly reviewed the patient's prior medical records in [place]  PCP: Sharilyn Sites, MD  Patient coming from: home  Chief Complaint: severe abdominal pain, nausea/vomiting  HPI: Jon Pena is a 67 y.o. male with medical history significant of widely metastatic pancreatic cancer currently on hospice, HLD, who presents to the hospital with complaints of intractable abdominal pain. He states that his home regimen was unable to get his pain under control and his pain got a 10/10, also experienced nausea and about 10 emesis episodes at home and decided to come to the ER. He denies fevers or chills, denies chest pain of dyspnea. Also complains of constipation. Complains of a poor appetite. Denies decreased urination or dysuria  ED Course: in the ED required severe doses of IV dilaudid to get him to a comfortable level. His vitals are stable, blood work shows mild anemia but otherwise unremarkable. Underwent a CT abdomen which showed bilateral ureteral obstruction with hydronephrosis and hydroureter. We were asked to admit for pain control   Review of Systems: As per HPI otherwise 10 point review of systems negative.   Past Medical History:  Diagnosis Date  . GERD (gastroesophageal reflux disease)   . HOH (hard of hearing)   . Hyperlipidemia   . Pancreatic adenocarcinoma ([place]) 02/27/13   Stage IIB (T3 N1) status post distal pancreatectomy/splenectomy, status post chemotherapy and XRT  . Pancreatitis   . Pre-diabetes     Past Surgical History:  Procedure Laterality Date  . APPENDECTOMY    . BIOPSY  10/15/2017   Procedure: BIOPSY;  Surgeon: Rogene Houston, MD;  [facility];  Service: Endoscopy;;  duodenum gastric colon  . COLONOSCOPY    . COLONOSCOPY N/A 10/15/2017   Procedure: COLONOSCOPY;  Surgeon: Rogene Houston, MD;  [facility];  Service: Endoscopy;   Laterality: N/A;  . ESOPHAGOGASTRODUODENOSCOPY N/A 10/15/2017   Procedure: ESOPHAGOGASTRODUODENOSCOPY (EGD);  Surgeon: Rogene Houston, MD;  [facility];  Service: Endoscopy;  Laterality: N/A;  12:30  . EUS N/A 02/20/2013   Procedure: UPPER ENDOSCOPIC ULTRASOUND (EUS) LINEAR;  Surgeon: Milus Banister, MD;  [facility];  Service: Endoscopy;  Laterality: N/A;  . EUS N/A 02/01/2017   Procedure: UPPER ENDOSCOPIC ULTRASOUND (EUS) LINEAR;  Surgeon: Milus Banister, MD;  [facility];  Service: Endoscopy;  Laterality: N/A;  . LAPAROSCOPIC APPENDECTOMY N/A 01/26/2013   Procedure: APPENDECTOMY LAPAROSCOPIC;  Surgeon: Zenovia Jarred, MD;  Location: [place];  Service: General;  Laterality: N/A;  . LAPAROSCOPIC SPLENECTOMY N/A 02/27/2013   Procedure: LAPAROSCOPIC SPLENECTOMY;  Surgeon: Stark Klein, MD;  Location: [place];  Service: General;  Laterality: N/A;  . PANCREATECTOMY N/A 02/27/2013   Procedure: LAPAROSCOPIC PANCREATECTOMY;  Surgeon: Stark Klein, MD;  Location: [place];  Service: General;  Laterality: N/A;  . POLYPECTOMY    . PORT-A-CATH REMOVAL Left 08/26/2013   Procedure: REMOVAL PORT-A-CATH;  Surgeon: Stark Klein, MD;  Location: [place];  Service: General;  Laterality: Left;  . PORTACATH PLACEMENT N/A 03/21/2013   Procedure: INSERTION PORT-A-CATH;  Surgeon: Stark Klein, MD;  Location: [place];  Service: General;  Laterality: N/A;  . TONSILLECTOMY       reports that he has been smoking cigarettes. He has a 10.00 pack-year smoking history. He has never used smokeless tobacco. He reports that he drank alcohol. He reports that  he has current or past drug history.  No Known Allergies  Family History  Problem Relation Age of Onset  . Pancreatitis Father   . Breast cancer Paternal Aunt   . Colon cancer Neg Hx   . Rectal cancer Neg Hx   . Stomach cancer Neg Hx     Prior to Admission medications   Medication Sig Start Date End  Date Taking? Authorizing Provider  aspirin EC 81 MG tablet Take 1 tablet (81 mg total) by mouth every other day. 10/18/17  Yes Rehman, Mechele Dawley, MD  diphenoxylate-atropine (LOMOTIL) 2.5-0.025 MG tablet Take 1 tablet by mouth 4 (four) times daily as needed for diarrhea or loose stools. 11/23/17  Yes Ladell Pier, MD  famotidine (PEPCID) 10 MG tablet Take 10 mg by mouth 2 (two) times daily as needed for heartburn.    Yes [provider]  gabapentin (NEURONTIN) 100 MG capsule Take 1 capsule (100 mg total) by mouth 3 (three) times daily. Patient taking differently: Take 100 mg by mouth 3 (three) times daily as needed (nerve pain).  01/08/18  Yes Ladell Pier, MD  HYDROcodone-acetaminophen (NORCO/VICODIN) 5-325 MG tablet TAKE ONE TABLET EVERY FOUR HOURS AS NEEDED FOR MODERATE PAIN Patient taking differently: Take 1 tablet by mouth every 4 (four) hours as needed for moderate pain or severe pain.  12/19/17  Yes Ladell Pier, MD  lipase/protease/amylase (CREON) 36000 UNITS CPEP capsule Take 1-4 capsules (36,000-144,000 Units total) by mouth See admin instructions. Take 4 caps with each meal and one cap with snacks. Patient taking differently: Take 36,000 Units by mouth See admin instructions. Take 1 caps with each meal and one cap with snacks. 10/23/17  Yes Rehman, Mechele Dawley, MD  meloxicam (MOBIC) 15 MG tablet Take 1 tablet (15 mg total) by mouth daily. Patient taking differently: Take 15 mg by mouth daily as needed for pain.  12/10/17  Yes Ladell Pier, MD  morphine (MS CONTIN) 15 MG 12 hr tablet Take 15 mg by mouth every 12 (twelve) hours.   Yes [provider]  oxyCODONE-acetaminophen (PERCOCET/ROXICET) 5-325 MG tablet Take 1-2 tablets by mouth every 4 (four) hours as needed (for pain not relieved by hydrocodone). 02/13/18  Yes Ladell Pier, MD  Polyethyl Glycol-Propyl Glycol (LUBRICANT EYE DROPS) 0.4-0.3 % SOLN Place 1 drop into both eyes 3 (three) times daily as needed (for  dry/irritated eyes.).   Yes [provider]  promethazine (PHENERGAN) 25 MG tablet Take 25 mg by mouth every 6 (six) hours as needed for nausea or vomiting.   Yes [provider]  ondansetron (ZOFRAN-ODT) 4 MG disintegrating tablet Take 1 tablet (4 mg total) by mouth every 6 (six) hours as needed for nausea or vomiting. Patient not taking: Reported on 03/12/2018 02/13/18   Ladell Pier, MD  pantoprazole (PROTONIX) 40 MG tablet Take 1 tablet (40 mg total) by mouth daily. Patient not taking: Reported on 03/12/2018 07/09/17   Butch Penny, NP    Physical Exam: Vitals:   03/12/18 0730 03/12/18 0800 03/12/18 0830 03/12/18 0900  BP: (!) 164/88 136/82 123/79 139/72  Pulse: (!) 54 (!) 50 (!) 50 (!) 52  Resp:  16  16  Temp:      TempSrc:      SpO2: 98% 93% 96% 98%  Weight:      Height:          Constitutional: cachectic caucasian male  Eyes: pinpoint pupils, lids and conjunctivae normal ENMT: Mucous membranes  are moist.  Neck: normal, supple Respiratory: clear to auscultation bilaterally, no wheezing, no crackles. Normal respiratory effort. No accessory muscle use.  Cardiovascular: Regular rate and rhythm, no murmurs / rubs / gallops. No extremity edema. 2+ pedal pulses.  Abdomen: diffuse tenderness to palpation, no guarding or rebound  Musculoskeletal: no clubbing / cyanosis. Skin: no rashes, lesions, ulcers. No induration Neurologic: CN 2-12 grossly intact. Strength 5/5 in all 4.  Psychiatric: Normal judgment and insight. Alert and oriented x 3. Normal mood.   Labs on Admission: I have personally reviewed following labs and imaging studies  CBC: Recent Labs  Lab 03/12/18 0135  WBC 6.4  NEUTROABS 4.2  HGB 12.9*  HCT 37.7*  MCV 92.2  PLT 166   Basic Metabolic Panel: Recent Labs  Lab 03/12/18 0135  NA 135  K 4.1  CL 97*  CO2 28  GLUCOSE 124*  BUN 23  CREATININE 1.03  CALCIUM 9.5   GFR: Estimated Creatinine Clearance: 53.2 mL/min (by C-G formula  based on SCr of 1.03 mg/dL). Liver Function Tests: Recent Labs  Lab 03/12/18 0135  AST 17  ALT 11  ALKPHOS 107  BILITOT 0.8  PROT 6.6  ALBUMIN 3.6   Recent Labs  Lab 03/12/18 0135  LIPASE 22   No results for input(s): AMMONIA in the last 168 hours. Coagulation Profile: No results for input(s): INR, PROTIME in the last 168 hours. Cardiac Enzymes: Recent Labs  Lab 03/12/18 0135  TROPONINI <0.03   BNP (last 3 results) No results for input(s): PROBNP in the last 8760 hours. HbA1C: No results for input(s): HGBA1C in the last 72 hours. CBG: No results for input(s): GLUCAP in the last 168 hours. Lipid Profile: No results for input(s): CHOL, HDL, LDLCALC, TRIG, CHOLHDL, LDLDIRECT in the last 72 hours. Thyroid Function Tests: No results for input(s): TSH, T4TOTAL, FREET4, T3FREE, THYROIDAB in the last 72 hours. Anemia Panel: No results for input(s): VITAMINB12, FOLATE, FERRITIN, TIBC, IRON, RETICCTPCT in the last 72 hours. Urine analysis:    Component Value Date/Time   COLORURINE YELLOW 02/01/2018 1006   APPEARANCEUR CLEAR 02/01/2018 1006   LABSPEC 1.024 02/01/2018 1006   PHURINE 5.0 02/01/2018 1006   GLUCOSEU NEGATIVE 02/01/2018 1006   HGBUR MODERATE (A) 02/01/2018 1006   BILIRUBINUR NEGATIVE 02/01/2018 1006   KETONESUR NEGATIVE 02/01/2018 1006   PROTEINUR 30 (A) 02/01/2018 1006   UROBILINOGEN 0.2 03/21/2013 0937   NITRITE NEGATIVE 02/01/2018 1006   LEUKOCYTESUR TRACE (A) 02/01/2018 1006     Radiological Exams on Admission: Ct Abdomen Pelvis W Contrast  Result Date: 03/12/2018 CLINICAL DATA:  Mid abdominal pain with nausea and vomiting. Hospice care for pancreatic cancer. EXAM: CT ABDOMEN AND PELVIS WITH CONTRAST TECHNIQUE: Multidetector CT imaging of the abdomen and pelvis was performed using the standard protocol following bolus administration of intravenous contrast. CONTRAST:  9mL ISOVUE-300 IOPAMIDOL (ISOVUE-300) INJECTION 61% COMPARISON:  02/15/2018 FINDINGS:  Lower chest: Moderate left pleural effusion with passive atelectasis and additional mild bibasilar subsegmental atelectasis. Hepatobiliary: Periportal edema. Previously seen tumor along the left hepatic lobe margin is less sharply defined than on the prior exam but probably still present. The metastatic lesion peripherally in the right hepatic lobe shown on the prior exam is less well seen on today's exam. Pancreas: Notable tumor along the expected location of the pancreatic tail, infiltrating around adjacent vascular structures, the left adrenal gland, and the retroperitoneum as before. Spleen: Splenectomy Adrenals/Urinary Tract: The left adrenal gland is partially encased in tumor, as before. There  is a 2.3 by 1.9 cm left kidney upper pole enhancing mass compatible with renal cell carcinoma or a metastatic lesion. Delayed enhancement of the left kidney with left hydronephrosis and hydroureter observed. 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 There is tumor around the proximal ureters extending down along the retroperitoneum to the iliac vessel cross over, probably obstructing the ureters. This is mildly worsened compared to the prior exam. There is new moderate right hydronephrosis and right hydroureter due to a 6 by 4 mm right proximal ureteral calculus just past the UPJ on image 38/2. Stomach/Bowel: Ill-defined bowel. Tumor in the lesser sac region abutting the stomach, as before. No dilated bowel. Vascular/Lymphatic: Tumor encases the celiac trunk, superior mesenteric artery, left renal artery, and upper abdominal aorta. Tumor abuts but does not totally encase the right renal artery. Ill definition of segments of the portal vein in the vicinity of the tumor. Aortoiliac atherosclerotic vascular disease. Reproductive: Mild prostatomegaly with central calcifications. Other: Diffuse subcutaneous and mesenteric edema. Mild ascites, increased from prior. Musculoskeletal: 2.7 by 2.0 cm sclerotic  lesion eccentric to the left in the L2 vertebra, likely metastatic. 5 mm of degenerative anterolisthesis at L4-5. Impingement at L4-5. Ill-defined tumor in the left rectus abdominus muscle, as before. IMPRESSION: 1. Bilateral ureteral obstruction. On the left the patient is a duplicated collecting system with both proximal ureters obstructive by tumor. On the right the patient has a 6 mm proximal ureteral calculus causing hydronephrosis and hydroureter. The severity of obstruction is more complete on the left. 2. There is diffuse third spacing of fluid with subcutaneous and mesenteric edema as well as ascites. Left pleural effusion. 3. Ill-defined tumor along the pancreas extending into the lesser sac, encasing the left adrenal gland, and encasing upper abdominal vascular structures including the celiac trunk, SMA, aorta, renal veins, and portal vein. Patency of the portal vein is indeterminate as portions of the portal vein are poorly seen. 4. 2.3 cm left kidney upper pole mass, renal cell carcinoma versus metastatic lesion. 5. 2.7 cm sclerotic lesion in the left L2 vertebra, likely metastatic. 6. Impingement at L4-5 due to spondylosis and degenerative disc disease. 7. Roughly stable tumor involvement of the left rectus abdominus muscle. Electronically Signed   By: Van Clines M.D.   On: 03/12/2018 08:24   Dg Abdomen Acute W/chest  Result Date: 03/12/2018 CLINICAL DATA:  Metastatic pancreatic cancer.  Vomiting and nausea. EXAM: DG ABDOMEN ACUTE W/ 1V CHEST COMPARISON:  03/21/2013 chest radiograph. FINDINGS: Stable cardiomediastinal silhouette with normal heart size. No pneumothorax. Small dependent left pleural effusion. No right pleural effusion. No pulmonary edema. No acute consolidative airspace disease. Mildly dilated central small bowel loop with air-fluid level. Moderate colorectal stool volume. No evidence of pneumatosis or pneumoperitoneum. No radiopaque nephrolithiasis. IMPRESSION: 1. Small  dependent left pleural effusion. 2. Mildly dilated central small bowel loop with air-fluid level, cannot exclude partial mid to distal small bowel obstruction. 3. Moderate colorectal stool volume, suggesting constipation. Electronically Signed   By: Ilona Sorrel M.D.   On: 03/12/2018 02:55    EKG: Independently reviewed. Sinus bradycardia.   Assessment/Plan Active Problems:   Cancer related pain   Metastatic pancreatic cancer -with worsening intraabdominal tumor burden, now bilateral ureteral obstruction -d/w urology, Dr. Tresa Moore, appreciate input, will see in consultation. Patient is DNR however feels like if he can get help with his kidneys not becoming completely obstructed he would want so -no fevers, no clinical or lab evidence of infection. -will send a  UA -d/w hospice liason RN who is at bedside, they will continue to follow  Cancer related pain -continue scheduled MSContin, oxy prn alternate with IV dilaudid prn -suspect bilateral hydro contributes to his worsening pain  Nausea/vomiting -due to above, prn zofran, allow a soft diet as tolerated   DVT prophylaxis: SCDs until eval by Urology  Code Status: DNR  Family Communication: no family at bedside Disposition Plan: TBD Consults called: Urology     Admission status: Observation   At the point of initial evaluation, it is my clinical opinion that admission for OBSERVATION is reasonable and necessary because the patient's presenting complaints in the context of their chronic conditions represent sufficient risk of deterioration or significant morbidity to constitute reasonable grounds for close observation in the hospital setting, but that the patient may be medically stable for discharge from the hospital within 24 to 48 hours.    Marzetta Board, MD [facility] Pager [phone]  If 7PM-7AM, please contact night-coverage [email or web address] Password [facility]  03/12/2018, 9:54 AM

## 2018-03-12 NOTE — ED Triage Notes (Signed)
Patient is from home and transported via [facility]. Patient has pancreatic cancer and a patient with hospice. Patient is complaining of mid abd pain with nausea and vomiting. Symptoms started about 4-5 days ago but got worse in the last the 2 days. Yesterday, patient vomited 9 times. He is has been unable to tolerate and keep his pain medication down due to the vomiting. EMS officered to initiate an IV and IV fluids but patient declined. He wanted to wait till he was at the hospital.

## 2018-03-12 NOTE — ED Notes (Signed)
Patient being transported to radiology.

## 2018-03-12 NOTE — Progress Notes (Signed)
[facility])  ED Visit  Met with patient at the bedside, MD present.  Pt upset about having to come to the ED, felt like his symptoms were not being properly managed at home.  He reports 10/10 abdominal pain that started last night, vomiting 10 times as well, unrelieved by any of his medications at home.   Results from ct scan reviewed with pt by the MD, which showed bilateral ureteral obstruction with hydronephrosis and hydroureter.  Patient advised that he wanted any treatment necessary to help him with his pain.  MD plans to admit to obs status.  Discussed with [facility] SW and RN  Attempted to update sister by phone, left message for her  Please call with any hospice related questions or concerns  Thank you, Venia Carbon BSN, [facility] Liaison [phone]  [facility] Liaisons are listed in [facility]

## 2018-03-12 NOTE — ED Provider Notes (Signed)
[facility] DEPT Provider Note   CSN: [id number] Arrival date & time: 03/12/18  0038     History   Chief Complaint Chief Complaint  Patient presents with  . Abdominal Pain    HPI Jon Pena is a 67 y.o. male.  Patient with metastatic pancreatic cancer status post pancreatectomy, splenectomy, radiation and chemotherapy but none currently presenting with worsening abdominal pain for the past 4 to 5 days.  Patient states he had severe abdominal pain tonight had to take his morphine dose early.  He then had several episodes of nausea and vomiting about 8 or 9 times.  Emesis was nonbloody and nonbilious.  He has not been able to tolerate his medications today due to vomiting and nausea.  Denies fever.  Patient is in hospice by his report and there is no further plan for surgery or chemotherapy.  He states he does have pain on a daily basis but this is progressively worsening. Having ongoing left testicular pain as well and saw his oncologist as well as surgery and told it was not a hernia.  The history is provided by the patient.  Abdominal Pain   Associated symptoms include nausea and vomiting. Pertinent negatives include fever, dysuria, hematuria, headaches, arthralgias and myalgias.    Past Medical History:  Diagnosis Date  . GERD (gastroesophageal reflux disease)   . HOH (hard of hearing)   . Hyperlipidemia   . Pancreatic adenocarcinoma ([place]) 02/27/13   Stage IIB (T3 N1) status post distal pancreatectomy/splenectomy, status post chemotherapy and XRT  . Pancreatitis   . Pre-diabetes     Patient Active Problem List   Diagnosis Date Noted  . Abdominal pain, epigastric 10/11/2017  . Diarrhea 10/11/2017  . Abdominal mass   . Muscle weakness 01/11/2017  . Coronary atherosclerosis 11/11/2013  . Malignant neoplasm of tail of pancreas ([place]) 03/14/2013  . Malignant neoplasm of pancreas ([place]) 03/13/2013    Past Surgical History:  Procedure  Laterality Date  . APPENDECTOMY    . BIOPSY  10/15/2017   Procedure: BIOPSY;  Surgeon: Rogene Houston, MD;  [facility];  Service: Endoscopy;;  duodenum gastric colon  . COLONOSCOPY    . COLONOSCOPY N/A 10/15/2017   Procedure: COLONOSCOPY;  Surgeon: Rogene Houston, MD;  [facility];  Service: Endoscopy;  Laterality: N/A;  . ESOPHAGOGASTRODUODENOSCOPY N/A 10/15/2017   Procedure: ESOPHAGOGASTRODUODENOSCOPY (EGD);  Surgeon: Rogene Houston, MD;  [facility];  Service: Endoscopy;  Laterality: N/A;  12:30  . EUS N/A 02/20/2013   Procedure: UPPER ENDOSCOPIC ULTRASOUND (EUS) LINEAR;  Surgeon: Milus Banister, MD;  Location[facility];  Service: Endoscopy;  Laterality: N/A;  . EUS N/A 02/01/2017   Procedure: UPPER ENDOSCOPIC ULTRASOUND (EUS) LINEAR;  Surgeon: Milus Banister, MD;  [facility];  Service: Endoscopy;  Laterality: N/A;  . LAPAROSCOPIC APPENDECTOMY N/A 01/26/2013   Procedure: APPENDECTOMY LAPAROSCOPIC;  Surgeon: Zenovia Jarred, MD;  Location: [facility];  Service: General;  Laterality: N/A;  . LAPAROSCOPIC SPLENECTOMY N/A 02/27/2013   Procedure: LAPAROSCOPIC SPLENECTOMY;  Surgeon: Stark Klein, MD;  Location: [place];  Service: General;  Laterality: N/A;  . PANCREATECTOMY N/A 02/27/2013   Procedure: LAPAROSCOPIC PANCREATECTOMY;  Surgeon: Stark Klein, MD;  Location: [place];  Service: General;  Laterality: N/A;  . POLYPECTOMY    . PORT-A-CATH REMOVAL Left 08/26/2013   Procedure: REMOVAL PORT-A-CATH;  Surgeon: Stark Klein, MD;  Location: [place];  Service: General;  Laterality:  Left;  . PORTACATH PLACEMENT N/A 03/21/2013   Procedure: INSERTION PORT-A-CATH;  Surgeon: Stark Klein, MD;  Location: [place];  Service: General;  Laterality: N/A;  . TONSILLECTOMY          Home Medications    Prior to Admission medications   Medication Sig Start Date End Date Taking? Authorizing Provider  aspirin EC 81 MG tablet  Take 1 tablet (81 mg total) by mouth every other day. 10/18/17   Rogene Houston, MD  cholecalciferol (VITAMIN D) 1000 units tablet Take 1,000 Units by mouth daily.    [provider]  Cyanocobalamin (B-12) 1000 MCG/ML KIT Inject 1 mL as directed every 30 (thirty) days.    [provider]  diphenoxylate-atropine (LOMOTIL) 2.5-0.025 MG tablet Take 1 tablet by mouth 4 (four) times daily as needed for diarrhea or loose stools. 11/23/17   Ladell Pier, MD  famotidine (PEPCID) 10 MG tablet Take 10 mg by mouth 2 (two) times daily as needed for heartburn.     [provider]  gabapentin (NEURONTIN) 100 MG capsule Take 1 capsule (100 mg total) by mouth 3 (three) times daily. 01/08/18   Ladell Pier, MD  HYDROcodone-acetaminophen (NORCO/VICODIN) 5-325 MG tablet TAKE ONE TABLET EVERY FOUR HOURS AS NEEDED FOR MODERATE PAIN 12/19/17   Ladell Pier, MD  lipase/protease/amylase (CREON) 36000 UNITS CPEP capsule Take 1-4 capsules (36,000-144,000 Units total) by mouth See admin instructions. Take 4 caps with each meal and one cap with snacks. Patient taking differently: Take 36,000-144,000 Units by mouth See admin instructions. Take 1 caps with each meal and one cap with snacks. 10/23/17   Rehman, Mechele Dawley, MD  meloxicam (MOBIC) 15 MG tablet Take 1 tablet (15 mg total) by mouth daily. 12/10/17   Ladell Pier, MD  Multiple Vitamins-Minerals (MULTIVITAMIN WITH MINERALS) tablet Take 1 tablet by mouth daily.    [provider]  ondansetron (ZOFRAN-ODT) 4 MG disintegrating tablet Take 1 tablet (4 mg total) by mouth every 6 (six) hours as needed for nausea or vomiting. 02/13/18   Ladell Pier, MD  oxyCODONE-acetaminophen (PERCOCET/ROXICET) 5-325 MG tablet Take 1-2 tablets by mouth every 4 (four) hours as needed (for pain not relieved by hydrocodone). 02/13/18   Ladell Pier, MD  pantoprazole (PROTONIX) 40 MG tablet Take 1 tablet (40 mg total) by mouth daily. 07/09/17   Setzer,  Rona Ravens, NP  Polyethyl Glycol-Propyl Glycol (LUBRICANT EYE DROPS) 0.4-0.3 % SOLN Place 1 drop into both eyes 3 (three) times daily as needed (for dry/irritated eyes.).    [provider]    Family History Family History  Problem Relation Age of Onset  . Pancreatitis Father   . Breast cancer Paternal Aunt   . Colon cancer Neg Hx   . Rectal cancer Neg Hx   . Stomach cancer Neg Hx     Social History Social History   Tobacco Use  . Smoking status: Current Every Day Smoker    Packs/day: 0.50    Years: 20.00    Pack years: 10.00    Types: Cigarettes  . Smokeless tobacco: Never Used  Substance Use Topics  . Alcohol use: Not Currently  . Drug use: Not Currently     Allergies   Patient has no known allergies.   Review of Systems Review of Systems  Constitutional: Positive for activity change, appetite change and fatigue. Negative for fever.  HENT: Negative for congestion and rhinorrhea.   Respiratory: Negative for cough, chest  tightness, shortness of breath and wheezing.   Cardiovascular: Negative for chest pain.  Gastrointestinal: Positive for abdominal pain, nausea and vomiting.  Genitourinary: Positive for testicular pain. Negative for dysuria and hematuria.  Musculoskeletal: Negative for arthralgias and myalgias.  Skin: Negative for rash.  Neurological: Positive for weakness. Negative for dizziness, light-headedness and headaches.   all other systems are negative except as noted in the HPI and PMH.     Physical Exam Updated Vital Signs BP (!) 148/85 (BP Location: Left Arm)   Pulse (!) 50   Temp 98.1 F (36.7 C) (Oral)   Resp 17   Ht 5' 10.5" (1.791 m)   Wt 54 kg   SpO2 99%   BMI 16.83 kg/m   Physical Exam  Constitutional: He is oriented to person, place, and time. He appears well-developed and well-nourished. No distress.  Chronically ill appearing  HENT:  Head: Normocephalic and atraumatic.  Mouth/Throat: Oropharynx is clear and moist. No  oropharyngeal exudate.  Eyes: Pupils are equal, round, and reactive to light. Conjunctivae and EOM are normal.  Neck: Normal range of motion. Neck supple.  No meningismus.  Cardiovascular: Normal rate, regular rhythm, normal heart sounds and intact distal pulses.  No murmur heard. Pulmonary/Chest: Effort normal and breath sounds normal. No respiratory distress. He exhibits no tenderness.  Abdominal: Soft. There is tenderness. There is guarding. There is no rebound.  Mild diffuse tenderness with voluntary guarding  Genitourinary:  Genitourinary Comments: Left testicle mildly tender and enlarged compared to right.  Musculoskeletal: Normal range of motion. He exhibits no edema or tenderness.  Neurological: He is alert and oriented to person, place, and time. No cranial nerve deficit. He exhibits normal muscle tone. Coordination normal.  No ataxia on finger to nose bilaterally. No pronator drift. 5/5 strength throughout. CN 2-12 intact.Equal grip strength. Sensation intact.   Skin: Skin is warm. Capillary refill takes less than 2 seconds.  Psychiatric: He has a normal mood and affect. His behavior is normal.  Nursing note and vitals reviewed.    ED Treatments / Results  Labs (all labs ordered are listed, but only abnormal results are displayed) Labs Reviewed  CBC WITH DIFFERENTIAL/PLATELET - Abnormal; Notable for the following components:      Result Value   RBC 4.09 (*)    Hemoglobin 12.9 (*)    HCT 37.7 (*)    All other components within normal limits  COMPREHENSIVE METABOLIC PANEL - Abnormal; Notable for the following components:   Chloride 97 (*)    Glucose, Bld 124 (*)    All other components within normal limits  LIPASE, BLOOD  TROPONIN I    EKG EKG Interpretation  Date/Time:  Tuesday March 12 2018 01:24:59 EDT Ventricular Rate:  47 PR Interval:    QRS Duration: 91 QT Interval:  455 QTC Calculation: 403 R Axis:   35 Text Interpretation:  Sinus bradycardia Low  voltage, precordial leads Baseline wander in lead(s) V3 No previous ECGs available Confirmed by Ezequiel Essex [phone]) on 03/12/2018 1:27:56 AM   Radiology Dg Abdomen Acute W/chest  Result Date: 03/12/2018 CLINICAL DATA:  Metastatic pancreatic cancer.  Vomiting and nausea. EXAM: DG ABDOMEN ACUTE W/ 1V CHEST COMPARISON:  03/21/2013 chest radiograph. FINDINGS: Stable cardiomediastinal silhouette with normal heart size. No pneumothorax. Small dependent left pleural effusion. No right pleural effusion. No pulmonary edema. No acute consolidative airspace disease. Mildly dilated central small bowel loop with air-fluid level. Moderate colorectal stool volume. No evidence of pneumatosis or pneumoperitoneum. No radiopaque nephrolithiasis.  IMPRESSION: 1. Small dependent left pleural effusion. 2. Mildly dilated central small bowel loop with air-fluid level, cannot exclude partial mid to distal small bowel obstruction. 3. Moderate colorectal stool volume, suggesting constipation. Electronically Signed   By: Ilona Sorrel M.D.   On: 03/12/2018 02:55    Procedures Procedures (including critical care time)  Medications Ordered in ED Medications  sodium chloride 0.9 % bolus 1,000 mL (has no administration in time range)  HYDROmorphone (DILAUDID) injection 1 mg (has no administration in time range)  ondansetron (ZOFRAN) injection 4 mg (has no administration in time range)     Initial Impression / Assessment and Plan / ED Course  I have reviewed the triage vital signs and the nursing notes.  Pertinent labs & imaging results that were available during my care of the patient were reviewed by me and considered in my medical decision making (see chart for details).    Patient with metastatic pancreatic cancer presenting with abdominal pain and vomiting. Abdomen soft, diffusely tender,  CT scan from August 30 shows extensive metastatic disease as well as vascular involvement.  Labs appear to be at patient's  baseline.  Creatinine is normal.  X-ray shows constipation as well as dilated bowel loop concerning for possible partial obstruction.  Will proceed with CT scan.  Patient given IV fluids and symptom control.  Awaiting CT scan at signout. Concern for likely small bowel obstruction given extensive metastatic disease.   Suspect probable admission for small bowel obstruction and discussion with oncology regarding vascular involvement of tumor burden.  D/w Dr. Alvy Bimler on call for Dr. Benay Spice who is unavailable this week.  She states anticoagulation is physician dependent and will need careful weighing of risks and benefits.  She would not recommend in this patient's case given his extensive metastatic disease and hospice status.  He could be at risk for bleeding as well as liver dysfunction.  She states she is happy to consult on patient if  hospitalist desires. Dr. Kathrynn Humble to assume care at shift change.  Final Clinical Impressions(s) / ED Diagnoses   Final diagnoses:  Metastatic cancer [facility])    ED Discharge Orders    None       Roe Wilner, Annie Main, MD 03/12/18 [phone]

## 2018-03-12 NOTE — ED Notes (Addendum)
Pt returned from CT and requesting pain medication.  Made Dr Wyvonnia Dusky aware. New orders placed.

## 2018-03-13 DIAGNOSIS — Z79891 Long term (current) use of opiate analgesic: Secondary | ICD-10-CM | POA: Diagnosis not present

## 2018-03-13 DIAGNOSIS — Z7982 Long term (current) use of aspirin: Secondary | ICD-10-CM | POA: Diagnosis not present

## 2018-03-13 DIAGNOSIS — K5900 Constipation, unspecified: Secondary | ICD-10-CM | POA: Diagnosis present

## 2018-03-13 DIAGNOSIS — Z66 Do not resuscitate: Secondary | ICD-10-CM | POA: Diagnosis present

## 2018-03-13 DIAGNOSIS — Z923 Personal history of irradiation: Secondary | ICD-10-CM | POA: Diagnosis not present

## 2018-03-13 DIAGNOSIS — C799 Secondary malignant neoplasm of unspecified site: Secondary | ICD-10-CM | POA: Diagnosis not present

## 2018-03-13 DIAGNOSIS — H9190 Unspecified hearing loss, unspecified ear: Secondary | ICD-10-CM | POA: Diagnosis present

## 2018-03-13 DIAGNOSIS — D630 Anemia in neoplastic disease: Secondary | ICD-10-CM | POA: Diagnosis present

## 2018-03-13 DIAGNOSIS — G893 Neoplasm related pain (acute) (chronic): Secondary | ICD-10-CM | POA: Diagnosis present

## 2018-03-13 DIAGNOSIS — Z681 Body mass index (BMI) 19 or less, adult: Secondary | ICD-10-CM | POA: Diagnosis not present

## 2018-03-13 DIAGNOSIS — Z791 Long term (current) use of non-steroidal anti-inflammatories (NSAID): Secondary | ICD-10-CM | POA: Diagnosis not present

## 2018-03-13 DIAGNOSIS — R1084 Generalized abdominal pain: Secondary | ICD-10-CM | POA: Diagnosis not present

## 2018-03-13 DIAGNOSIS — N132 Hydronephrosis with renal and ureteral calculous obstruction: Secondary | ICD-10-CM | POA: Diagnosis present

## 2018-03-13 DIAGNOSIS — E43 Unspecified severe protein-calorie malnutrition: Secondary | ICD-10-CM | POA: Diagnosis present

## 2018-03-13 DIAGNOSIS — E785 Hyperlipidemia, unspecified: Secondary | ICD-10-CM | POA: Diagnosis present

## 2018-03-13 DIAGNOSIS — C259 Malignant neoplasm of pancreas, unspecified: Secondary | ICD-10-CM | POA: Diagnosis not present

## 2018-03-13 DIAGNOSIS — R197 Diarrhea, unspecified: Secondary | ICD-10-CM | POA: Diagnosis not present

## 2018-03-13 DIAGNOSIS — Z515 Encounter for palliative care: Secondary | ICD-10-CM | POA: Diagnosis present

## 2018-03-13 DIAGNOSIS — Z79899 Other long term (current) drug therapy: Secondary | ICD-10-CM | POA: Diagnosis not present

## 2018-03-13 DIAGNOSIS — C787 Secondary malignant neoplasm of liver and intrahepatic bile duct: Secondary | ICD-10-CM | POA: Diagnosis not present

## 2018-03-13 DIAGNOSIS — C786 Secondary malignant neoplasm of retroperitoneum and peritoneum: Secondary | ICD-10-CM | POA: Diagnosis not present

## 2018-03-13 DIAGNOSIS — F17210 Nicotine dependence, cigarettes, uncomplicated: Secondary | ICD-10-CM | POA: Diagnosis present

## 2018-03-13 DIAGNOSIS — C7800 Secondary malignant neoplasm of unspecified lung: Secondary | ICD-10-CM | POA: Diagnosis not present

## 2018-03-13 DIAGNOSIS — Z9221 Personal history of antineoplastic chemotherapy: Secondary | ICD-10-CM | POA: Diagnosis not present

## 2018-03-13 DIAGNOSIS — K219 Gastro-esophageal reflux disease without esophagitis: Secondary | ICD-10-CM | POA: Diagnosis present

## 2018-03-13 LAB — CBC
HEMATOCRIT: 39.3 % (ref 39.0–52.0)
HEMOGLOBIN: 13.3 g/dL (ref 13.0–17.0)
MCH: 31.6 pg (ref 26.0–34.0)
MCHC: 33.8 g/dL (ref 30.0–36.0)
MCV: 93.3 fL (ref 78.0–100.0)
Platelets: 274 10*3/uL (ref 150–400)
RBC: 4.21 MIL/uL — AB (ref 4.22–5.81)
RDW: 13.6 % (ref 11.5–15.5)
WBC: 6.3 10*3/uL (ref 4.0–10.5)

## 2018-03-13 LAB — COMPREHENSIVE METABOLIC PANEL
ALBUMIN: 3.1 g/dL — AB (ref 3.5–5.0)
ALT: 10 U/L (ref 0–44)
AST: 15 U/L (ref 15–41)
Alkaline Phosphatase: 103 U/L (ref 38–126)
Anion gap: 8 (ref 5–15)
BILIRUBIN TOTAL: 0.8 mg/dL (ref 0.3–1.2)
BUN: 19 mg/dL (ref 8–23)
CHLORIDE: 99 mmol/L (ref 98–111)
CO2: 26 mmol/L (ref 22–32)
Calcium: 8.8 mg/dL — ABNORMAL LOW (ref 8.9–10.3)
Creatinine, Ser: 0.99 mg/dL (ref 0.61–1.24)
GFR calc Af Amer: >60 mL/min (ref >60)
GFR calc non Af Amer: >60 mL/min (ref >60)
Glucose, Bld: 89 mg/dL (ref 70–99)
POTASSIUM: 4 mmol/L (ref 3.5–5.1)
Sodium: 133 mmol/L — ABNORMAL LOW (ref 135–145)
TOTAL PROTEIN: 5.7 g/dL — AB (ref 6.5–8.1)

## 2018-03-13 LAB — HIV ANTIBODY (ROUTINE TESTING W REFLEX): HIV Screen 4th Generation wRfx: NONREACTIVE

## 2018-03-13 NOTE — Progress Notes (Signed)
[facility] ([facility])  GIP RN Visit  This is a related and covered GIP admission on 9/24 with [facility] diagnosis of carcinoma of the pancreas, per Dr. Lyman Speller.  Patient has an [facility] DNR.   Patient was having pain that he could not be managed at home. He did call triage and was advised that a nurse would be at his house in 20 minutes, but he advised he could not wait any longer.  He activated EMS and was transported to [facility] ED and admitted.   This is day 2 of [facility] GIP.   Spoke with patient at bedside. Family friend also present. Patient is alert and oriented. Does not appear in distress. He states his pain is better controlled with IV Dilaudid. Patient states he wants to go to [facility] for end of life care. He is adamant he does not want to die at home. Patient state he has declined to have nephrostomy tube placed.   [facility] notified of Patient's desire to transfer.   Medications: NS@75ml /hr, Morphine MS Contin 15mg  12hr tablet BID,  Protonix EC 40mg  QD Dilaudid 1mg  IV PRN given at [place], [place], 0809. Zofran 4mg  PO PRN -none given today  Relevant MD notes: Assessment/Plan: 1 - Bilateral Ureteral Obstruction in Setting of Progressive Metastatic Cancer - we discussed natural history of bilateral obstruction with likely progressive renal failure, possibly leading to uremia and lethality over period of likely several weeks v. Aggressive path with bilateral neph tubes to relive obstruction. He would not be surgical candidate. He adamantly wants NO INTERVENTION for his obstruction.  2 - End of Life Care - pt's primary goal is pain control and comfort care. He desires DC to inpatient hospice / [facility] if possible. His level of care and pain control requirements becoming too taxing even for home hospice. I agree completely that this would be optimal if logistically possible.  Goals of care: Comfort care only with desire to transfer to [facility] for end of life  care.  Communication to [facility]: Updated [facility] team and [facility].   Transfer summary, and medication list placed in the shadow chart.  Should transport be needed, please use [facility] as they contract with [facility].  Please call with any hospice related questions/concerns  Thank you,  Farrel Gordon, RN, [facility] Liaison [phone]  [facility] Liaisons are listed in [facility]

## 2018-03-13 NOTE — Progress Notes (Signed)
CSW met with pt to discuss disposition, pt explaining he had [facility] care at home PTA. Declines interest in any other hospice agency at this time for his care. Has discussed with them and attending plan to transfer to [facility].  Pt spent some time speaking with CSW about his past career, close friends, and meaningful relationships and hobbies he has had in the past.  States he is looking forward to friends nearby being able to spend time with his visiting at [facility]. GIP admission, CSW will assist with disposition as needed.  Sharren Bridge, MSW, LCSW Clinical Social Work 03/13/2018 [phone]

## 2018-03-13 NOTE — Progress Notes (Signed)
[facility] PROGRESS NOTE  Jon Pena FAO:[id number] DOB: 1950-11-12 DOA: 03/12/2018 PCP: Sharilyn Sites, MD   Narrative: 67 year old male Metastatic pancreatic cancer on home hospice admitted with intractable abdominal pain-Home regimen ineffective-10/10 pain on oral meds but unable to take medication 10 episodes of emesis   A & Plan  Metastatic pancreatic cancer  Right ureteric stone R 6 mm + hydro--, left malignant obstruction + UVJ obstruction secondary to metastatic pancreatic mass-offered options by urologist-does not wish to intervene on the same  Intractable nausea vomiting, cancer related pain-currently on IV meds-getting Dilaudid and pain is 6/10-continue regimen-agreeable to placement at [facility] as he lives alone at home with 71 something-year-old mother who he takes care of  Not a safe discharge to home and we will await bed availability at [facility] as per discussions   DVT prophylaxis: Lovenox c  Code Status: DNAR   Family Communication: n   Disposition Plan: [facility] on discharge   [name], MD  [facility] Direct contact: [phone] --[place]  --[email or web address]; password [email or web address]  7PM-7AM contact night coverage as above 03/13/2018, 1:18 PM  LOS: 0 days   Consultants:  Palliative care  Procedures:  No  Antimicrobials:  None  Interval history/Subjective: Awake alert  Objective:  Vitals:  Vitals:   03/12/18 2010 03/13/18 0405  BP: (!) 158/89 (!) 146/78  Pulse: 61 74  Resp: 16 14  Temp: 97.9 F (36.6 C) 97.9 F (36.6 C)  SpO2: 100% 100%    Exam:  . Awake alert asthenic Caucasian male no apparent distress . No icterus no pallor . Chest is clear . Abdomen is slightly tender with some distention . Neurologically intact moving all 4 limbs . Lucid . Power 5/5, reflexes deferred sensory deferred   I have personally reviewed the following:   Labs:  Labs reviewed only anomaly is BUN/creatinine  19/0.9 sodium 133  Imaging studies:  None  Medical tests:  No  Test discussed with performing physician:  No  Decision to obtain old records:  No  Review and summation of old records:  y/o ?    Scheduled Meds: . lipase/protease/amylase  36,000 Units Oral TID WC   And  . lipase/protease/amylase  36,000 Units Oral TID BM  . morphine  15 mg Oral Q12H  . pantoprazole  40 mg Oral Daily   Continuous Infusions: . sodium chloride 75 mL/hr at 03/13/18 0321    Active Problems:   Cancer related pain   LOS: 0 days

## 2018-03-13 NOTE — Progress Notes (Signed)
Nutrition Brief Note  Patient identified via MST screening.  Chart reviewed. Pt now transitioning to comfort care.  No nutrition interventions warranted at this time.    Clayton Bibles, MS, RD, [place] Dietitian Pager: [phone] After Hours Pager: [phone]

## 2018-03-13 NOTE — Progress Notes (Signed)
[facility] Work note LCSW met with patient to address discharge plans and his desire for discharge to [facility]. Patient shares an understanding that he is dying and cannot manage at home. He signed documents and wants [facility]. Support, education offered. Katherina Right, [place]

## 2018-03-14 ENCOUNTER — Inpatient Hospital Stay: Payer: Self-pay

## 2018-03-14 MED ORDER — HYDROMORPHONE HCL 1 MG/ML IJ SOLN: 1.0000 mg | INTRAMUSCULAR | 0 refills | Status: AC | PRN

## 2018-03-14 MED ORDER — ONDANSETRON HCL 4 MG/2ML IJ SOLN: 4.0000 mg | Freq: Four times a day (QID) | INTRAMUSCULAR | 0 refills | Status: AC | PRN

## 2018-03-14 MED ORDER — SODIUM CHLORIDE 0.9% FLUSH: 10.0000 mL | INTRAVENOUS | Status: DC | PRN

## 2018-03-14 MED ORDER — MORPHINE SULFATE ER 15 MG PO TBCR: 15.0000 mg | EXTENDED_RELEASE_TABLET | Freq: Two times a day (BID) | ORAL | 0 refills | Status: AC

## 2018-03-14 NOTE — Progress Notes (Signed)
Pt admitting to [facility] upon DC today once PICC placed. Pt's transporting via friend's private vehicle. Report # for RN [phone].  Sharren Bridge, MSW, LCSW Clinical Social Work 03/14/2018 [phone]

## 2018-03-14 NOTE — Progress Notes (Signed)
Peripherally Inserted Central Catheter/Midline Placement  The IV Nurse has discussed with the patient and/or persons authorized to consent for the patient, the purpose of this procedure and the potential benefits and risks involved with this procedure.  The benefits include less needle sticks, lab draws from the catheter, and the patient may be discharged home with the catheter. Risks include, but not limited to, infection, bleeding, blood clot (thrombus formation), and puncture of an artery; nerve damage and irregular heartbeat and possibility to perform a PICC exchange if needed/ordered by physician.  Alternatives to this procedure were also discussed.  Bard Power PICC patient education guide, fact sheet on infection prevention and patient information card has been provided to patient /or left at bedside.    PICC/Midline Placement Documentation  PICC Single Lumen 83/37/44 PICC Right Basilic 35 cm 0 cm (Active)  Indication for Insertion or Continuance of Line Home intravenous therapies (PICC only) 03/14/2018  3:53 PM  Exposed Catheter (cm) 0 cm 03/14/2018  3:53 PM  Site Assessment Clean;Dry;Intact 03/14/2018  3:53 PM  Line Status Flushed;Saline locked;Blood return noted 03/14/2018  3:53 PM  Dressing Type Transparent;Securing device 03/14/2018  3:53 PM  Dressing Status Clean;Dry;Intact;Antimicrobial disc in place 03/14/2018  3:53 PM  Dressing Change Due 03/21/18 03/14/2018  3:53 PM       Frances Maywood 03/14/2018, 3:56 PM

## 2018-03-14 NOTE — Discharge Summary (Signed)
Physician Discharge Summary  Jon Pena WPY:[id number] DOB: 1950-10-12 DOA: 03/12/2018  PCP: Sharilyn Sites, MD  Admit date: 03/12/2018 Discharge date: 03/14/2018  Time spent: 35 minutes  Recommendations for Outpatient Follow-up:  1. Patient will be discharging to [facility] for end-of-life management and care  Discharge Diagnoses:  Active Problems:   Cancer related pain   Discharge Condition: Guarded  Diet recommendation: Liberalize  Filed Weights   03/12/18 0053  Weight: 54 kg    History of present illness:  67 year old male metastatic pancreatic cancer home hospice admitted 9/24 with intractable nausea vomiting 10 on 10 abdominal pain Found to have right ureteric stone and malignant obstruction urology consulted He was started on IV Dilaudid and Percocet and IV antinausea regimen which seemed to help well-he is unable to return to his current environment at home because he has a 74 year old mother who needs taking care of and because of his intractable symptoms he will need hospice level management and care  Consultations:  Hospice  Discharge Exam: Vitals:   03/13/18 2142 03/14/18 0500  BP: (!) 142/88 138/79  Pulse: 68 70  Resp: 15 16  Temp: 98.1 F (36.7 C) 98.4 F (36.9 C)  SpO2: 100% 100%    General: EOMI NCAT by temporalis wasting supraclavicular wasting arcus senilis no pallor no icterus Chest is clear Abdomen is soft tender in the epigastrium S1-S2 no murmur Neurologically intact although slightly drowsy  Discharge Instructions   Discharge Instructions    Diet - low sodium heart healthy   Complete by:  As directed    Increase activity slowly   Complete by:  As directed      Allergies as of 03/14/2018   No Known Allergies     Medication List    STOP taking these medications   aspirin EC 81 MG tablet   HYDROcodone-acetaminophen 5-325 MG tablet Commonly known as:  NORCO/VICODIN   meloxicam 15 MG tablet Commonly known as:  MOBIC    oxyCODONE-acetaminophen 5-325 MG tablet Commonly known as:  PERCOCET/ROXICET     TAKE these medications   diphenoxylate-atropine 2.5-0.025 MG tablet Commonly known as:  LOMOTIL Take 1 tablet by mouth 4 (four) times daily as needed for diarrhea or loose stools.   famotidine 10 MG tablet Commonly known as:  PEPCID Take 10 mg by mouth 2 (two) times daily as needed for heartburn.   gabapentin 100 MG capsule Commonly known as:  NEURONTIN Take 1 capsule (100 mg total) by mouth 3 (three) times daily. What changed:    when to take this  reasons to take this   HYDROmorphone 1 MG/ML injection Commonly known as:  DILAUDID Inject 1 mL (1 mg total) into the vein every 4 (four) hours as needed for severe pain.   lipase/protease/amylase 36000 UNITS Cpep capsule Commonly known as:  CREON Take 1-4 capsules (36,000-144,000 Units total) by mouth See admin instructions. Take 4 caps with each meal and one cap with snacks. What changed:    how much to take  additional instructions   LUBRICANT EYE DROPS 0.4-0.3 % Soln Generic drug:  Polyethyl Glycol-Propyl Glycol Place 1 drop into both eyes 3 (three) times daily as needed (for dry/irritated eyes.).   morphine 15 MG 12 hr tablet Commonly known as:  MS CONTIN Take 1 tablet (15 mg total) by mouth every 12 (twelve) hours.   ondansetron 4 MG disintegrating tablet Commonly known as:  ZOFRAN-ODT Take 1 tablet (4 mg total) by mouth every 6 (six) hours as needed for  nausea or vomiting.   ondansetron 4 MG/2ML Soln injection Commonly known as:  ZOFRAN Inject 2 mLs (4 mg total) into the vein every 6 (six) hours as needed for nausea.   pantoprazole 40 MG tablet Commonly known as:  PROTONIX Take 1 tablet (40 mg total) by mouth daily.   promethazine 25 MG tablet Commonly known as:  PHENERGAN Take 25 mg by mouth every 6 (six) hours as needed for nausea or vomiting.      No Known Allergies    The results of significant diagnostics from  this hospitalization (including imaging, microbiology, ancillary and laboratory) are listed below for reference.    Significant Diagnostic Studies: Ct Abdomen Pelvis W Contrast  Result Date: 03/12/2018 CLINICAL DATA:  Mid abdominal pain with nausea and vomiting. Hospice care for pancreatic cancer. EXAM: CT ABDOMEN AND PELVIS WITH CONTRAST TECHNIQUE: Multidetector CT imaging of the abdomen and pelvis was performed using the standard protocol following bolus administration of intravenous contrast. CONTRAST:  96mL ISOVUE-300 IOPAMIDOL (ISOVUE-300) INJECTION 61% COMPARISON:  02/15/2018 FINDINGS: Lower chest: Moderate left pleural effusion with passive atelectasis and additional mild bibasilar subsegmental atelectasis. Hepatobiliary: Periportal edema. Previously seen tumor along the left hepatic lobe margin is less sharply defined than on the prior exam but probably still present. The metastatic lesion peripherally in the right hepatic lobe shown on the prior exam is less well seen on today's exam. Pancreas: Notable tumor along the expected location of the pancreatic tail, infiltrating around adjacent vascular structures, the left adrenal gland, and the retroperitoneum as before. Spleen: Splenectomy Adrenals/Urinary Tract: The left adrenal gland is partially encased in tumor, as before. There is a 2.3 by 1.9 cm left kidney upper pole enhancing mass compatible with renal cell carcinoma or a metastatic lesion. Delayed enhancement of the left kidney with left hydronephrosis and hydroureter observed. 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 There is tumor around the proximal ureters extending down along the retroperitoneum to the iliac vessel cross over, probably obstructing the ureters. This is mildly worsened compared to the prior exam. There is new moderate right hydronephrosis and right hydroureter due to a 6 by 4 mm right proximal ureteral calculus just past the UPJ on image 38/2.  Stomach/Bowel: Ill-defined bowel. Tumor in the lesser sac region abutting the stomach, as before. No dilated bowel. Vascular/Lymphatic: Tumor encases the celiac trunk, superior mesenteric artery, left renal artery, and upper abdominal aorta. Tumor abuts but does not totally encase the right renal artery. Ill definition of segments of the portal vein in the vicinity of the tumor. Aortoiliac atherosclerotic vascular disease. Reproductive: Mild prostatomegaly with central calcifications. Other: Diffuse subcutaneous and mesenteric edema. Mild ascites, increased from prior. Musculoskeletal: 2.7 by 2.0 cm sclerotic lesion eccentric to the left in the L2 vertebra, likely metastatic. 5 mm of degenerative anterolisthesis at L4-5. Impingement at L4-5. Ill-defined tumor in the left rectus abdominus muscle, as before. IMPRESSION: 1. Bilateral ureteral obstruction. On the left the patient is a duplicated collecting system with both proximal ureters obstructive by tumor. On the right the patient has a 6 mm proximal ureteral calculus causing hydronephrosis and hydroureter. The severity of obstruction is more complete on the left. 2. There is diffuse third spacing of fluid with subcutaneous and mesenteric edema as well as ascites. Left pleural effusion. 3. Ill-defined tumor along the pancreas extending into the lesser sac, encasing the left adrenal gland, and encasing upper abdominal vascular structures including the celiac trunk, SMA, aorta, renal veins, and portal vein. Patency of  the portal vein is indeterminate as portions of the portal vein are poorly seen. 4. 2.3 cm left kidney upper pole mass, renal cell carcinoma versus metastatic lesion. 5. 2.7 cm sclerotic lesion in the left L2 vertebra, likely metastatic. 6. Impingement at L4-5 due to spondylosis and degenerative disc disease. 7. Roughly stable tumor involvement of the left rectus abdominus muscle. Electronically Signed   By: Van Clines M.D.   On: 03/12/2018  08:24   Ct Abdomen Pelvis W Contrast  Result Date: 02/15/2018 CLINICAL DATA:  67 year old male with a history of upper abdominal pain EXAM: CT ABDOMEN AND PELVIS WITH CONTRAST TECHNIQUE: Multidetector CT imaging of the abdomen and pelvis was performed using the standard protocol following bolus administration of intravenous contrast. CONTRAST:  113mL ISOVUE-300 IOPAMIDOL (ISOVUE-300) INJECTION 61% COMPARISON:  10/09/2017, 06/18/2017, PET-CT 01/22/2017, 12/08/2016 FINDINGS: Lower chest: Left pleural effusion with associated atelectasis. Native coronary calcifications. Hepatobiliary: Mottled attenuation/enhancement throughout the liver parenchyma, new from the comparison. New 11 mm hypodense lesion on the lateral margin of the right liver, with delayed internal enhancement. Redemonstration of hypodense lesion of segment 2/3, which appears larger than the comparison CT study now measuring 31 mm compared 0 17 mm previously Pancreas: Redemonstration of pancreatic carcinoma involving the body/tail the pancreas. Compare to the prior CT there is increasing low-density soft tissue centered at the pancreas, with progressive centripetal involvement of the adjacent structures which include posterior wall of the stomach, the fat planes of the retroperitoneum at this level, and the adjacent vasculature. Celiac artery is attenuated just after the origin, with potential occlusion of the common hepatic artery. Arteries in the hilum of the liver maintained patency, potentially secondary to collateral flow. Spleen: Splenectomy Adrenals/Urinary Tract: Irregular thickening of the left adrenal gland with hyperenhancement. Right adrenal gland unremarkable. Right kidney within normal limits without hydronephrosis. Small benign cyst on the posterior cortex. Decreased attenuation/enhancement of the left kidney compared to the right secondary to compromise of the left renal artery by tumor involvement. Developing left-sided hydronephrosis  with dilated left ureter. Abnormal soft tissue surrounds the left ureter, which is matted to the anterior surface of the psoas muscle. Redemonstration irregularly enhancing left renal tumor on the posterior cortex measuring 2.2 cm on the current study. Stomach/Bowel: Mucosal enhancement of the stomach within normal limits. Inseparable soft tissue from the tumor of the pancreas from the posterior wall of the stomach, progressed from the comparison. Unremarkable appearance of the proximal duodenum. There is tumor inseparable from the serosa of the duodenum at the duodenal jejunal junction, best seen on the coronal images. No abnormal small bowel dilation or transition point. No colonic dilation. Colonic diverticula without evidence of acute diverticulitis. Increasing soft tissue in the left perinephric space and extending along the left pericolic gutter. Redemonstration of nodularity within the pelvis (image 55 of series 2. Small nodularity within the mesentery planes. Vascular/Lymphatic: Increasing soft tissue along the anterior aspect of the aorta near the pancreas. Soft tissue extends to the right aspect of the aorta along the anterior right renal artery. Soft tissue extends inferior along the left aspect of the aorta, contributing to attenuation/narrowing of the left renal artery. Soft tissue contributes to significant stenosis or occlusion of the celiac artery beyond the origin, near the common hepatic artery. Circumferential tissue of the superior mesenteric artery which is narrowed at the origin. IMA remains patent. Left renal vein is compromised, with collateral draining veins including gonadal vein which contributes to a left varicocele. Portal vein is patent at the  liver hilum, though the splenic vein is occluded. Reproductive: Calcifications of the prostate. Transverse diameter prostate measures 4.4 cm Other: Increasing soft tissue associated with the left inferior epigastric vasculature of the rectus  musculature. Low volume pelvic ascites Musculoskeletal: No acute displaced fracture. IMPRESSION: CT again demonstrates local progression and metastatic progression of pancreatic adenocarcinoma, including: -increasing tumor burden at the pancreas, with serosal involvement of the posterior stomach and fourth portion of the duodenum -progressive vascular involvement including the celiac artery/common hepatic artery, with possible occlusion, superior mesenteric artery with stenosis, and left renal artery with stenosis contributing to decreased left-sided kidney perfusion. -splenic vein occlusion, left renal vein occlusion, which contributes to left-sided varicocele -increasing retroperitoneal extension along the left psoas muscle, contributing to partial left ureteral occlusion and developing left hydronephrosis -increasing retroperitoneal nodularity/inflammation, particularly in the left perinephric space, left pericolic gutter, and the pelvis. -increasing extraperitoneal disease of the left rectus musculature surrounding the inferior epigastric vasculature -new metastasis of the right liver along the margin, with increasing size of tumor of segment 2/3 -small volume pelvic ascites, presumably malignant. Mottled appearance of the liver is likely perfusion related given the compromise of celiac artery/common hepatic artery. Left-sided pleural effusion. Left renal cell carcinoma Electronically Signed   By: Corrie Mckusick D.O.   On: 02/15/2018 14:06   Dg Abdomen Acute W/chest  Result Date: 03/12/2018 CLINICAL DATA:  Metastatic pancreatic cancer.  Vomiting and nausea. EXAM: DG ABDOMEN ACUTE W/ 1V CHEST COMPARISON:  03/21/2013 chest radiograph. FINDINGS: Stable cardiomediastinal silhouette with normal heart size. No pneumothorax. Small dependent left pleural effusion. No right pleural effusion. No pulmonary edema. No acute consolidative airspace disease. Mildly dilated central small bowel loop with air-fluid level.  Moderate colorectal stool volume. No evidence of pneumatosis or pneumoperitoneum. No radiopaque nephrolithiasis. IMPRESSION: 1. Small dependent left pleural effusion. 2. Mildly dilated central small bowel loop with air-fluid level, cannot exclude partial mid to distal small bowel obstruction. 3. Moderate colorectal stool volume, suggesting constipation. Electronically Signed   By: Ilona Sorrel M.D.   On: 03/12/2018 02:55   [place] Ekg Site Rite  Result Date: 03/14/2018 If Site Rite image not attached, placement could not be confirmed due to current cardiac rhythm.   Microbiology: No results found for this or any previous visit (from the past 240 hour(s)).   Labs: Basic Metabolic Panel: Recent Labs  Lab 03/12/18 0135 03/13/18 0343  NA 135 133*  K 4.1 4.0  CL 97* 99  CO2 28 26  GLUCOSE 124* 89  BUN 23 19  CREATININE 1.03 0.99  CALCIUM 9.5 8.8*   Liver Function Tests: Recent Labs  Lab 03/12/18 0135 03/13/18 0343  AST 17 15  ALT 11 10  ALKPHOS 107 103  BILITOT 0.8 0.8  PROT 6.6 5.7*  ALBUMIN 3.6 3.1*   Recent Labs  Lab 03/12/18 0135  LIPASE 22   No results for input(s): AMMONIA in the last 168 hours. CBC: Recent Labs  Lab 03/12/18 0135 03/13/18 0343  WBC 6.4 6.3  NEUTROABS 4.2  --   HGB 12.9* 13.3  HCT 37.7* 39.3  MCV 92.2 93.3  PLT 258 274   Cardiac Enzymes: Recent Labs  Lab 03/12/18 0135  TROPONINI <0.03   BNP: BNP (last 3 results) No results for input(s): BNP in the last 8760 hours.  ProBNP (last 3 results) No results for input(s): PROBNP in the last 8760 hours.  CBG: No results for input(s): GLUCAP in the last 168 hours.     Signed:  Nita Sells MD   [facility] 03/14/2018, 10:11 AM

## 2018-03-14 NOTE — Progress Notes (Signed)
[facility] Liaison RN note.  Notified by [facility] that patient is eligible for [facility] and can transfer today. [facility] has requested the patient have a PICC line placed prior to transfer. Dr. Verlon Au was notified of this request and stated he would order the PICC.   Hospital Liaison will follow up after the PICC is placed to coordinate the transfer.  Please call with any questions.  Thank you,   Farrel Gordon, RN, [facility] Liaison  [facility] are on [facility]

## 2018-03-19 DIAGNOSIS — R110 Nausea: Secondary | ICD-10-CM | POA: Diagnosis not present

## 2018-03-19 DIAGNOSIS — R197 Diarrhea, unspecified: Secondary | ICD-10-CM | POA: Diagnosis not present

## 2018-03-19 DIAGNOSIS — C787 Secondary malignant neoplasm of liver and intrahepatic bile duct: Secondary | ICD-10-CM | POA: Diagnosis not present

## 2018-03-19 DIAGNOSIS — E785 Hyperlipidemia, unspecified: Secondary | ICD-10-CM | POA: Diagnosis not present

## 2018-03-19 DIAGNOSIS — R1084 Generalized abdominal pain: Secondary | ICD-10-CM | POA: Diagnosis not present

## 2018-03-19 DIAGNOSIS — C259 Malignant neoplasm of pancreas, unspecified: Secondary | ICD-10-CM | POA: Diagnosis not present

## 2018-03-19 DIAGNOSIS — L719 Rosacea, unspecified: Secondary | ICD-10-CM | POA: Diagnosis not present

## 2018-03-19 DIAGNOSIS — C786 Secondary malignant neoplasm of retroperitoneum and peritoneum: Secondary | ICD-10-CM | POA: Diagnosis not present

## 2018-03-19 DIAGNOSIS — C7800 Secondary malignant neoplasm of unspecified lung: Secondary | ICD-10-CM | POA: Diagnosis not present

## 2018-03-20 DIAGNOSIS — R1084 Generalized abdominal pain: Secondary | ICD-10-CM | POA: Diagnosis not present

## 2018-03-20 DIAGNOSIS — R197 Diarrhea, unspecified: Secondary | ICD-10-CM | POA: Diagnosis not present

## 2018-03-20 DIAGNOSIS — C787 Secondary malignant neoplasm of liver and intrahepatic bile duct: Secondary | ICD-10-CM | POA: Diagnosis not present

## 2018-03-20 DIAGNOSIS — C786 Secondary malignant neoplasm of retroperitoneum and peritoneum: Secondary | ICD-10-CM | POA: Diagnosis not present

## 2018-03-20 DIAGNOSIS — C7800 Secondary malignant neoplasm of unspecified lung: Secondary | ICD-10-CM | POA: Diagnosis not present

## 2018-03-20 DIAGNOSIS — C259 Malignant neoplasm of pancreas, unspecified: Secondary | ICD-10-CM | POA: Diagnosis not present

## 2018-03-22 DIAGNOSIS — C7800 Secondary malignant neoplasm of unspecified lung: Secondary | ICD-10-CM | POA: Diagnosis not present

## 2018-03-22 DIAGNOSIS — C787 Secondary malignant neoplasm of liver and intrahepatic bile duct: Secondary | ICD-10-CM | POA: Diagnosis not present

## 2018-03-22 DIAGNOSIS — C786 Secondary malignant neoplasm of retroperitoneum and peritoneum: Secondary | ICD-10-CM | POA: Diagnosis not present

## 2018-03-22 DIAGNOSIS — R1084 Generalized abdominal pain: Secondary | ICD-10-CM | POA: Diagnosis not present

## 2018-03-22 DIAGNOSIS — R197 Diarrhea, unspecified: Secondary | ICD-10-CM | POA: Diagnosis not present

## 2018-03-22 DIAGNOSIS — C259 Malignant neoplasm of pancreas, unspecified: Secondary | ICD-10-CM | POA: Diagnosis not present

## 2018-03-24 DIAGNOSIS — C786 Secondary malignant neoplasm of retroperitoneum and peritoneum: Secondary | ICD-10-CM | POA: Diagnosis not present

## 2018-03-24 DIAGNOSIS — R197 Diarrhea, unspecified: Secondary | ICD-10-CM | POA: Diagnosis not present

## 2018-03-24 DIAGNOSIS — C787 Secondary malignant neoplasm of liver and intrahepatic bile duct: Secondary | ICD-10-CM | POA: Diagnosis not present

## 2018-03-24 DIAGNOSIS — C259 Malignant neoplasm of pancreas, unspecified: Secondary | ICD-10-CM | POA: Diagnosis not present

## 2018-03-24 DIAGNOSIS — C7800 Secondary malignant neoplasm of unspecified lung: Secondary | ICD-10-CM | POA: Diagnosis not present

## 2018-03-24 DIAGNOSIS — R1084 Generalized abdominal pain: Secondary | ICD-10-CM | POA: Diagnosis not present

## 2018-03-25 DIAGNOSIS — C7800 Secondary malignant neoplasm of unspecified lung: Secondary | ICD-10-CM | POA: Diagnosis not present

## 2018-03-25 DIAGNOSIS — R1084 Generalized abdominal pain: Secondary | ICD-10-CM | POA: Diagnosis not present

## 2018-03-25 DIAGNOSIS — C786 Secondary malignant neoplasm of retroperitoneum and peritoneum: Secondary | ICD-10-CM | POA: Diagnosis not present

## 2018-03-25 DIAGNOSIS — C259 Malignant neoplasm of pancreas, unspecified: Secondary | ICD-10-CM | POA: Diagnosis not present

## 2018-03-25 DIAGNOSIS — C787 Secondary malignant neoplasm of liver and intrahepatic bile duct: Secondary | ICD-10-CM | POA: Diagnosis not present

## 2018-03-25 DIAGNOSIS — R197 Diarrhea, unspecified: Secondary | ICD-10-CM | POA: Diagnosis not present

## 2018-03-26 DIAGNOSIS — C786 Secondary malignant neoplasm of retroperitoneum and peritoneum: Secondary | ICD-10-CM | POA: Diagnosis not present

## 2018-03-26 DIAGNOSIS — C259 Malignant neoplasm of pancreas, unspecified: Secondary | ICD-10-CM | POA: Diagnosis not present

## 2018-03-26 DIAGNOSIS — C7800 Secondary malignant neoplasm of unspecified lung: Secondary | ICD-10-CM | POA: Diagnosis not present

## 2018-03-26 DIAGNOSIS — R1084 Generalized abdominal pain: Secondary | ICD-10-CM | POA: Diagnosis not present

## 2018-03-26 DIAGNOSIS — C787 Secondary malignant neoplasm of liver and intrahepatic bile duct: Secondary | ICD-10-CM | POA: Diagnosis not present

## 2018-03-26 DIAGNOSIS — R197 Diarrhea, unspecified: Secondary | ICD-10-CM | POA: Diagnosis not present

## 2018-03-27 DIAGNOSIS — C7800 Secondary malignant neoplasm of unspecified lung: Secondary | ICD-10-CM | POA: Diagnosis not present

## 2018-03-27 DIAGNOSIS — C787 Secondary malignant neoplasm of liver and intrahepatic bile duct: Secondary | ICD-10-CM | POA: Diagnosis not present

## 2018-03-27 DIAGNOSIS — R1084 Generalized abdominal pain: Secondary | ICD-10-CM | POA: Diagnosis not present

## 2018-03-27 DIAGNOSIS — C786 Secondary malignant neoplasm of retroperitoneum and peritoneum: Secondary | ICD-10-CM | POA: Diagnosis not present

## 2018-03-27 DIAGNOSIS — R197 Diarrhea, unspecified: Secondary | ICD-10-CM | POA: Diagnosis not present

## 2018-03-27 DIAGNOSIS — C259 Malignant neoplasm of pancreas, unspecified: Secondary | ICD-10-CM | POA: Diagnosis not present

## 2018-03-28 DIAGNOSIS — R197 Diarrhea, unspecified: Secondary | ICD-10-CM | POA: Diagnosis not present

## 2018-03-28 DIAGNOSIS — C7800 Secondary malignant neoplasm of unspecified lung: Secondary | ICD-10-CM | POA: Diagnosis not present

## 2018-03-28 DIAGNOSIS — R1084 Generalized abdominal pain: Secondary | ICD-10-CM | POA: Diagnosis not present

## 2018-03-28 DIAGNOSIS — C787 Secondary malignant neoplasm of liver and intrahepatic bile duct: Secondary | ICD-10-CM | POA: Diagnosis not present

## 2018-03-28 DIAGNOSIS — C259 Malignant neoplasm of pancreas, unspecified: Secondary | ICD-10-CM | POA: Diagnosis not present

## 2018-03-28 DIAGNOSIS — C786 Secondary malignant neoplasm of retroperitoneum and peritoneum: Secondary | ICD-10-CM | POA: Diagnosis not present

## 2018-03-29 DIAGNOSIS — C787 Secondary malignant neoplasm of liver and intrahepatic bile duct: Secondary | ICD-10-CM | POA: Diagnosis not present

## 2018-03-29 DIAGNOSIS — R197 Diarrhea, unspecified: Secondary | ICD-10-CM | POA: Diagnosis not present

## 2018-03-29 DIAGNOSIS — C7800 Secondary malignant neoplasm of unspecified lung: Secondary | ICD-10-CM | POA: Diagnosis not present

## 2018-03-29 DIAGNOSIS — R1084 Generalized abdominal pain: Secondary | ICD-10-CM | POA: Diagnosis not present

## 2018-03-29 DIAGNOSIS — C259 Malignant neoplasm of pancreas, unspecified: Secondary | ICD-10-CM | POA: Diagnosis not present

## 2018-03-29 DIAGNOSIS — C786 Secondary malignant neoplasm of retroperitoneum and peritoneum: Secondary | ICD-10-CM | POA: Diagnosis not present

## 2018-04-01 DIAGNOSIS — R1084 Generalized abdominal pain: Secondary | ICD-10-CM | POA: Diagnosis not present

## 2018-04-01 DIAGNOSIS — C259 Malignant neoplasm of pancreas, unspecified: Secondary | ICD-10-CM | POA: Diagnosis not present

## 2018-04-01 DIAGNOSIS — C787 Secondary malignant neoplasm of liver and intrahepatic bile duct: Secondary | ICD-10-CM | POA: Diagnosis not present

## 2018-04-01 DIAGNOSIS — R197 Diarrhea, unspecified: Secondary | ICD-10-CM | POA: Diagnosis not present

## 2018-04-01 DIAGNOSIS — C7800 Secondary malignant neoplasm of unspecified lung: Secondary | ICD-10-CM | POA: Diagnosis not present

## 2018-04-01 DIAGNOSIS — C786 Secondary malignant neoplasm of retroperitoneum and peritoneum: Secondary | ICD-10-CM | POA: Diagnosis not present

## 2018-04-03 DIAGNOSIS — C787 Secondary malignant neoplasm of liver and intrahepatic bile duct: Secondary | ICD-10-CM | POA: Diagnosis not present

## 2018-04-03 DIAGNOSIS — C259 Malignant neoplasm of pancreas, unspecified: Secondary | ICD-10-CM | POA: Diagnosis not present

## 2018-04-03 DIAGNOSIS — R1084 Generalized abdominal pain: Secondary | ICD-10-CM | POA: Diagnosis not present

## 2018-04-03 DIAGNOSIS — C7800 Secondary malignant neoplasm of unspecified lung: Secondary | ICD-10-CM | POA: Diagnosis not present

## 2018-04-03 DIAGNOSIS — R197 Diarrhea, unspecified: Secondary | ICD-10-CM | POA: Diagnosis not present

## 2018-04-03 DIAGNOSIS — C786 Secondary malignant neoplasm of retroperitoneum and peritoneum: Secondary | ICD-10-CM | POA: Diagnosis not present

## 2018-04-05 DIAGNOSIS — C787 Secondary malignant neoplasm of liver and intrahepatic bile duct: Secondary | ICD-10-CM | POA: Diagnosis not present

## 2018-04-05 DIAGNOSIS — R1084 Generalized abdominal pain: Secondary | ICD-10-CM | POA: Diagnosis not present

## 2018-04-05 DIAGNOSIS — R197 Diarrhea, unspecified: Secondary | ICD-10-CM | POA: Diagnosis not present

## 2018-04-05 DIAGNOSIS — C259 Malignant neoplasm of pancreas, unspecified: Secondary | ICD-10-CM | POA: Diagnosis not present

## 2018-04-05 DIAGNOSIS — C7800 Secondary malignant neoplasm of unspecified lung: Secondary | ICD-10-CM | POA: Diagnosis not present

## 2018-04-05 DIAGNOSIS — C786 Secondary malignant neoplasm of retroperitoneum and peritoneum: Secondary | ICD-10-CM | POA: Diagnosis not present

## 2018-04-08 DIAGNOSIS — R1084 Generalized abdominal pain: Secondary | ICD-10-CM | POA: Diagnosis not present

## 2018-04-08 DIAGNOSIS — C259 Malignant neoplasm of pancreas, unspecified: Secondary | ICD-10-CM | POA: Diagnosis not present

## 2018-04-08 DIAGNOSIS — R197 Diarrhea, unspecified: Secondary | ICD-10-CM | POA: Diagnosis not present

## 2018-04-08 DIAGNOSIS — C787 Secondary malignant neoplasm of liver and intrahepatic bile duct: Secondary | ICD-10-CM | POA: Diagnosis not present

## 2018-04-08 DIAGNOSIS — C786 Secondary malignant neoplasm of retroperitoneum and peritoneum: Secondary | ICD-10-CM | POA: Diagnosis not present

## 2018-04-08 DIAGNOSIS — C7800 Secondary malignant neoplasm of unspecified lung: Secondary | ICD-10-CM | POA: Diagnosis not present

## 2018-04-09 DIAGNOSIS — R197 Diarrhea, unspecified: Secondary | ICD-10-CM | POA: Diagnosis not present

## 2018-04-09 DIAGNOSIS — C787 Secondary malignant neoplasm of liver and intrahepatic bile duct: Secondary | ICD-10-CM | POA: Diagnosis not present

## 2018-04-09 DIAGNOSIS — R1084 Generalized abdominal pain: Secondary | ICD-10-CM | POA: Diagnosis not present

## 2018-04-09 DIAGNOSIS — C786 Secondary malignant neoplasm of retroperitoneum and peritoneum: Secondary | ICD-10-CM | POA: Diagnosis not present

## 2018-04-09 DIAGNOSIS — C7800 Secondary malignant neoplasm of unspecified lung: Secondary | ICD-10-CM | POA: Diagnosis not present

## 2018-04-09 DIAGNOSIS — C259 Malignant neoplasm of pancreas, unspecified: Secondary | ICD-10-CM | POA: Diagnosis not present

## 2018-04-09 IMAGING — CT CT ABD-PELV W/ CM
2 of 5 series · 15 of 46 positions shown, 17 images · IV contrast (APPLIED)
Comparison: PET 01/22/2017.   CT 12/08/2016.

CLINICAL DATA: Followup of pancreatic cancer. Appendectomy and
splenectomy. Partial pancreatectomy. Diarrhea for 4 months. Weight
loss.

EXAM:
CT ABDOMEN AND PELVIS WITH CONTRAST
TECHNIQUE: Multidetector CT imaging of the abdomen and pelvis was performed
using the standard protocol following bolus administration of
intravenous contrast.
CONTRAST:  100mL OT8FQO-JAA IOPAMIDOL (OT8FQO-JAA) INJECTION 61%

[Series 2: axial st · axial · 0.67mm/px · z∈[-464,-99]mm · 12 of 85 slices shown, 14 images]
[im 6/85  soft-tissue]
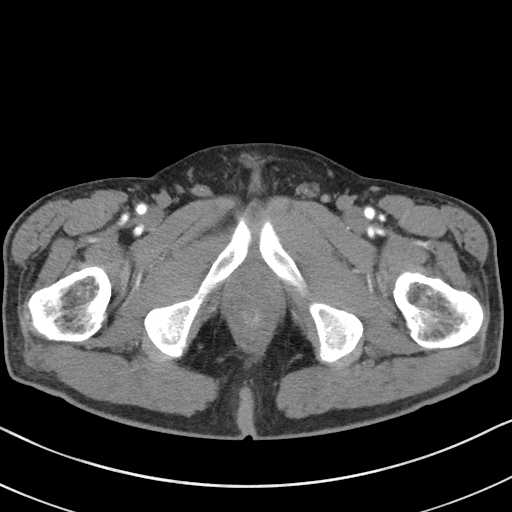
[im 6/85  bone]
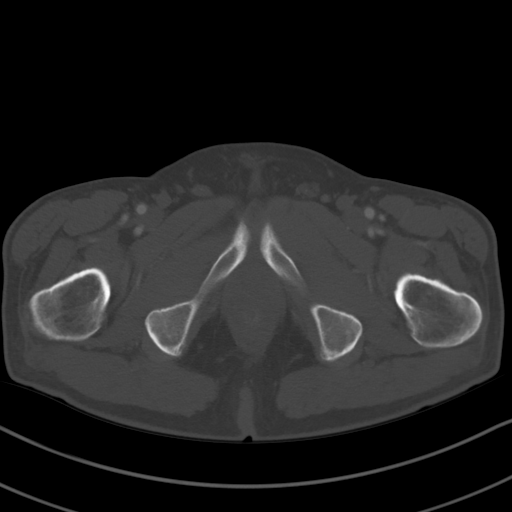
[im 12/85  soft-tissue]
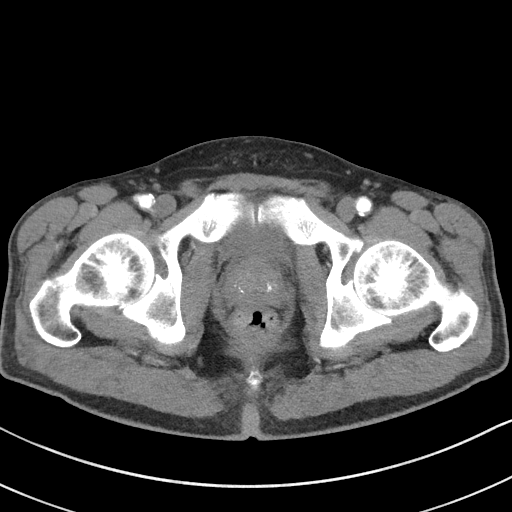
[im 17/85  soft-tissue]
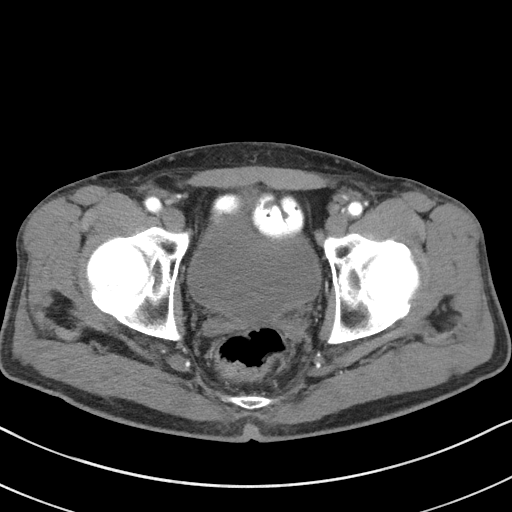
[im 29/85  soft-tissue]
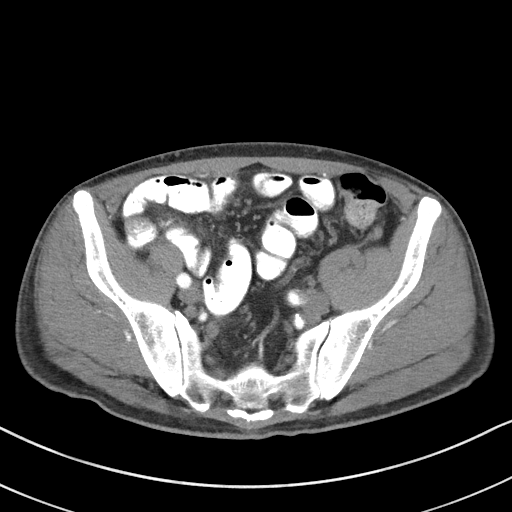
[im 34/85  soft-tissue]
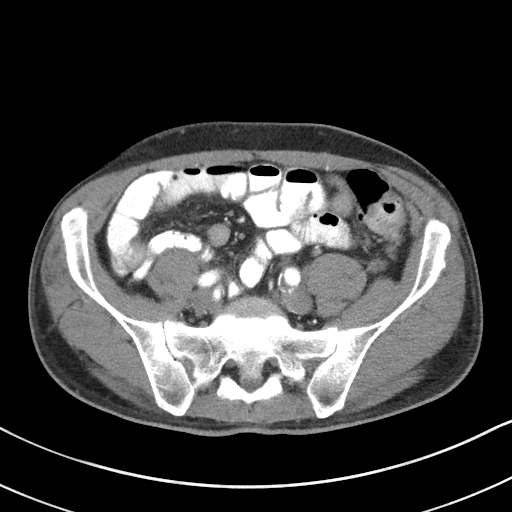
[im 40/85  soft-tissue]
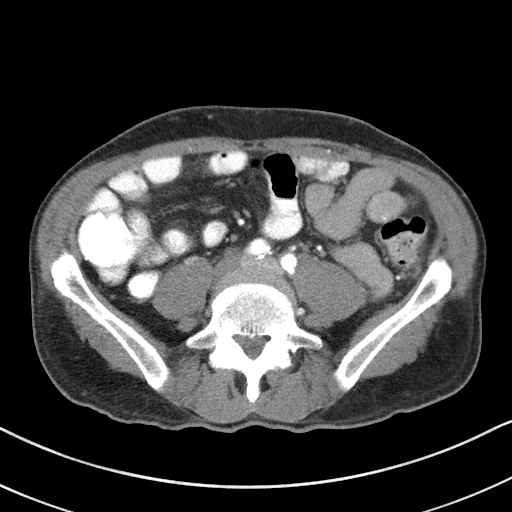
[im 45/85  soft-tissue]
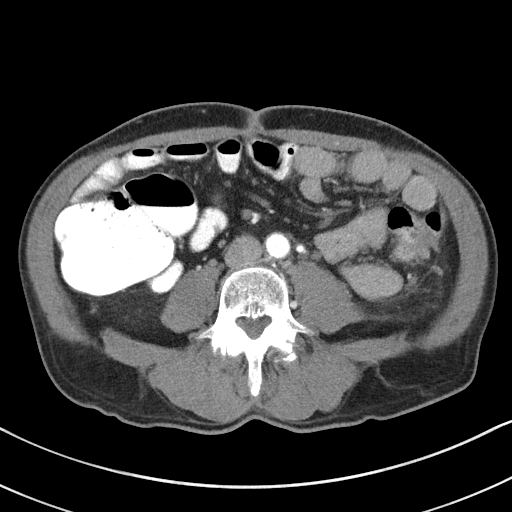
[im 51/85  soft-tissue]
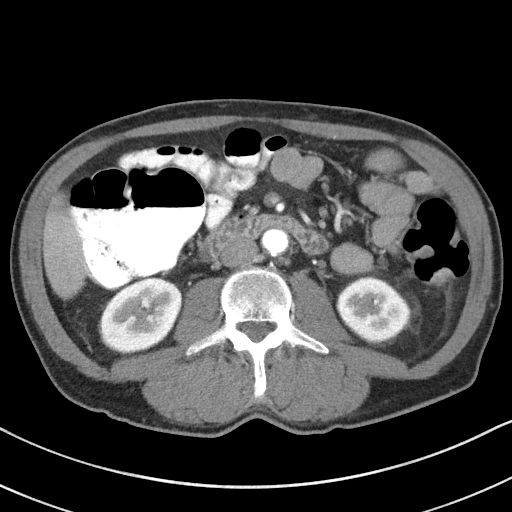
[im 57/85  soft-tissue]
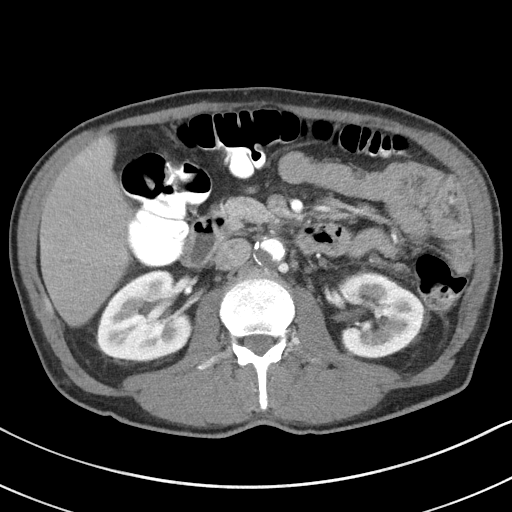
[im 57/85  bone]
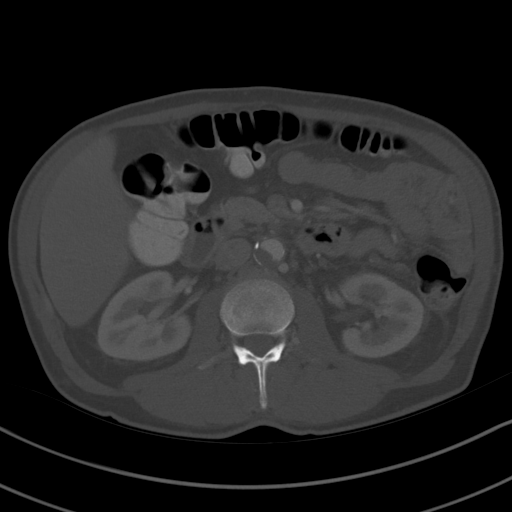
[im 68/85  soft-tissue]
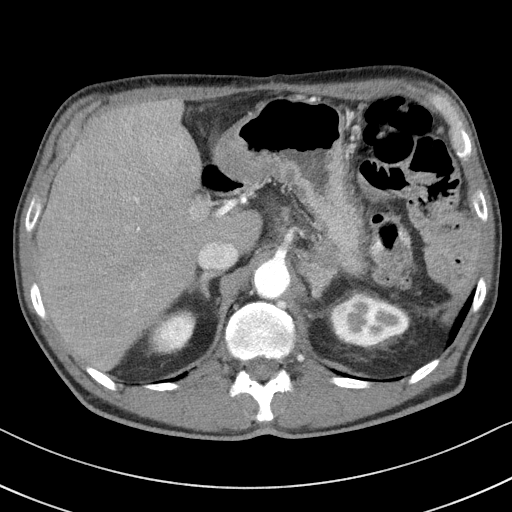
[im 73/85  soft-tissue]
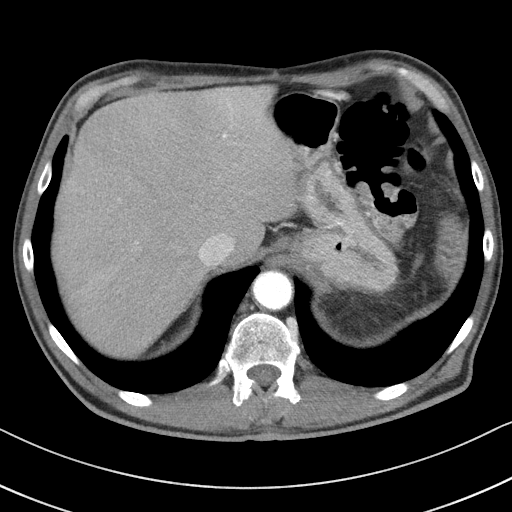
[im 79/85  soft-tissue]
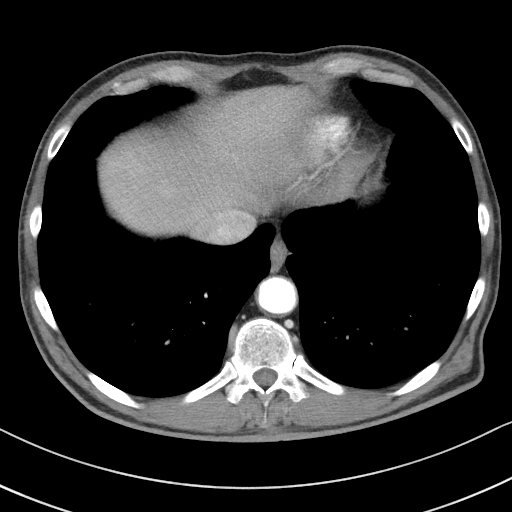

[Series 5: coronal st · coronal · 0.68mm/px · 3 of 85 slices shown]
[im 29/85  soft-tissue]
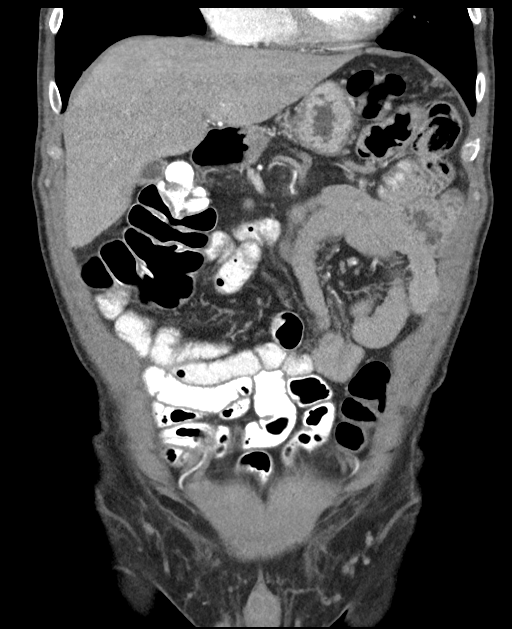
[im 38/85  soft-tissue]
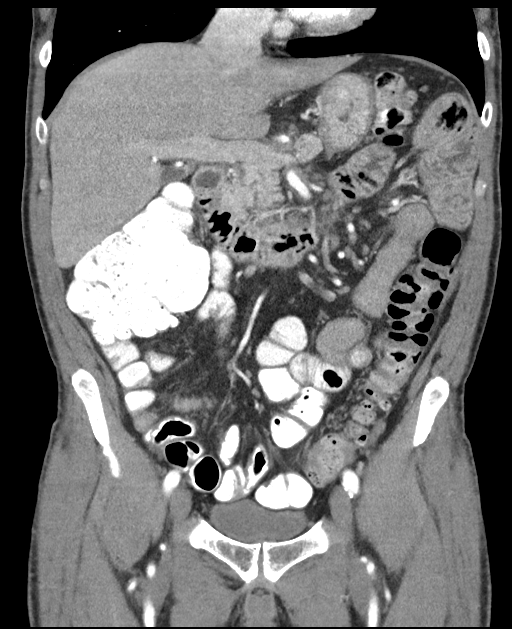
[im 47/85  soft-tissue]
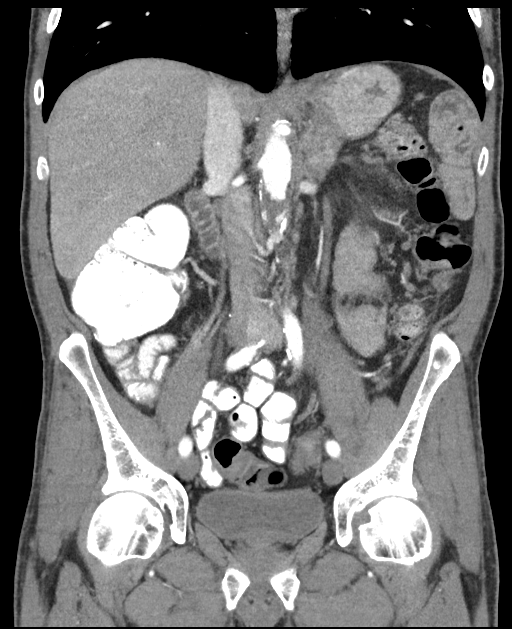

[15 of 46 positions shown; findings below may reference images not displayed]

FINDINGS: Lower chest: Clear lung bases. Normal heart size without pericardial
or pleural effusion.

Hepatobiliary: Possible heterogeneous hepatic steatosis, without
well-defined liver lesion. Normal gallbladder, without biliary
ductal dilatation.

Pancreas: Distal pancreatectomy. Infiltrative partially calcified
soft tissue mass centered to the left of the superior mesenteric
artery measures on the order of 3.6 x 3.3 cm on image 22/series 2.
Compare 3.8 x 2.2 cm on the prior exam. Soft tissue currently
obscures the anterior portion of the left adrenal gland, progressive
since the prior. There is also extension superiorly, with presumed
tumor just left of the celiac on image 19/series 2 (compare image
27/series 2 of the 12/08/2016 exam). Similarly, this measures 3.8 cm
craniocaudal on coronal image 46 today versus 2.4 cm on the prior
exam (when remeasured).

Spleen: Splenectomy.

Adrenals/Urinary Tract: Normal right adrenal gland. The left adrenal
is intimately associated with the infiltrative tumor as detailed
above.

An interpolar right renal too small to characterize lesion. Upper
pole 1.6 cm left renal lesion has enlarged from 1.3 cm when measured
similarly on the prior. Normal urinary bladder.

Stomach/Bowel: Gastric antral underdistention. Extensive colonic
diverticulosis. Normal terminal ileum. Normal small bowel.

Vascular/Lymphatic: Aortic and branch vessel atherosclerosis. Celiac
and superior mesenteric involvement by recurrent tumor. No separate
areas of abdominal adenopathy. No pelvic sidewall adenopathy.

Reproductive: Normal prostate.

Other: No significant free fluid.

Musculoskeletal: Grade 1 L4-5 anterolisthesis.
IMPRESSION: 1. Interval progression of infiltrative tumor recurrence within the
retroperitoneum, in this patient who is status post partial
pancreatectomy for adenocarcinoma.
2. No new sites of disease identified. No evidence of hepatic
metastasis.
3. interval enlargement of a left renal lesion which demonstrates
apparent enhancement and is suspicious for a synchronous renal cell
carcinoma. Given comorbidities, of doubtful clinical significance.
4.  Aortic Atherosclerosis (NSUK2-POS.S).

## 2018-04-10 DIAGNOSIS — C787 Secondary malignant neoplasm of liver and intrahepatic bile duct: Secondary | ICD-10-CM | POA: Diagnosis not present

## 2018-04-10 DIAGNOSIS — C786 Secondary malignant neoplasm of retroperitoneum and peritoneum: Secondary | ICD-10-CM | POA: Diagnosis not present

## 2018-04-10 DIAGNOSIS — R1084 Generalized abdominal pain: Secondary | ICD-10-CM | POA: Diagnosis not present

## 2018-04-10 DIAGNOSIS — C7800 Secondary malignant neoplasm of unspecified lung: Secondary | ICD-10-CM | POA: Diagnosis not present

## 2018-04-10 DIAGNOSIS — R197 Diarrhea, unspecified: Secondary | ICD-10-CM | POA: Diagnosis not present

## 2018-04-10 DIAGNOSIS — C259 Malignant neoplasm of pancreas, unspecified: Secondary | ICD-10-CM | POA: Diagnosis not present

## 2018-04-13 DIAGNOSIS — R197 Diarrhea, unspecified: Secondary | ICD-10-CM | POA: Diagnosis not present

## 2018-04-13 DIAGNOSIS — C786 Secondary malignant neoplasm of retroperitoneum and peritoneum: Secondary | ICD-10-CM | POA: Diagnosis not present

## 2018-04-13 DIAGNOSIS — R1084 Generalized abdominal pain: Secondary | ICD-10-CM | POA: Diagnosis not present

## 2018-04-13 DIAGNOSIS — C259 Malignant neoplasm of pancreas, unspecified: Secondary | ICD-10-CM | POA: Diagnosis not present

## 2018-04-13 DIAGNOSIS — C787 Secondary malignant neoplasm of liver and intrahepatic bile duct: Secondary | ICD-10-CM | POA: Diagnosis not present

## 2018-04-13 DIAGNOSIS — C7800 Secondary malignant neoplasm of unspecified lung: Secondary | ICD-10-CM | POA: Diagnosis not present

## 2018-04-15 DIAGNOSIS — C786 Secondary malignant neoplasm of retroperitoneum and peritoneum: Secondary | ICD-10-CM | POA: Diagnosis not present

## 2018-04-15 DIAGNOSIS — R1084 Generalized abdominal pain: Secondary | ICD-10-CM | POA: Diagnosis not present

## 2018-04-15 DIAGNOSIS — C787 Secondary malignant neoplasm of liver and intrahepatic bile duct: Secondary | ICD-10-CM | POA: Diagnosis not present

## 2018-04-15 DIAGNOSIS — C259 Malignant neoplasm of pancreas, unspecified: Secondary | ICD-10-CM | POA: Diagnosis not present

## 2018-04-15 DIAGNOSIS — R197 Diarrhea, unspecified: Secondary | ICD-10-CM | POA: Diagnosis not present

## 2018-04-15 DIAGNOSIS — C7800 Secondary malignant neoplasm of unspecified lung: Secondary | ICD-10-CM | POA: Diagnosis not present

## 2018-04-16 DIAGNOSIS — C787 Secondary malignant neoplasm of liver and intrahepatic bile duct: Secondary | ICD-10-CM | POA: Diagnosis not present

## 2018-04-16 DIAGNOSIS — R1084 Generalized abdominal pain: Secondary | ICD-10-CM | POA: Diagnosis not present

## 2018-04-16 DIAGNOSIS — R197 Diarrhea, unspecified: Secondary | ICD-10-CM | POA: Diagnosis not present

## 2018-04-16 DIAGNOSIS — C259 Malignant neoplasm of pancreas, unspecified: Secondary | ICD-10-CM | POA: Diagnosis not present

## 2018-04-16 DIAGNOSIS — C7800 Secondary malignant neoplasm of unspecified lung: Secondary | ICD-10-CM | POA: Diagnosis not present

## 2018-04-16 DIAGNOSIS — C786 Secondary malignant neoplasm of retroperitoneum and peritoneum: Secondary | ICD-10-CM | POA: Diagnosis not present

## 2018-04-17 DIAGNOSIS — R197 Diarrhea, unspecified: Secondary | ICD-10-CM | POA: Diagnosis not present

## 2018-04-17 DIAGNOSIS — C7800 Secondary malignant neoplasm of unspecified lung: Secondary | ICD-10-CM | POA: Diagnosis not present

## 2018-04-17 DIAGNOSIS — C787 Secondary malignant neoplasm of liver and intrahepatic bile duct: Secondary | ICD-10-CM | POA: Diagnosis not present

## 2018-04-17 DIAGNOSIS — C786 Secondary malignant neoplasm of retroperitoneum and peritoneum: Secondary | ICD-10-CM | POA: Diagnosis not present

## 2018-04-17 DIAGNOSIS — R1084 Generalized abdominal pain: Secondary | ICD-10-CM | POA: Diagnosis not present

## 2018-04-17 DIAGNOSIS — C259 Malignant neoplasm of pancreas, unspecified: Secondary | ICD-10-CM | POA: Diagnosis not present

## 2018-04-18 DIAGNOSIS — R197 Diarrhea, unspecified: Secondary | ICD-10-CM | POA: Diagnosis not present

## 2018-04-18 DIAGNOSIS — C786 Secondary malignant neoplasm of retroperitoneum and peritoneum: Secondary | ICD-10-CM | POA: Diagnosis not present

## 2018-04-18 DIAGNOSIS — R1084 Generalized abdominal pain: Secondary | ICD-10-CM | POA: Diagnosis not present

## 2018-04-18 DIAGNOSIS — C259 Malignant neoplasm of pancreas, unspecified: Secondary | ICD-10-CM | POA: Diagnosis not present

## 2018-04-18 DIAGNOSIS — C787 Secondary malignant neoplasm of liver and intrahepatic bile duct: Secondary | ICD-10-CM | POA: Diagnosis not present

## 2018-04-18 DIAGNOSIS — C7800 Secondary malignant neoplasm of unspecified lung: Secondary | ICD-10-CM | POA: Diagnosis not present

## 2018-04-19 DIAGNOSIS — C7800 Secondary malignant neoplasm of unspecified lung: Secondary | ICD-10-CM | POA: Diagnosis not present

## 2018-04-19 DIAGNOSIS — C259 Malignant neoplasm of pancreas, unspecified: Secondary | ICD-10-CM | POA: Diagnosis not present

## 2018-04-19 DIAGNOSIS — R1084 Generalized abdominal pain: Secondary | ICD-10-CM | POA: Diagnosis not present

## 2018-04-19 DIAGNOSIS — C787 Secondary malignant neoplasm of liver and intrahepatic bile duct: Secondary | ICD-10-CM | POA: Diagnosis not present

## 2018-04-19 DIAGNOSIS — R197 Diarrhea, unspecified: Secondary | ICD-10-CM | POA: Diagnosis not present

## 2018-04-19 DIAGNOSIS — L719 Rosacea, unspecified: Secondary | ICD-10-CM | POA: Diagnosis not present

## 2018-04-19 DIAGNOSIS — E785 Hyperlipidemia, unspecified: Secondary | ICD-10-CM | POA: Diagnosis not present

## 2018-04-19 DIAGNOSIS — C786 Secondary malignant neoplasm of retroperitoneum and peritoneum: Secondary | ICD-10-CM | POA: Diagnosis not present

## 2018-04-19 DIAGNOSIS — R110 Nausea: Secondary | ICD-10-CM | POA: Diagnosis not present

## 2018-04-20 DIAGNOSIS — C787 Secondary malignant neoplasm of liver and intrahepatic bile duct: Secondary | ICD-10-CM | POA: Diagnosis not present

## 2018-04-20 DIAGNOSIS — C259 Malignant neoplasm of pancreas, unspecified: Secondary | ICD-10-CM | POA: Diagnosis not present

## 2018-04-20 DIAGNOSIS — C7800 Secondary malignant neoplasm of unspecified lung: Secondary | ICD-10-CM | POA: Diagnosis not present

## 2018-04-20 DIAGNOSIS — R1084 Generalized abdominal pain: Secondary | ICD-10-CM | POA: Diagnosis not present

## 2018-04-20 DIAGNOSIS — R197 Diarrhea, unspecified: Secondary | ICD-10-CM | POA: Diagnosis not present

## 2018-04-20 DIAGNOSIS — C786 Secondary malignant neoplasm of retroperitoneum and peritoneum: Secondary | ICD-10-CM | POA: Diagnosis not present

## 2018-04-21 DIAGNOSIS — C7800 Secondary malignant neoplasm of unspecified lung: Secondary | ICD-10-CM | POA: Diagnosis not present

## 2018-04-21 DIAGNOSIS — R1084 Generalized abdominal pain: Secondary | ICD-10-CM | POA: Diagnosis not present

## 2018-04-21 DIAGNOSIS — C787 Secondary malignant neoplasm of liver and intrahepatic bile duct: Secondary | ICD-10-CM | POA: Diagnosis not present

## 2018-04-21 DIAGNOSIS — R197 Diarrhea, unspecified: Secondary | ICD-10-CM | POA: Diagnosis not present

## 2018-04-21 DIAGNOSIS — C786 Secondary malignant neoplasm of retroperitoneum and peritoneum: Secondary | ICD-10-CM | POA: Diagnosis not present

## 2018-04-21 DIAGNOSIS — C259 Malignant neoplasm of pancreas, unspecified: Secondary | ICD-10-CM | POA: Diagnosis not present

## 2018-04-22 DIAGNOSIS — R197 Diarrhea, unspecified: Secondary | ICD-10-CM | POA: Diagnosis not present

## 2018-04-22 DIAGNOSIS — C787 Secondary malignant neoplasm of liver and intrahepatic bile duct: Secondary | ICD-10-CM | POA: Diagnosis not present

## 2018-04-22 DIAGNOSIS — C786 Secondary malignant neoplasm of retroperitoneum and peritoneum: Secondary | ICD-10-CM | POA: Diagnosis not present

## 2018-04-22 DIAGNOSIS — R1084 Generalized abdominal pain: Secondary | ICD-10-CM | POA: Diagnosis not present

## 2018-04-22 DIAGNOSIS — C259 Malignant neoplasm of pancreas, unspecified: Secondary | ICD-10-CM | POA: Diagnosis not present

## 2018-04-22 DIAGNOSIS — C7800 Secondary malignant neoplasm of unspecified lung: Secondary | ICD-10-CM | POA: Diagnosis not present

## 2018-04-23 ENCOUNTER — Telehealth: Payer: Self-pay | Admitting: *Deleted

## 2018-04-23 NOTE — Telephone Encounter (Signed)
Called to report patient died in home today at 3:45 pm. MD notified

## 2018-05-19 DEATH — deceased

## 2018-07-31 IMAGING — CT CT ABD-PELV W/ CM
2 of 8 series · 14 of 46 positions shown, 16 images · IV contrast (omnipaque)
Comparison: 06/18/2017

CLINICAL DATA: History of pancreas cancer.  Restaging.

EXAM:
CT ABDOMEN AND PELVIS WITH CONTRAST
TECHNIQUE: Multidetector CT imaging of the abdomen and pelvis was performed
using the standard protocol following bolus administration of
intravenous contrast.
CONTRAST:  100mL OMNIPAQUE IOHEXOL 300 MG/ML  SOLN

[Series 5: coronal arterial · coronal · arterial · 0.41mm/px · 3 of 84 slices shown]
[im 21/84  soft-tissue]
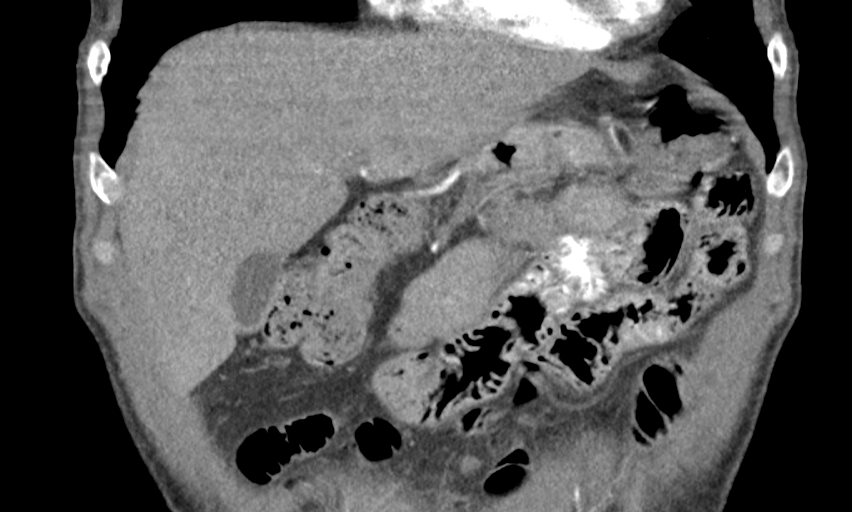
[im 42/84  soft-tissue]
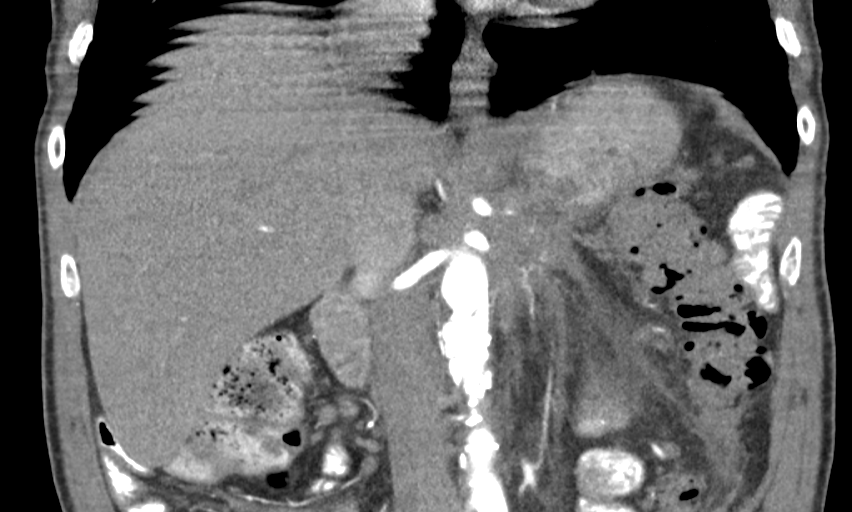
[im 63/84  soft-tissue]
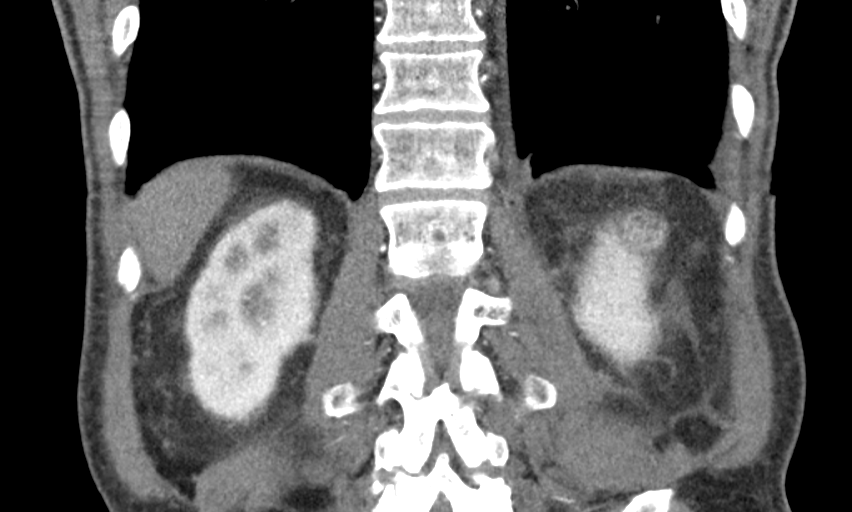

[Series 7: axial venous · axial · portal-venous · 0.71mm/px · z∈[-476,-113]mm · 11 of 141 slices shown, 13 images]
[im 10/141  soft-tissue]
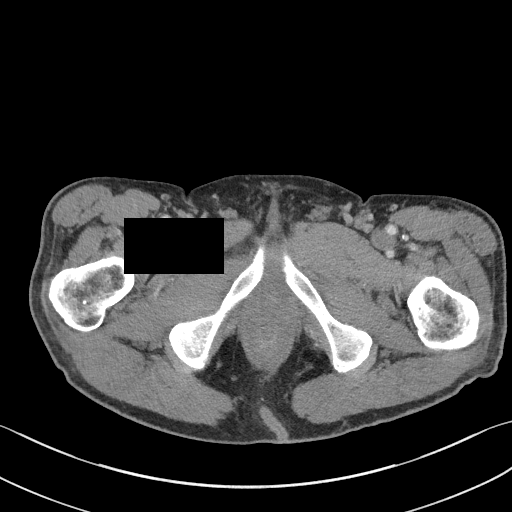
[im 10/141  bone]
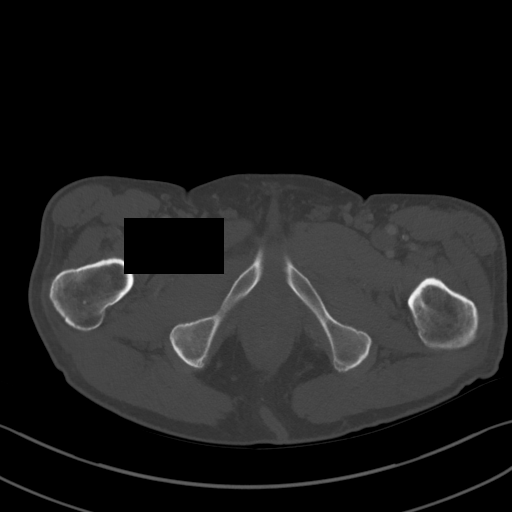
[im 19/141  soft-tissue]
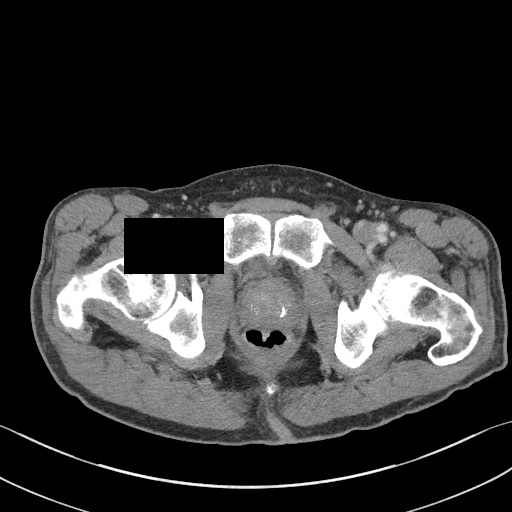
[im 38/141  soft-tissue]
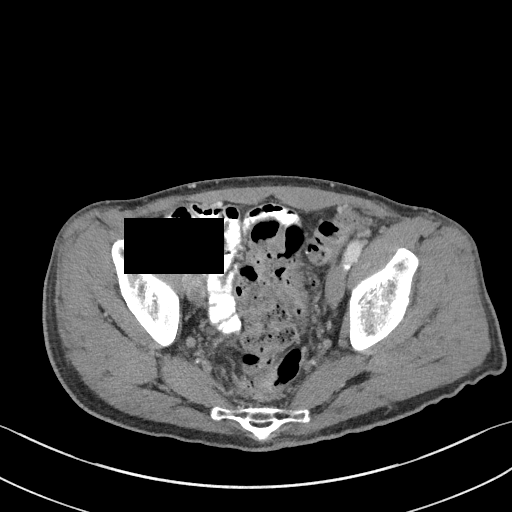
[im 47/141  soft-tissue]
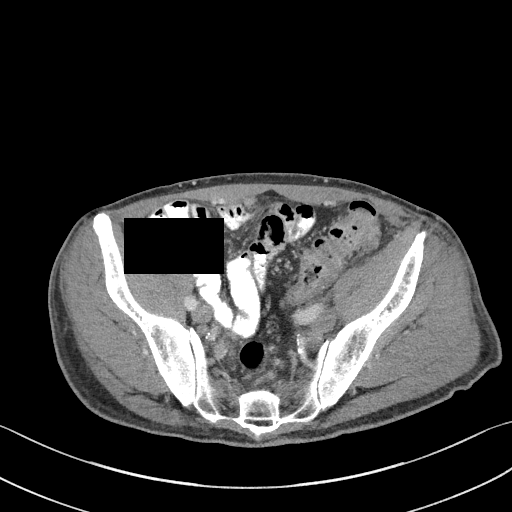
[im 57/141  soft-tissue]
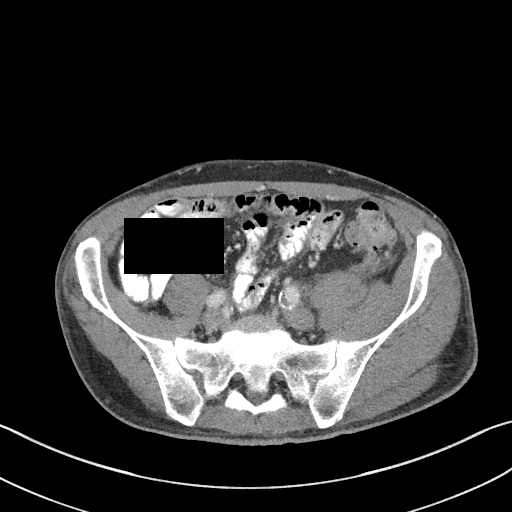
[im 75/141  soft-tissue]
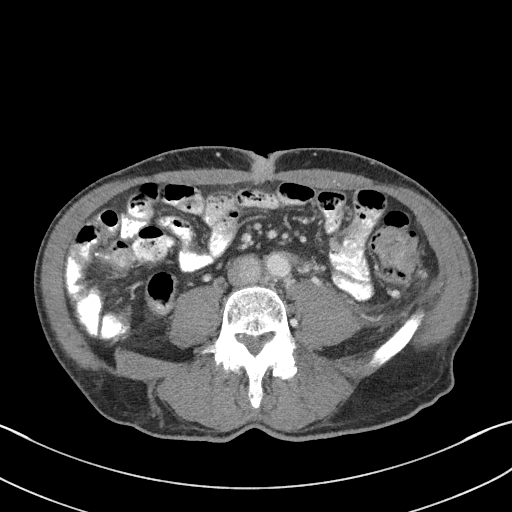
[im 85/141  soft-tissue]
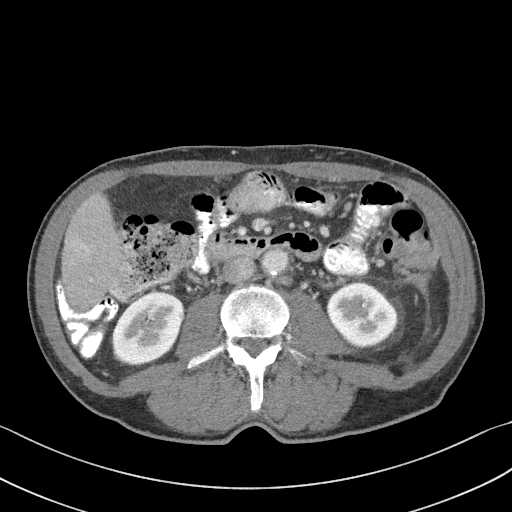
[im 94/141  soft-tissue]
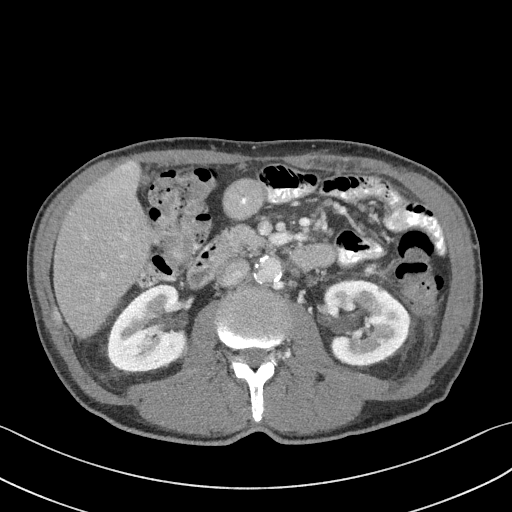
[im 103/141  soft-tissue]
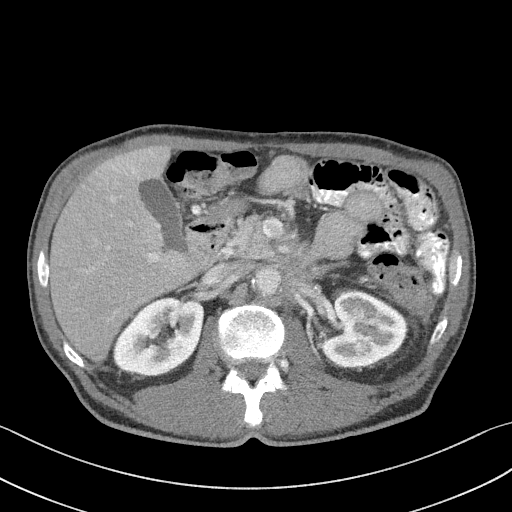
[im 103/141  bone]
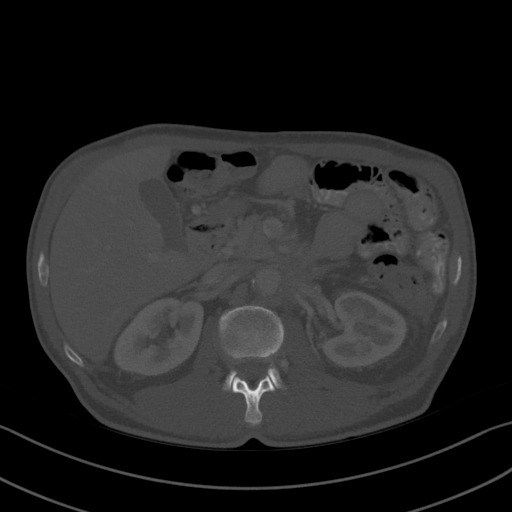
[im 122/141  soft-tissue]
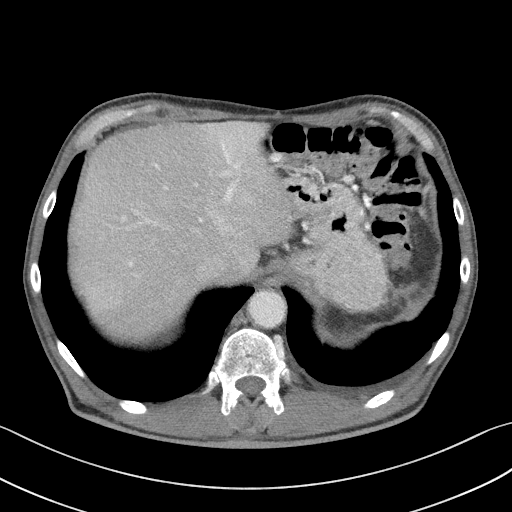
[im 131/141  soft-tissue]
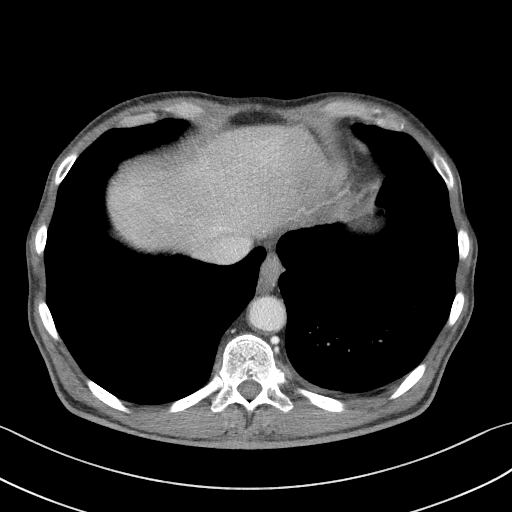

[14 of 46 positions shown; findings below may reference images not displayed]

FINDINGS: Lower chest: There is a small left pleural effusion which is new
from previous exam.

Hepatobiliary: Within segment 3 there is a new hypodense lesion
measuring 1.7 cm, image [DATE]. No additional focal liver
abnormalities. The gallbladder appears normal. No biliary
dilatation.

Pancreas: Status post distal pancreatectomy. Left retroperitoneal
mass which partially encases the left adrenal gland and superior
mesenteric artery and celiac artery measures 3.5 x 4.3 by 5.0 cm
(volume = 39 cm^3), image 35/7 and image 42/11. Previously, this
measured 3.6 by 3.3 by 3.8 cm (volume = 24 cm^3). Progressive
narrowing at the portal venous confluence is noted compared with
previous exam, image 37/11.

Spleen: Status post splenectomy.

Adrenals/Urinary Tract: Similar appearance of asymmetrically
enlarged and enhancing left adrenal gland, image [DATE]. Right adrenal
gland appears normal. Right kidney cyst measures 1 cm, image 51/7.
Enhancing lesion arising from the posterior cortex of the upper pole
of the left kidney measures 1.7 cm and is unchanged from previous
exam, image 32/7. No hydronephrosis identified. Urinary bladder
appears normal.

Stomach/Bowel: Stomach, small bowel loops and colon are
nondistended.

Vascular/Lymphatic: Aortic atherosclerosis. No aneurysm. Extensive
tumor infiltration within the upper portion of the retroperitoneum
with encasement of the celiac, SMA and aorta. No pelvic or inguinal
adenopathy identified.

Reproductive: Prostate is unremarkable.

Other: There is mild peritoneal nodularity concerning for
carcinomatosis. This is more conspicuous than on the previous exam.
Omental nodule within the left abdomen measures 7 mm, image 52/7.
Previously 5 mm. New nodule adjacent to the upper pole of the left
kidney measures 1.2 cm, image [DATE]. Within the right posterior
pelvis there is a soft tissue nodule measuring 8 mm, image 98/7. New
from previous exam.

Musculoskeletal: No aggressive lytic or sclerotic bone lesions.
IMPRESSION: 1. Interval progression of disease. Infiltrative tumor within the
retroperitoneum has increased in size compared with previous exam.
2. Mild peritoneal carcinomatosis is more conspicuous on today's
study. There is a new nodule identified within the right posterior
pelvis.
3. New hypodense lesion within segment 3 is concerning for liver
metastasis.
4. Stable left kidney enhancing lesion compatible with renal cell
carcinoma.
5.  Aortic Atherosclerosis ([id number].R).

## 2018-12-07 IMAGING — CT CT ABD-PELV W/ CM
1 of 3 series · 11 of 32 positions shown, 16 images · IV contrast (APPLIED)
Comparison: 10/09/2017, 06/18/2017, PET-CT 01/22/2017, 12/08/2016

CLINICAL DATA: 67-year-old male with a history of upper abdominal
pain

EXAM:
CT ABDOMEN AND PELVIS WITH CONTRAST
TECHNIQUE: Multidetector CT imaging of the abdomen and pelvis was performed
using the standard protocol following bolus administration of
intravenous contrast.
CONTRAST:  100mL L4QGB1-CNN IOPAMIDOL (L4QGB1-CNN) INJECTION 61%

[Series 2: abd/pelvis w/cm · axial · 0.71mm/px · z∈[-452,-42]mm · 11 of 94 slices shown, 16 images]
[im 6/94  soft-tissue]
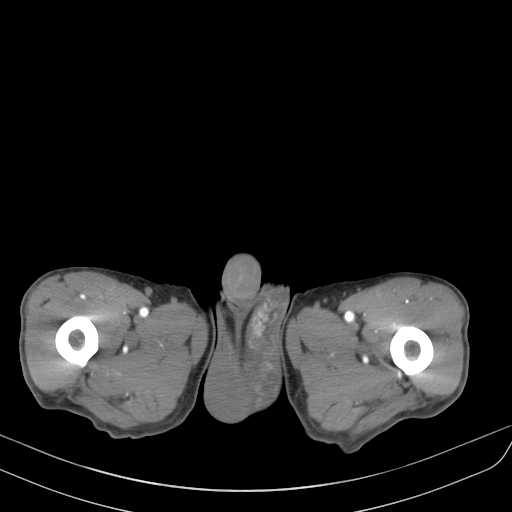
[im 6/94  bone]
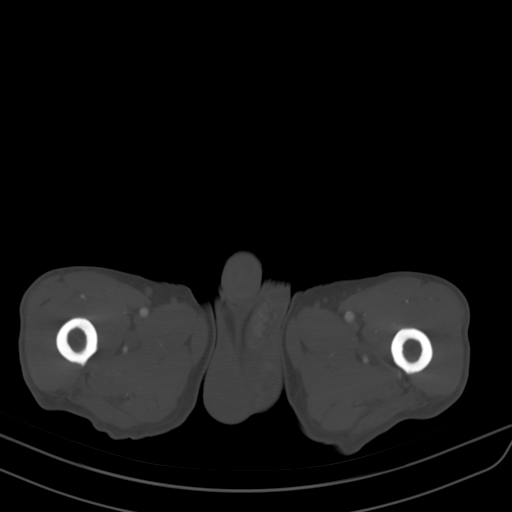
[im 18/94  soft-tissue]
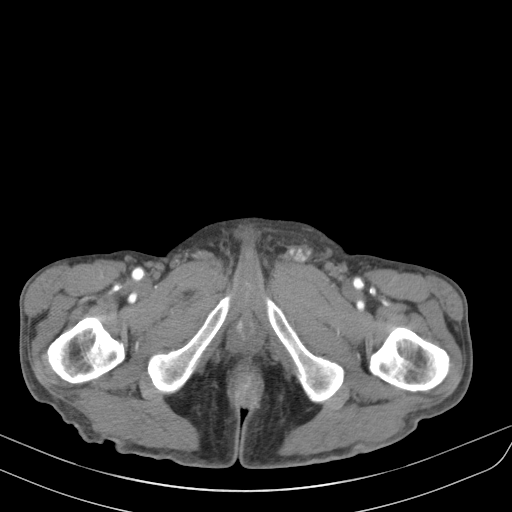
[im 24/94  soft-tissue]
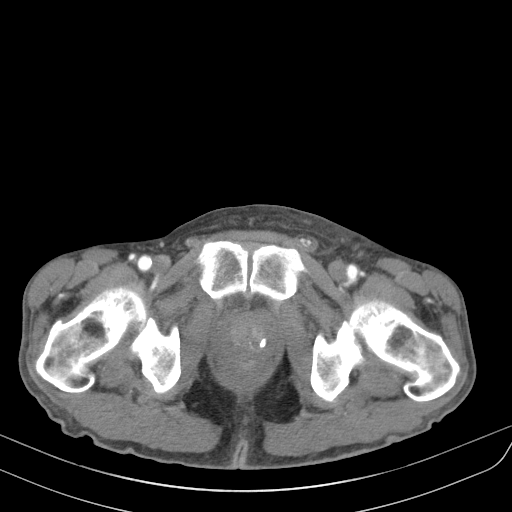
[im 35/94  soft-tissue]
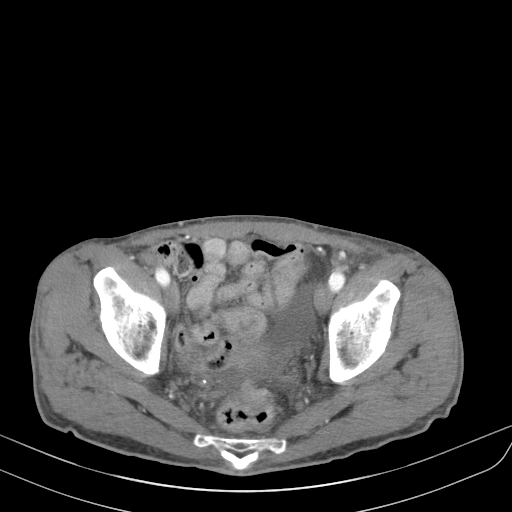
[im 41/94  soft-tissue]
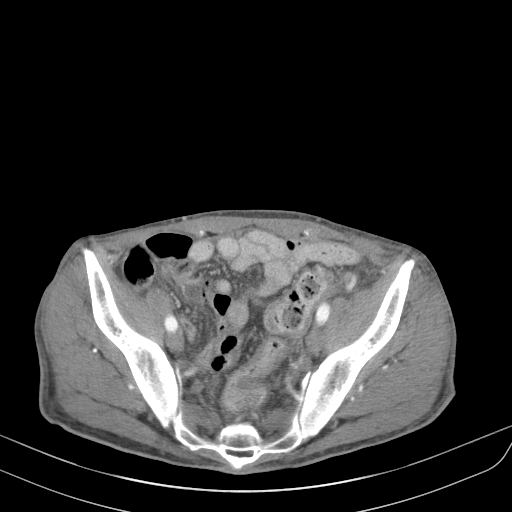
[im 53/94  soft-tissue]
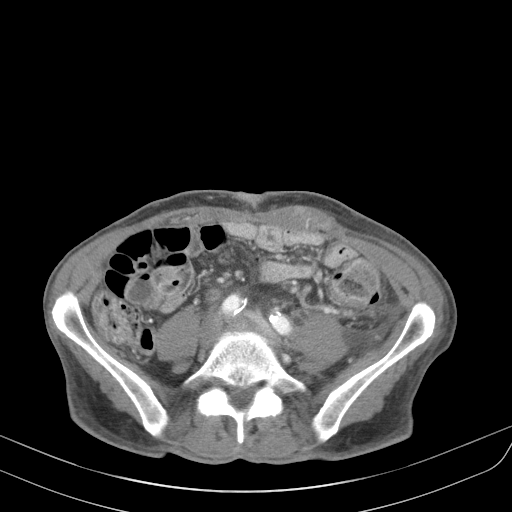
[im 59/94  soft-tissue]
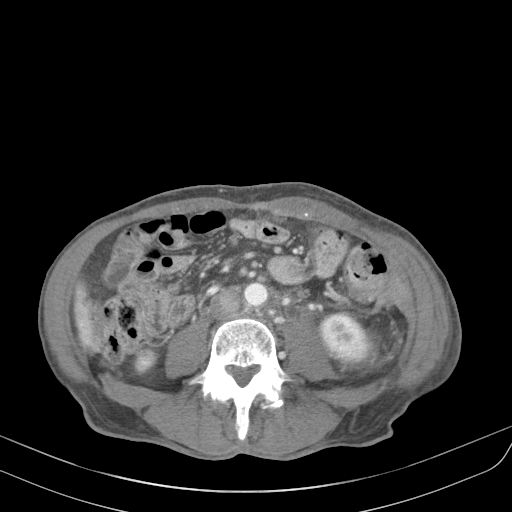
[im 70/94  soft-tissue]
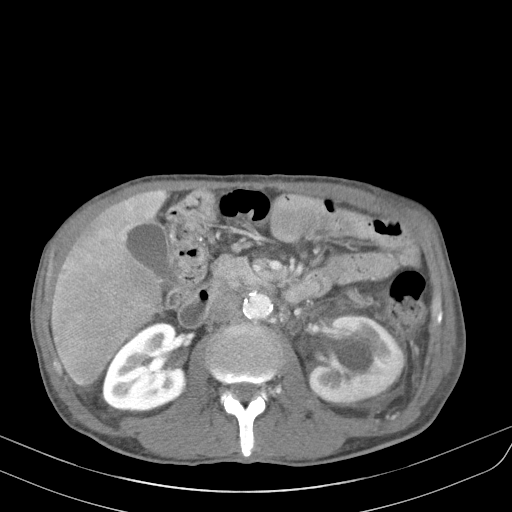
[im 70/94  lung]
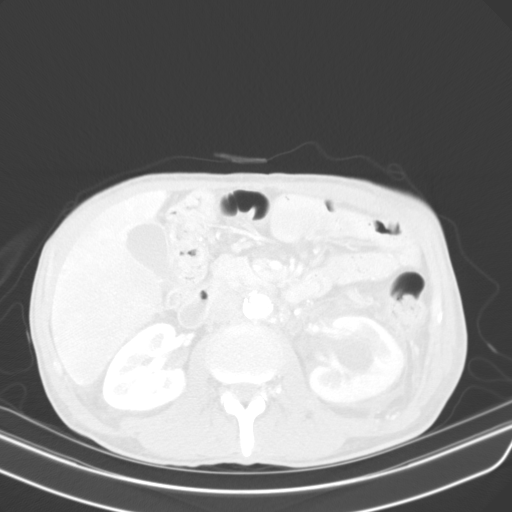
[im 76/94  soft-tissue]
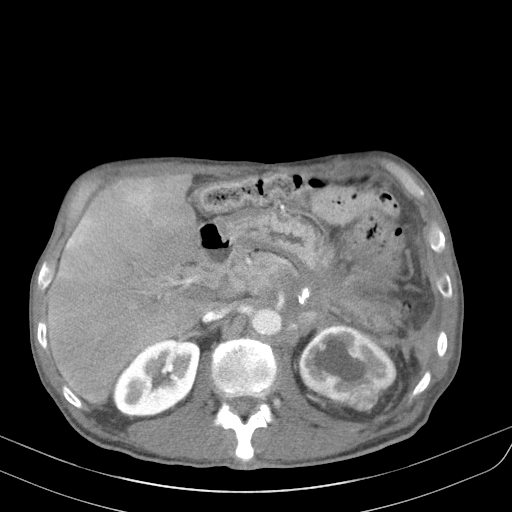
[im 76/94  lung]
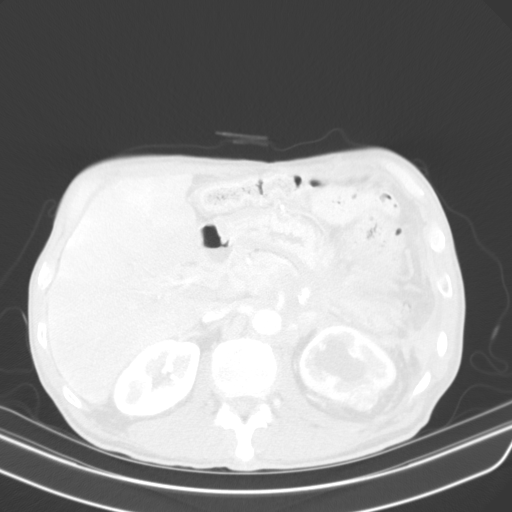
[im 76/94  bone]
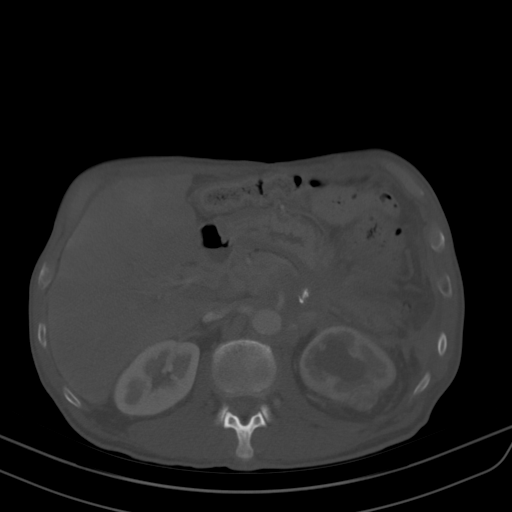
[im 82/94  lung]
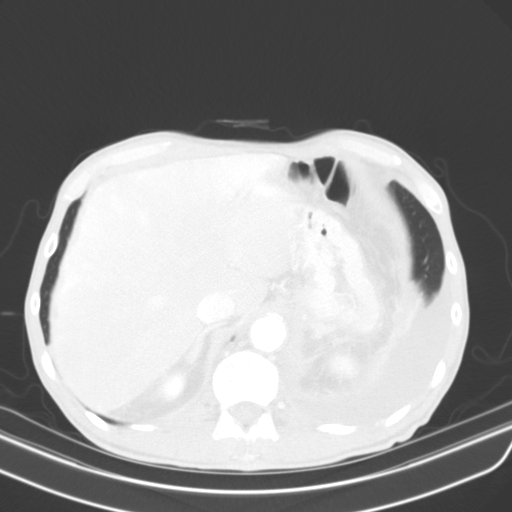
[im 88/94  soft-tissue]
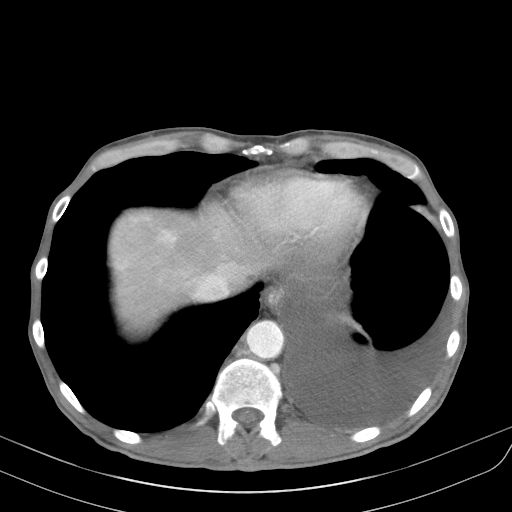
[im 88/94  lung]
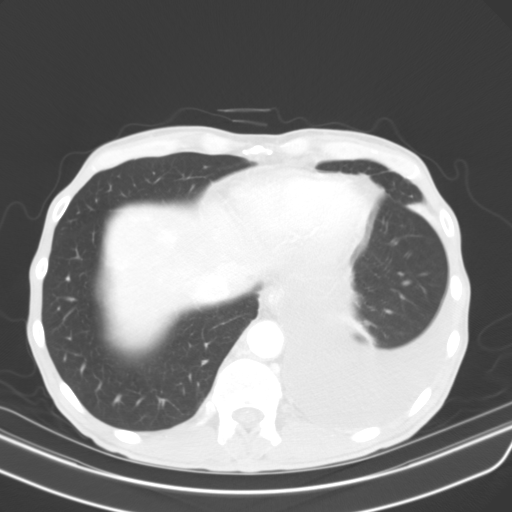

[11 of 32 positions shown; findings below may reference images not displayed]

FINDINGS: Lower chest: Left pleural effusion with associated atelectasis.
Native coronary calcifications.

Hepatobiliary: Mottled attenuation/enhancement throughout the liver
parenchyma, new from the comparison. New 11 mm hypodense lesion on
the lateral margin of the right liver, with delayed internal
enhancement. Redemonstration of hypodense lesion of segment [DATE],
which appears larger than the comparison CT study now measuring 31
mm compared 0 17 mm previously

Pancreas: Redemonstration of pancreatic carcinoma involving the
body/tail the pancreas. Compare to the prior CT there is increasing
low-density soft tissue centered at the pancreas, with progressive
centripetal involvement of the adjacent structures which include
posterior wall of the stomach, the fat planes of the retroperitoneum
at this level, and the adjacent vasculature. Celiac artery is
attenuated just after the origin, with potential occlusion of the
common hepatic artery. Arteries in the hilum of the liver maintained
patency, potentially secondary to collateral flow.

Spleen: Splenectomy

Adrenals/Urinary Tract: Irregular thickening of the left adrenal
gland with hyperenhancement. Right adrenal gland unremarkable.

Right kidney within normal limits without hydronephrosis. Small
benign cyst on the posterior cortex.

Decreased attenuation/enhancement of the left kidney compared to the
right secondary to compromise of the left renal artery by tumor
involvement. Developing left-sided hydronephrosis with dilated left
ureter. Abnormal soft tissue surrounds the left ureter, which is
matted to the anterior surface of the psoas muscle.

Redemonstration irregularly enhancing left renal tumor on the
posterior cortex measuring 2.2 cm on the current study.

Stomach/Bowel: Mucosal enhancement of the stomach within normal
limits. Inseparable soft tissue from the tumor of the pancreas from
the posterior wall of the stomach, progressed from the comparison.

Unremarkable appearance of the proximal duodenum. There is tumor
inseparable from the serosa of the duodenum at the duodenal jejunal
junction, best seen on the coronal images.

No abnormal small bowel dilation or transition point.

No colonic dilation. Colonic diverticula without evidence of acute
diverticulitis.

Increasing soft tissue in the left perinephric space and extending
along the left pericolic gutter. Redemonstration of nodularity
within the pelvis (image 55 of series 2. Small nodularity within the
mesentery planes.

Vascular/Lymphatic: Increasing soft tissue along the anterior aspect
of the aorta near the pancreas. Soft tissue extends to the right
aspect of the aorta along the anterior right renal artery. Soft
tissue extends inferior along the left aspect of the aorta,
contributing to attenuation/narrowing of the left renal artery.

Soft tissue contributes to significant stenosis or occlusion of the
celiac artery beyond the origin, near the common hepatic artery.
Circumferential tissue of the superior mesenteric artery which is
narrowed at the origin.

IMA remains patent.

Left renal vein is compromised, with collateral draining veins
including gonadal vein which contributes to a left varicocele.

Portal vein is patent at the liver hilum, though the splenic vein is
occluded.

Reproductive: Calcifications of the prostate. Transverse diameter
prostate measures 4.4 cm

Other: Increasing soft tissue associated with the left inferior
epigastric vasculature of the rectus musculature. Low volume pelvic
ascites

Musculoskeletal: No acute displaced fracture.
IMPRESSION: CT again demonstrates local progression and metastatic progression
of pancreatic adenocarcinoma, including:

-increasing tumor burden at the pancreas, with serosal involvement
of the posterior stomach and fourth portion of the duodenum

-progressive vascular involvement including the celiac artery/common
hepatic artery, with possible occlusion, superior mesenteric artery
with stenosis, and left renal artery with stenosis contributing to
decreased left-sided kidney perfusion.

-splenic vein occlusion, left renal vein occlusion, which
contributes to left-sided varicocele

-increasing retroperitoneal extension along the left psoas muscle,
contributing to partial left ureteral occlusion and developing left
hydronephrosis

-increasing retroperitoneal nodularity/inflammation, particularly in
the left perinephric space, left pericolic gutter, and the pelvis.

-increasing extraperitoneal disease of the left rectus musculature
surrounding the inferior epigastric vasculature

-new metastasis of the right liver along the margin, with increasing
size of tumor of segment [DATE]

-small volume pelvic ascites, presumably malignant.

Mottled appearance of the liver is likely perfusion related given
the compromise of celiac artery/common hepatic artery.

Left-sided pleural effusion.

Left renal cell carcinoma
